# Patient Record
Sex: Female | Born: 1940 | Race: White | Hispanic: No | Marital: Married | Smoking: Never smoker
Health system: Southern US, Community
[De-identification: ages and names within clinical notes are randomized; demographics above are authoritative.]

## PROBLEM LIST (undated history)

## (undated) DIAGNOSIS — E119 Type 2 diabetes mellitus without complications: Secondary | ICD-10-CM

## (undated) DIAGNOSIS — E785 Hyperlipidemia, unspecified: Secondary | ICD-10-CM

## (undated) DIAGNOSIS — F325 Major depressive disorder, single episode, in full remission: Secondary | ICD-10-CM

## (undated) DIAGNOSIS — C801 Malignant (primary) neoplasm, unspecified: Secondary | ICD-10-CM

## (undated) HISTORY — DX: Hyperlipidemia, unspecified: E78.5

## (undated) HISTORY — DX: Type 2 diabetes mellitus without complications: E11.9

## (undated) HISTORY — DX: Major depressive disorder, single episode, in full remission: F32.5

---

## 2007-10-29 ENCOUNTER — Ambulatory Visit: Payer: Self-pay | Admitting: Internal Medicine

## 2007-12-17 ENCOUNTER — Encounter: Admission: RE | Admit: 2007-12-17 | Discharge: 2007-12-17 | Payer: Self-pay | Admitting: Internal Medicine

## 2008-01-09 ENCOUNTER — Ambulatory Visit: Payer: Self-pay | Admitting: Internal Medicine

## 2008-01-09 ENCOUNTER — Other Ambulatory Visit: Admission: RE | Admit: 2008-01-09 | Discharge: 2008-01-09 | Payer: Self-pay | Admitting: Internal Medicine

## 2008-01-09 ENCOUNTER — Encounter: Payer: Self-pay | Admitting: Internal Medicine

## 2008-01-09 DIAGNOSIS — M899 Disorder of bone, unspecified: Secondary | ICD-10-CM | POA: Insufficient documentation

## 2008-01-09 DIAGNOSIS — M949 Disorder of cartilage, unspecified: Secondary | ICD-10-CM

## 2008-12-26 ENCOUNTER — Encounter: Admission: RE | Admit: 2008-12-26 | Discharge: 2008-12-26 | Payer: Self-pay | Admitting: Internal Medicine

## 2009-01-05 ENCOUNTER — Ambulatory Visit: Payer: Self-pay | Admitting: Internal Medicine

## 2009-01-05 DIAGNOSIS — E785 Hyperlipidemia, unspecified: Secondary | ICD-10-CM

## 2009-01-05 DIAGNOSIS — T887XXA Unspecified adverse effect of drug or medicament, initial encounter: Secondary | ICD-10-CM | POA: Insufficient documentation

## 2009-01-05 HISTORY — DX: Hyperlipidemia, unspecified: E78.5

## 2009-01-05 LAB — CONVERTED CEMR LAB: Vit D, 25-Hydroxy: 49 ng/mL (ref 30–89)

## 2009-01-07 LAB — CONVERTED CEMR LAB
ALT: 32 units/L (ref 0–35)
Alkaline Phosphatase: 71 units/L (ref 39–117)
Basophils Relative: 0.6 % (ref 0.0–3.0)
CO2: 26 meq/L (ref 19–32)
Calcium: 9.5 mg/dL (ref 8.4–10.5)
Chloride: 104 meq/L (ref 96–112)
Cholesterol: 256 mg/dL — ABNORMAL HIGH (ref 0–200)
Creatinine, Ser: 0.9 mg/dL (ref 0.4–1.2)
Eosinophils Absolute: 0.3 10*3/uL (ref 0.0–0.7)
Lymphocytes Relative: 33.2 % (ref 12.0–46.0)
Lymphs Abs: 2.2 10*3/uL (ref 0.7–4.0)
MCHC: 34.4 g/dL (ref 30.0–36.0)
Monocytes Relative: 9.1 % (ref 3.0–12.0)
Neutro Abs: 3.5 10*3/uL (ref 1.4–7.7)
Neutrophils Relative %: 53.2 % (ref 43.0–77.0)
RBC: 4.63 M/uL (ref 3.87–5.11)
RDW: 12 % (ref 11.5–14.6)
Sodium: 142 meq/L (ref 135–145)
Total Bilirubin: 1.1 mg/dL (ref 0.3–1.2)
Total CHOL/HDL Ratio: 5
Total Protein: 7.3 g/dL (ref 6.0–8.3)
VLDL: 29.6 mg/dL (ref 0.0–40.0)
WBC: 6.6 10*3/uL (ref 4.5–10.5)

## 2009-01-14 ENCOUNTER — Ambulatory Visit: Payer: Self-pay | Admitting: Internal Medicine

## 2009-01-28 ENCOUNTER — Ambulatory Visit: Payer: Self-pay | Admitting: Internal Medicine

## 2009-07-27 ENCOUNTER — Encounter: Payer: Self-pay | Admitting: Internal Medicine

## 2009-09-08 ENCOUNTER — Encounter: Payer: Self-pay | Admitting: Internal Medicine

## 2009-11-17 ENCOUNTER — Telehealth: Payer: Self-pay | Admitting: Internal Medicine

## 2010-01-01 ENCOUNTER — Ambulatory Visit: Payer: Self-pay | Admitting: Internal Medicine

## 2010-01-01 LAB — CONVERTED CEMR LAB
Basophils Absolute: 0 10*3/uL (ref 0.0–0.1)
Bilirubin, Direct: 0.1 mg/dL (ref 0.0–0.3)
Cholesterol: 254 mg/dL — ABNORMAL HIGH (ref 0–200)
Direct LDL: 162.3 mg/dL
Glucose, Bld: 122 mg/dL — ABNORMAL HIGH (ref 70–99)
HCT: 43 % (ref 36.0–46.0)
MCV: 92.2 fL (ref 78.0–100.0)
Neutro Abs: 3.9 10*3/uL (ref 1.4–7.7)
Neutrophils Relative %: 55 % (ref 43.0–77.0)
Platelets: 240 10*3/uL (ref 150.0–400.0)
Potassium: 5 meq/L (ref 3.5–5.1)
RBC: 4.67 M/uL (ref 3.87–5.11)
RDW: 12.3 % (ref 11.5–14.6)
Triglycerides: 247 mg/dL — ABNORMAL HIGH (ref 0.0–149.0)
Vit D, 25-Hydroxy: 39 ng/mL (ref 30–89)

## 2010-04-25 LAB — CONVERTED CEMR LAB
ALT: 39 units/L — ABNORMAL HIGH (ref 0–35)
AST: 40 units/L — ABNORMAL HIGH (ref 0–37)
Albumin: 3.9 g/dL (ref 3.5–5.2)
Alkaline Phosphatase: 70 units/L (ref 39–117)
BUN: 14 mg/dL (ref 6–23)
Basophils Absolute: 0 10*3/uL (ref 0.0–0.1)
Basophils Relative: 0.6 % (ref 0.0–3.0)
Bilirubin, Direct: 0.1 mg/dL (ref 0.0–0.3)
CO2: 30 meq/L (ref 19–32)
Calcium: 9.7 mg/dL (ref 8.4–10.5)
Chloride: 104 meq/L (ref 96–112)
Cholesterol: 259 mg/dL (ref 0–200)
Creatinine, Ser: 0.8 mg/dL (ref 0.4–1.2)
Direct LDL: 162.6 mg/dL
Eosinophils Absolute: 0.3 10*3/uL (ref 0.0–0.7)
Eosinophils Relative: 3.8 % (ref 0.0–5.0)
GFR calc Af Amer: 92 mL/min
GFR calc non Af Amer: 76 mL/min
Glucose, Bld: 105 mg/dL — ABNORMAL HIGH (ref 70–99)
HCT: 45.7 % (ref 36.0–46.0)
HDL: 49.7 mg/dL (ref >39.0)
Hemoglobin: 15.5 g/dL — ABNORMAL HIGH (ref 12.0–15.0)
Lymphocytes Relative: 32.9 % (ref 12.0–46.0)
MCHC: 33.8 g/dL (ref 30.0–36.0)
MCV: 92.5 fL (ref 78.0–100.0)
Monocytes Absolute: 0.8 10*3/uL (ref 0.1–1.0)
Monocytes Relative: 9.7 % (ref 3.0–12.0)
Neutro Abs: 4.4 10*3/uL (ref 1.4–7.7)
Neutrophils Relative %: 53 % (ref 43.0–77.0)
Platelets: 236 10*3/uL (ref 150–400)
Potassium: 4.7 meq/L (ref 3.5–5.1)
RBC: 4.94 M/uL (ref 3.87–5.11)
RDW: 12.4 % (ref 11.5–14.6)
Sodium: 142 meq/L (ref 135–145)
TSH: 2.64 microintl units/mL (ref 0.35–5.50)
Total Bilirubin: 0.9 mg/dL (ref 0.3–1.2)
Total CHOL/HDL Ratio: 5.2
Total Protein: 7.7 g/dL (ref 6.0–8.3)
Triglycerides: 167 mg/dL — ABNORMAL HIGH (ref 0–149)
VLDL: 33 mg/dL (ref 0–40)
WBC: 8.2 10*3/uL (ref 4.5–10.5)

## 2010-04-27 NOTE — Assessment & Plan Note (Signed)
Summary: CPX (PT WILL COME IN FASTING) // RS   Vital Signs:  Patient profile:   70 year old female Height:      62 inches Weight:      138 pounds BMI:     25.33 Temp:     98.2 degrees F oral Pulse rate:   72 / minute Resp:     14 per minute BP sitting:   110 / 80  (left arm)  Vitals Entered By: Willy Eddy, LPN (January 01, 2010 10:08 AM) CC: ANNUAL VISIT FOR DISEASE MANAGEMENT-FASTING THIS AM Is Patient Diabetic? No   Primary Care Sona Nations:  Stacie Glaze MD  CC:  ANNUAL VISIT FOR DISEASE MANAGEMENT-FASTING THIS AM.  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 services that are appropriate to their age and was given guidance on diet exercize  and weight management  I have personally reviewed the Medicare Annual Wellness questionnaire and have noted 1.   The patient's medical and social history 2.   Their use of alcohol, tobacco or illicit drugs 3.   Their current medications and supplements 4.   The patient's functional ability including ADL's, fall risks, home safety risks and hearing or visual             impairment. 5.   Diet and physical activities 6.   Evidence for depression or mood disorders The patients weight, height, BMI and visual acuity have been recorded in the chart I have made referrals, counseling and provided education to the patient based review of the above and I have provided the pt with a written personalized care plan for preventive services.      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 1 Once Daily 2)  B-12 250 Mcg  Tabs (Cyanocobalamin) .Marland Kitchen.. 1 Once Daily 3)  Fish Oil 1200 Mg  Caps (Omega-3 Fatty Acids) .Marland Kitchen.. 1 Once Daily 4)  Biotin 5000 5 Mg Caps (Biotin) .... 2500 Once Daily 5)  Bayer Aspirin Ec Low Dose 81 Mg Tbec (Aspirin) .Marland Kitchen.. 1 Once Daily 6)  Sleep Aide .... Q Hs As Needed 7)  Multivitamins  Caps (Multiple Vitamin) .Marland Kitchen.. 1 Once Daily 8)  Vitamin B-12 100 Mcg Tabs (Cyanocobalamin) .Marland Kitchen.. 1 Once Daily 9)  Caltrate 600+d Plus 600-400 Mg-Unit Tabs (Calcium Carbonate-Vit D-Min) .... One Two Times A Day 10)  Vitamin D 1000 Unit Tabs (Cholecalciferol) .... One By Mouth Daily  Current Medications (verified): 1)  B-100 Complex   Tabs (Vitamins-Lipotropics) .Marland Kitchen.. 1 Once Daily 2)  B-12 250 Mcg  Tabs (Cyanocobalamin) .Marland Kitchen.. 1 Once Daily 3)  Fish Oil 1200 Mg  Caps (Omega-3 Fatty Acids) .Marland Kitchen.. 1 Once Daily 4)  Biotin 5000 5 Mg Caps (Biotin) .... 2500 Once Daily 5)  Bayer Aspirin Ec Low Dose 81 Mg Tbec (Aspirin) .Marland Kitchen.. 1 Once Daily 6)  Sleep Aide .... Q Hs As Needed 7)  Multivitamins  Caps (Multiple Vitamin) .Marland Kitchen.. 1 Once Daily 8)  Vitamin B-12 100 Mcg Tabs (Cyanocobalamin) .Marland Kitchen.. 1 Once Daily 9)  Caltrate 600+d Plus 600-400 Mg-Unit Tabs (Calcium Carbonate-Vit D-Min) .... One Two Times A Day 10)  Vitamin D 1000 Unit Tabs (Cholecalciferol) .... One By Mouth Daily  Allergies (verified): No Known Drug Allergies  Past History:  Family History: Last updated: 10/29/2007 Family History of Arthritis Family History Breast cancer 1st  degree relative <50 Family History Hypertension  Social History: Last updated: 10/29/2007 Occupation:homebound teacher Never Smoked Alcohol use-yes Married  Risk Factors: Smoking Status: never (01/01/2010) Passive Smoke Exposure: no (01/01/2010)  Past medical, surgical, family and social histories (including risk factors) reviewed, and no changes noted (except as noted below).  Past Medical History: Reviewed history from 10/29/2007 and no changes required. Unremarkable  Past Surgical History: Reviewed history from 10/29/2007 and no changes required. Tubal ligation Tonsillectomy  Family History: Reviewed history from 10/29/2007 and no changes required. Family History of Arthritis Family History Breast cancer 1st degree relative <50 Family History Hypertension  Social History: Reviewed history from 10/29/2007 and no changes required. Occupation:homebound teacher Never Smoked Alcohol use-yes Married  Review of Systems  The patient denies anorexia, fever, weight loss, weight gain, vision loss, decreased hearing, hoarseness, chest pain, syncope, dyspnea on exertion, peripheral edema, prolonged cough, headaches, hemoptysis, abdominal pain, melena, hematochezia, severe indigestion/heartburn, hematuria, incontinence, genital sores, muscle weakness, suspicious skin lesions, transient blindness, difficulty walking, depression, unusual weight change, abnormal bleeding, enlarged lymph nodes, angioedema, breast masses, and testicular masses.         Flu Vaccine Consent Questions     Do you have a history of severe allergic reactions to this vaccine? no    Any prior history of allergic reactions to egg and/or gelatin? no    Do you have a sensitivity to the preservative Thimersol? no    Do you have a past history of Guillan-Barre Syndrome? no    Do you currently have an acute febrile illness? no    Have you ever had a severe reaction to latex? no    Vaccine information given and  explained to patient? yes    Are you currently pregnant? no    Lot Number:[id number]   Exp Date:09/25/2010   Site Given  Left Deltoid IM     Physical Exam  General:  Well-developed,well-nourished,in no acute distress; alert,appropriate and cooperative throughout examination Head:  normocephalic and atraumatic.   Eyes:  pupils equal and pupils reactive to light.   Ears:  R ear normal and L ear normal.   Nose:  no external deformity and no nasal discharge.   Mouth:  good dentition and no dental plaque.   Neck:  No deformities, masses, or tenderness noted. Breasts:  skin/areolae normal, no masses, and no abnormal thickening.   Lungs:  Normal respiratory effort, chest expands symmetrically. Lungs are clear to auscultation, no crackles or wheezes. Heart:  normal rate and regular rhythm.   Abdomen:  soft and non-tender.     Impression & Recommendations:  Problem # 1:  PHYSICAL EXAMINATION (ICD-V70.0)  The pt was asked about all immunizations, health maint. services that are appropriate to their age and was  given guidance on diet exercize  and weight management  Mammogram: normal (12/29/2008) Pap smear: NEGATIVE FOR INTRAEPITHELIAL LESIONS OR MALIGNANCY. (01/09/2008) Colonoscopy: DONE (01/28/2009) Bone Density: abnormal (01/09/2008) Td Booster: Td (10/29/2007)   Flu Vax: Fluvax 3+ (01/01/2010)   Pneumovax: Pneumovax (Medicare) (01/01/2010) Chol: 256 (01/05/2009)   HDL: 52.30 (01/05/2009)   LDL: DEL (01/09/2008)   TG: 148.0 (01/05/2009) TSH: 2.64 (01/09/2008)   Next mammogram due:: 12/2009 (01/05/2009) Next Colonoscopy due:: 01/2019 (01/28/2009) Next Bone Density due:: 12/2009 (01/05/2009)  Discussed using sunscreen, use of alcohol, drug use, self breast exam, routine dental care, routine eye care, schedule for GYN exam, routine physical exam, seat belts, multiple vitamins, osteoporosis prevention, adequate calcium intake in diet, recommendations for immunizations, mammograms and Pap  smears.  Discussed exercise and checking cholesterol.  Discussed gun safety, safe sex, and contraception.  Orders: Medicare -1st Annual Wellness Visit [phone])  Problem # 2:  HYPERLIPIDEMIA, MILD (ICD-272.4) add kril oil Labs Reviewed: SGOT: 37 (01/05/2009)   SGPT: 32 (01/05/2009)   HDL:52.30 (01/05/2009), 49.7 (01/09/2008)  LDL:DEL (01/09/2008)  Chol:256 (01/05/2009), 259 (01/09/2008)  Trig:148.0 (01/05/2009), 167 (01/09/2008)  Orders: TLB-Lipid Panel (80061-LIPID)  Complete Medication List: 1)  B-100 Complex Tabs (Vitamins-lipotropics) .Marland Kitchen.. 1 once daily 2)  Fish Oil 1200 Mg Caps (Omega-3 fatty acids) .Marland Kitchen.. 1 once daily 3)  Biotin 5000 5 Mg Caps (Biotin) .... 2500 once daily 4)  Bayer Aspirin Ec Low Dose 81 Mg Tbec (Aspirin) .Marland Kitchen.. 1 once daily 5)  Sleep Aide  .... Q hs as needed 6)  Multivitamins Caps (Multiple vitamin) .Marland Kitchen.. 1 once daily 7)  Vitamin B-12 100 Mcg Tabs (Cyanocobalamin) .Marland Kitchen.. 1 once daily 8)  Caltrate 600+d Plus 600-400 Mg-unit Tabs (Calcium carbonate-vit d-min) .... One two times a day 9)  Vitamin D 1000 Unit Tabs (Cholecalciferol) .... One by mouth daily  Other Orders: Flu Vaccine 36yrs + MEDICARE PATIENTS [phone]) Administration Flu vaccine - MCR (M8413) Pneumococcal Vaccine (24401) Admin 1st Vaccine (02725) T-Bone Densitometry [phone]) TLB-CMP (Comprehensive Metabolic Pnl) (80053-COMP) T-Vitamin D (25-Hydroxy) (03474-25956) TLB-CBC Platelet - w/Differential (85025-CBCD)  Patient Instructions: 1)  I have given this pt her personalized plan ande placed a copy in the EMR. 2)  Please schedule a follow-up appointment in 1 year. for welness exam     Immunizations Administered:  Pneumonia Vaccine:    Vaccine Type: Pneumovax (Medicare)    Site: right deltoid    Mfr: Merck    Dose: 0.5 ml    Route: IM    Given by: Willy Eddy, LPN    Exp. Date: 04/18/2011    Lot #: [id number]    VIS given: 03/02/09 version given January 01, 2010.   Prevention & Chronic  Care Immunizations   Influenza vaccine: Fluvax 3+  (01/01/2010)   Influenza vaccine deferral: patient declined  (01/05/2009)   Influenza vaccine due: 11/27/2010    Tetanus booster: 10/29/2007: Td   Tetanus booster due: 10/28/2017    Pneumococcal vaccine: Pneumovax (Medicare)  (01/01/2010)   Pneumococcal vaccine deferral: patient declined  (01/05/2009)    H. zoster vaccine: Not documented    Immunization comments: pt to schedule zostra vix  Colorectal Screening   Hemoccult: Not documented    Colonoscopy: DONE  (01/28/2009)   Colonoscopy due: 01/2019  Other Screening   Pap smear: NEGATIVE FOR INTRAEPITHELIAL LESIONS OR MALIGNANCY.  (01/09/2008)   Pap smear due: 12/2010    Mammogram: normal  (12/29/2008)   Mammogram action/deferral: Ordered  (01/01/2010)   Mammogram due: 12/2009  DXA bone density scan: abnormal  (01/09/2008)   DXA bone density action/deferral: Ordered  (01/01/2010)   DXA scan due: 12/2009    Smoking status: never  (01/01/2010)  Lipids   Total Cholesterol: 256  (01/05/2009)   Lipid panel action/deferral: Lipid Panel ordered   LDL: DEL  (01/09/2008)   LDL Direct: 174.8  (01/05/2009)   HDL: 52.30  (01/05/2009)   Triglycerides: 148.0  (01/05/2009)    SGOT (AST): 37  (01/05/2009)   BMP action: Ordered   SGPT (ALT): 32  (01/05/2009) CMP ordered    Alkaline phosphatase: 71  (01/05/2009)   Total bilirubin: 1.1  (01/05/2009)    Lipid flowsheet reviewed?: Yes   Progress toward LDL goal: Improved  Self-Management Support :    Patient will work on the following items until the next clinic visit to reach self-care goals:     Medications and monitoring: take my medicines every day  (01/01/2010)     Eating: eat foods that are low in salt, eat fruit for snacks and desserts  (01/01/2010)     Activity: take a 30 minute walk every day  (01/01/2010)    Lipid self-management support: Lipid monitoring log  (01/01/2010)    Nursing Instructions: Schedule  screening mammogram (see order) Schedule screening DXA bone density scan (see order)   Appended Document: Orders Update    Clinical Lists Changes  Orders: Added new Service order of Venipuncture (16109) - Signed Added new Service order of Specimen Handling (60454) - Signed Added new Test order of TLB-BMP (Basic Metabolic Panel-BMET) (80048-METABOL) - Signed Added new Test order of TLB-Hepatic/Liver Function Pnl (80076-HEPATIC) - Signed

## 2010-04-27 NOTE — Progress Notes (Signed)
Summary: REQ FOR AM CPX APPT  Phone Note Call from Patient   Caller: Patient Summary of Call: Pt called in for CPX appt that she normally has done in October..... However, there are no morning appts and pt will need to come in fasting for labwork (Medicare).... Can you advise an available morning appt in October that pt can come in for cpx/labwrk?   Pt can be reached at [phone].  Initial call taken by: Debbra Riding,  November 17, 2009 1:49 PM  Follow-up for Phone Call        you have 2 choices,- she can have 10-7 at 9:45 and 10am(block 30 minutes) or 11/3 at 9:15 and 9;45(block those 2 spaces for 30 minutes Follow-up by: Willy Eddy, LPN,  November 17, 2009 2:14 PM  Additional Follow-up for Phone Call Additional follow up Details #1::        Spoke with pt and appt was scheduled for 10/7 at 9:45am..... following slot blocked per Rushie Goltz, LPN.  Additional Follow-up by: Debbra Riding,  November 17, 2009 2:44 PM

## 2010-04-27 NOTE — Letter (Signed)
Summary: Medicare Wellness Questionnaire  Medicare Wellness Questionnaire   Imported By: Georgian Co 01/01/2010 11:15:02  _____________________________________________________________________  External Attachment:    Type:   Image     Comment:   External Document

## 2012-05-24 ENCOUNTER — Telehealth: Payer: Self-pay | Admitting: Internal Medicine

## 2012-05-24 ENCOUNTER — Encounter: Payer: Self-pay | Admitting: Family Medicine

## 2012-05-24 ENCOUNTER — Ambulatory Visit (INDEPENDENT_AMBULATORY_CARE_PROVIDER_SITE_OTHER): Payer: Medicare Other | Admitting: Family Medicine

## 2012-05-24 VITALS — BP 122/70 | HR 100 | Temp 97.6°F | Wt 136.0 lb

## 2012-05-24 DIAGNOSIS — H60399 Other infective otitis externa, unspecified ear: Secondary | ICD-10-CM

## 2012-05-24 DIAGNOSIS — H60392 Other infective otitis externa, left ear: Secondary | ICD-10-CM

## 2012-05-24 MED ORDER — NEOMYCIN-POLYMYXIN-HC 3.5-10000-1 OT SOLN: 5.0000 [drp] | Freq: Four times a day (QID) | OTIC | Status: DC

## 2012-05-24 NOTE — Telephone Encounter (Signed)
[facility] Triage Call Report Triage Record Num: [id number] Operator: Ether Griffins Patient Name: Gabrielle Robinson Call Date & Time: 05/23/2012 5:19:41PM Patient Phone: [phone] PCP: Darryll Capers Patient Gender: Female PCP Fax : [phone] Patient DOB: 1940-08-19 [facility] Name[facility] Reason for Call: Caller: [name]/Patient; PCP: Darryll Capers (Adults only); CB#: [phone]; Calling about possible ear infection--tenderness in left ear/face,ear feels "stopped up". Onset 05/23/12. Taking Acetaminophen with good effect. Guideline: ZDG:LOVFIEPP. Disposition: See Provider Within 24 Hours. Reason for Disposition: Constant or intermittent dull earache,throbbing in ear or feeling of fullness;may interfere with sleep or ability to carry out normal activities. Appt scheduled on 05/24/12 @ 1030 with Dr Clent Ridges. Home care advice given. Protocol(s) Used: Ear: Symptoms Recommended Outcome per Protocol: See Provider within 24 hours Reason for Outcome: Constant or intermittent dull earache, throbbing in ear(s) or feeling of fullness; may interfere with sleep or ability to carry out normal activities Care Advice: A warm washcloth or heating pad set on low to the affected ear may help relieve the discomfort. May apply for 15 to 20 minutes, 3 to 4 times a day. ~ Resting or sleeping with head elevated, such as semi-reclining in a recliner, may help reduce inner ear pressure and discomfort. ~ ~ SYMPTOM / CONDITION MANAGEMENT ~ CAUTIONS Analgesic/Antipyretic Advice - Acetaminophen: Consider acetaminophen as directed on label or by pharmacist/provider for pain or fever PRECAUTIONS: - Use if there is no history of liver disease, alcoholism, or intake of three or more alcohol drinks per day - Only if approved by provider during pregnancy or when breastfeeding - During pregnancy, acetaminophen should not be taken more than 3 consecutive days without telling provider - Do not exceed recommended dose  or frequency ~ Speak with provider as soon as possible if having: - discharge from ear that does not look like earwax or that has bad odor - increased redness or swelling of external ear - worsening pain - new or worsening dizziness - or unsteadiness. ~

## 2012-05-24 NOTE — Progress Notes (Signed)
  Subjective:    Patient ID: Gabrielle Robinson, female    DOB: 1941/02/27, 72 y.o.   MRN: [id number]  HPI Here for 2 days of pain in the left ear. No other URI symptoms. No recent swimming.    Review of Systems  Constitutional: Negative.   HENT: Positive for ear pain. Negative for hearing loss, congestion, neck pain, postnasal drip, sinus pressure and ear discharge.   Eyes: Negative.   Respiratory: Negative.        Objective:   Physical Exam  Constitutional: She appears well-developed and well-nourished.  HENT:  Head: Normocephalic and atraumatic.  Right Ear: External ear normal.  Mouth/Throat: Oropharynx is clear and moist. No oropharyngeal exudate.  Right ear canal is red and swollen, the TM is clear   Eyes: Conjunctivae are normal.  Neck: No thyromegaly present.  Lymphadenopathy:    She has no cervical adenopathy.          Assessment & Plan:  Use Cortisporin drops prn, keep water out of the ear, try warm compresses

## 2012-09-27 ENCOUNTER — Telehealth: Payer: Self-pay | Admitting: Internal Medicine

## 2012-09-27 NOTE — Telephone Encounter (Signed)
OnCall note: Called by NP with [employer] - wanted to let Dr. Lovell Sheehan know that pt had a U/A with 2+ Glucose and she has a FH with DM.

## 2012-10-01 NOTE — Telephone Encounter (Signed)
Need to be see for DM scree may appoint with [facility]

## 2012-10-01 NOTE — Telephone Encounter (Signed)
Talked with husband and asked him to have pt call and maek aov with [facility] per dr Lovell Sheehan

## 2013-03-18 ENCOUNTER — Encounter: Payer: Medicare Other | Admitting: Family Medicine

## 2013-03-18 ENCOUNTER — Encounter: Payer: Self-pay | Admitting: Family Medicine

## 2013-03-18 ENCOUNTER — Ambulatory Visit (INDEPENDENT_AMBULATORY_CARE_PROVIDER_SITE_OTHER): Payer: Medicare Other | Admitting: Family Medicine

## 2013-03-18 VITALS — BP 120/60 | HR 98 | Temp 99.0°F | Wt 130.0 lb

## 2013-03-18 DIAGNOSIS — R7309 Other abnormal glucose: Secondary | ICD-10-CM

## 2013-03-18 DIAGNOSIS — R739 Hyperglycemia, unspecified: Secondary | ICD-10-CM

## 2013-03-18 DIAGNOSIS — E785 Hyperlipidemia, unspecified: Secondary | ICD-10-CM

## 2013-03-18 LAB — BASIC METABOLIC PANEL
CO2: 29 mEq/L (ref 19–32)
Calcium: 9.7 mg/dL (ref 8.4–10.5)
Chloride: 98 mEq/L (ref 96–112)
Creatinine, Ser: 0.8 mg/dL (ref 0.4–1.2)
GFR: 73.58 mL/min (ref >60.00)
Glucose, Bld: 264 mg/dL — ABNORMAL HIGH (ref 70–99)
Sodium: 136 mEq/L (ref 135–145)

## 2013-03-18 LAB — CBC WITH DIFFERENTIAL/PLATELET
Eosinophils Absolute: 0.1 10*3/uL (ref 0.0–0.7)
Eosinophils Relative: 2 % (ref 0.0–5.0)
MCV: 90.4 fl (ref 78.0–100.0)
Monocytes Absolute: 0.8 10*3/uL (ref 0.1–1.0)
Platelets: 258 10*3/uL (ref 150.0–400.0)
RBC: 5.21 Mil/uL — ABNORMAL HIGH (ref 3.87–5.11)
WBC: 7.3 10*3/uL (ref 4.5–10.5)

## 2013-03-18 LAB — POCT URINALYSIS DIPSTICK
Bilirubin, UA: NEGATIVE
Leukocytes, UA: NEGATIVE
Nitrite, UA: NEGATIVE
Protein, UA: NEGATIVE
pH, UA: 5.5

## 2013-03-18 LAB — HEPATIC FUNCTION PANEL
AST: 35 U/L (ref 0–37)
Alkaline Phosphatase: 79 U/L (ref 39–117)
Bilirubin, Direct: 0 mg/dL (ref 0.0–0.3)
Total Bilirubin: 0.6 mg/dL (ref 0.3–1.2)
Total Protein: 7.7 g/dL (ref 6.0–8.3)

## 2013-03-18 LAB — TSH: TSH: 2.49 u[IU]/mL (ref 0.35–5.50)

## 2013-03-18 NOTE — Progress Notes (Signed)
   Subjective:    Patient ID: Gabrielle Robinson, female    DOB: 1941-02-09, 72 y.o.   MRN: [id number]  HPI Here to check if she has diabetes. She feels fine but does note to have a little more thirst lately than usual. No weight changes. A nurse from her insurance company recently saw her at the house and did a fingerstick glucose that she said was "high". She did not tell Gabrielle Robinson what the result was.    Review of Systems  Constitutional: Negative.   Respiratory: Negative.   Cardiovascular: Negative.   Neurological: Negative.        Objective:   Physical Exam  Constitutional: She appears well-developed and well-nourished.  Cardiovascular: Normal rate, regular rhythm, normal heart sounds and intact distal pulses.   Pulmonary/Chest: Effort normal and breath sounds normal.          Assessment & Plan:  Possible diabetes. Get labs today including an A1c.

## 2013-03-19 MED ORDER — METFORMIN HCL 500 MG PO TABS: 500.0000 mg | ORAL_TABLET | Freq: Two times a day (BID) | ORAL | Status: DC

## 2013-03-19 NOTE — Addendum Note (Signed)
Addended by: Aniceto Boss A on: 03/19/2013 01:29 PM   Modules accepted: Orders

## 2013-04-01 ENCOUNTER — Encounter: Payer: Self-pay | Admitting: Internal Medicine

## 2013-04-01 ENCOUNTER — Ambulatory Visit (INDEPENDENT_AMBULATORY_CARE_PROVIDER_SITE_OTHER): Payer: Medicare Other | Admitting: Internal Medicine

## 2013-04-01 VITALS — BP 124/80 | HR 76 | Temp 97.7°F | Resp 16 | Ht 62.0 in | Wt 130.0 lb

## 2013-04-01 DIAGNOSIS — E119 Type 2 diabetes mellitus without complications: Secondary | ICD-10-CM | POA: Insufficient documentation

## 2013-04-01 DIAGNOSIS — E1165 Type 2 diabetes mellitus with hyperglycemia: Secondary | ICD-10-CM

## 2013-04-01 DIAGNOSIS — E1169 Type 2 diabetes mellitus with other specified complication: Principal | ICD-10-CM

## 2013-04-01 DIAGNOSIS — IMO0002 Reserved for concepts with insufficient information to code with codable children: Secondary | ICD-10-CM

## 2013-04-01 MED ORDER — SAXAGLIPTIN-METFORMIN ER 5-500 MG PO TB24: 1.0000 | ORAL_TABLET | Freq: Every day | ORAL | Status: DC

## 2013-04-01 MED ORDER — GLUCOSE BLOOD VI STRP: ORAL_STRIP | Status: DC

## 2013-04-01 NOTE — Progress Notes (Signed)
   Subjective:    Patient ID: Gabrielle Robinson, female    DOB: Mar 24, 1941, 73 y.o.   MRN: [id number]  HPI New diagnosis of DM Has lost weight No hypoglycemia noted Started metformin and has had diarhea     Review of Systems  Constitutional: Negative for activity change, appetite change and fatigue.  HENT: Negative for congestion, ear pain, postnasal drip and sinus pressure.   Eyes: Negative for redness and visual disturbance.  Respiratory: Negative for cough, shortness of breath and wheezing.   Gastrointestinal: Negative for abdominal pain and abdominal distention.  Genitourinary: Negative for dysuria, frequency and menstrual problem.  Musculoskeletal: Negative for arthralgias, joint swelling, myalgias and neck pain.  Skin: Negative for rash and wound.  Neurological: Negative for dizziness, weakness and headaches.  Hematological: Negative for adenopathy. Does not bruise/bleed easily.  Psychiatric/Behavioral: Negative for sleep disturbance and decreased concentration.   History reviewed. No pertinent past medical history.  History   Social History  . Marital Status: Married    Spouse Name: N/A    Number of Children: N/A  . Years of Education: N/A   Occupational History  . Not on file.   Social History Main Topics  . Smoking status: Never Smoker   . Smokeless tobacco: Never Used  . Alcohol Use: Yes     Comment: occ  . Drug Use: No  . Sexual Activity: Not on file   Other Topics Concern  . Not on file   Social History Narrative  . No narrative on file    History reviewed. No pertinent past surgical history.  No family history on file.  No Known Allergies  Current Outpatient Prescriptions on File Prior to Visit  Medication Sig Dispense Refill  . neomycin-polymyxin-hydrocortisone (CORTISPORIN) otic solution Place 5 drops into the left ear 4 (four) times daily.  10 mL  0   No current facility-administered medications on file prior to visit.    BP 124/80  Pulse  76  Temp(Src) 97.7 F (36.5 C)  Resp 16  Ht 5\' 2"  (1.575 m)  Wt 130 lb (58.968 kg)  BMI 23.77 kg/m2       Objective:   Physical Exam  Constitutional: She is oriented to person, place, and time. She appears well-developed and well-nourished.  HENT:  Head: Normocephalic and atraumatic.  Eyes: Conjunctivae are normal. Pupils are equal, round, and reactive to light.  Neck: Normal range of motion. Neck supple.  Cardiovascular: Normal rate and regular rhythm.   Pulmonary/Chest: Breath sounds normal.  Abdominal: Soft. Bowel sounds are normal.  Musculoskeletal: Normal range of motion.  Neurological: She is alert and oriented to person, place, and time.          Assessment & Plan:  New-onset adult-onset diabetes with an initial hemoglobin A1c of 9.8.  She started metformin 500 mg by mouth twice daily tolerated the medication generally well but did have some loose stools.  We have changed her to controlled release metformin in combination, kombiglyze 5/500 We have given her a One Touch glucometer A referral to nutritional support services Scheduled an A1c prior to one month return to see our nurse practitioner

## 2013-04-01 NOTE — Progress Notes (Signed)
Pre visit review using our clinic review tool, if applicable. No additional management support is needed unless otherwise documented below in the visit note. 

## 2013-04-11 ENCOUNTER — Encounter: Payer: Medicare Other | Attending: Internal Medicine

## 2013-04-11 VITALS — Ht 64.0 in | Wt 128.8 lb

## 2013-04-11 DIAGNOSIS — IMO0002 Reserved for concepts with insufficient information to code with codable children: Secondary | ICD-10-CM | POA: Insufficient documentation

## 2013-04-11 DIAGNOSIS — Z713 Dietary counseling and surveillance: Secondary | ICD-10-CM | POA: Insufficient documentation

## 2013-04-11 DIAGNOSIS — E1169 Type 2 diabetes mellitus with other specified complication: Secondary | ICD-10-CM

## 2013-04-11 DIAGNOSIS — E118 Type 2 diabetes mellitus with unspecified complications: Principal | ICD-10-CM

## 2013-04-11 DIAGNOSIS — E1165 Type 2 diabetes mellitus with hyperglycemia: Secondary | ICD-10-CM | POA: Insufficient documentation

## 2013-04-13 NOTE — Progress Notes (Signed)
Patient was seen on 04/11/13 for the first of a series of three diabetes self-management courses at the [facility].  Current HbA1c: 9.8%  The following learning objectives were met by the patient during this class:  Describe diabetes  State some common risk factors for diabetes  Defines the role of glucose and insulin  Identifies type of diabetes and pathophysiology  Describe the relationship between diabetes and cardiovascular risk  State the members of the Healthcare Team  States the rationale for glucose monitoring  State when to test glucose  State their individual Target Range  State the importance of logging glucose readings  Describe how to interpret glucose readings  Identifies A1C target  Explain the correlation between A1c and eAG values  State symptoms and treatment of high blood glucose  State symptoms and treatment of low blood glucose  Explain proper technique for glucose testing  Identifies proper sharps disposal  Handouts given during class include:  Living Well with Diabetes book  Carb Counting and Meal Planning book  Meal Plan Card  Carbohydrate guide  Meal planning worksheet  Low Sodium Flavoring Tips  The diabetes portion plate  Low Carbohydrate Snack Suggestions  A1c to eAG Conversion Chart  Diabetes Medications  Stress Management  Diabetes Recommended Care Schedule  Diabetes Success Plan  Core Class Satisfaction Survey  Follow-Up Plan:  Attend core 2

## 2013-04-16 ENCOUNTER — Telehealth: Payer: Self-pay | Admitting: Internal Medicine

## 2013-04-16 NOTE — Telephone Encounter (Signed)
Patient Information:  Caller Name: Gwyndolyn Saxon  Phone: [phone]  Patient: Gabrielle Robinson, Gabrielle Robinson  Gender: Female  DOB: 1940/06/13  Age: 73 Years  PCP: Benay Pillow (Adults only)  Office Follow Up:  Does the office need to follow up with this patient?: Yes  Instructions For The Office: Tylenol 650mg  recommended now for fever.  No appt available today at office or [facility].  PLEASE CONTACT FOR APPT.  RN Note:  Tylenol 650mg  recommended now for fever.  No appt available today at office or [facility].  PLEASE CONTACT FOR APPT.  Symptoms  Reason For Call & Symptoms: Husband states that Shalini is sick.  Onset of illness on Sunday 04/14/13.  +cough non productive  , +sneezing , Fever -102.0 (o). No vomiting/no diarrhea. No headache, no body aches. No exposure to flu.  +flu Immunization.  She is drinking water.  She has not treated fever at this time.  Reviewed Health History In EMR: Yes  Reviewed Medications In EMR: Yes  Reviewed Allergies In EMR: Yes  Reviewed Surgeries / Procedures: Yes  Date of Onset of Symptoms: 04/14/2013  Treatments Tried: Delsym , Dimetapp  Treatments Tried Worked: No  Any Fever: Yes  Fever Taken: Oral  Fever Time Of Reading: 15:15:00  Fever Last Reading: 102.0  Guideline(s) Used:  Cough  Disposition Per Guideline:   Go to Office Now  Reason For Disposition Reached:   Fever > 100.5 F (38.1 C) and over 22 years of age  Advice Given:  Reassurance  Coughing is the way that our lungs remove irritants and mucus. It helps protect our lungs from getting pneumonia.  Coughing Spasms:  Drink warm fluids. Inhale warm mist (Reason: both relax the airway and loosen up the phlegm).  Suck on cough drops or hard candy to coat the irritated throat.  Prevent Dehydration:  Drink adequate liquids.  This will help soothe an irritated or dry throat and loosen up the phlegm.  Avoid Tobacco Smoke:  Smoking or being exposed to smoke makes coughs much worse.  Fever  Medicines:  For fevers above 101 F (38.3 C) take either acetaminophen or ibuprofen.  Acetaminophen (e.g., Tylenol):  Regular Strength Tylenol: Take 650 mg (two 325 mg pills) by mouth every 4-6 hours as needed. Each Regular Strength Tylenol pill has 325 mg of acetaminophen.  Extra Strength Tylenol: Take 1,000 mg (two 500 mg pills) every 8 hours as needed. Each Extra Strength Tylenol pill has 500 mg of acetaminophen.  Ibuprofen (e.g., Motrin, Advil):  Take 400 mg (two 200 mg pills) by mouth every 6 hours.  RN Overrode Recommendation:  Make Appointment  Tylenol 650mg  recommended now for fever.  No appt available today at office or [facility].  PLEASE CONTACT FOR APPT.

## 2013-04-16 NOTE — Telephone Encounter (Signed)
Ov tomorrow 

## 2013-04-17 ENCOUNTER — Encounter: Payer: Self-pay | Admitting: Internal Medicine

## 2013-04-17 ENCOUNTER — Ambulatory Visit (INDEPENDENT_AMBULATORY_CARE_PROVIDER_SITE_OTHER): Payer: Medicare Other | Admitting: Internal Medicine

## 2013-04-17 VITALS — BP 120/72 | HR 100 | Temp 100.2°F | Resp 20 | Ht 64.0 in | Wt 131.0 lb

## 2013-04-17 DIAGNOSIS — IMO0002 Reserved for concepts with insufficient information to code with codable children: Secondary | ICD-10-CM

## 2013-04-17 DIAGNOSIS — J069 Acute upper respiratory infection, unspecified: Secondary | ICD-10-CM

## 2013-04-17 DIAGNOSIS — B9789 Other viral agents as the cause of diseases classified elsewhere: Secondary | ICD-10-CM

## 2013-04-17 DIAGNOSIS — E1165 Type 2 diabetes mellitus with hyperglycemia: Secondary | ICD-10-CM

## 2013-04-17 DIAGNOSIS — E1169 Type 2 diabetes mellitus with other specified complication: Principal | ICD-10-CM

## 2013-04-17 NOTE — Patient Instructions (Signed)
Acute bronchitis symptoms for less than 10 days are generally not helped by antibiotics.  Take over-the-counter expectorants and cough medications such as  Mucinex DM.  Call if there is no improvement in 5 to 7 days or if he developed worsening cough, fever, or new symptoms, such as shortness of breath or chest pain.    

## 2013-04-17 NOTE — Progress Notes (Signed)
Subjective:    Patient ID: Gabrielle Robinson, female    DOB: 1940-11-26, 73 y.o.   MRN: [id number]  HPI   73 year old patient who presents with a three-day history of fever mild cough congestion and sinus fullness. She states fever has been as high as 103 but has always normalized with Tylenol. She has been using OTC medications with some benefit. She has type 2 diabetes.  Past Medical History  Diagnosis Date  . Diabetes mellitus without complication     History   Social History  . Marital Status: Married    Spouse Name: N/A    Number of Children: N/A  . Years of Education: N/A   Occupational History  . Not on file.   Social History Main Topics  . Smoking status: Never Smoker   . Smokeless tobacco: Never Used  . Alcohol Use: Yes     Comment: occ  . Drug Use: No  . Sexual Activity: Not on file   Other Topics Concern  . Not on file   Social History Narrative  . No narrative on file    History reviewed. No pertinent past surgical history.  No family history on file.  No Known Allergies  Current Outpatient Prescriptions on File Prior to Visit  Medication Sig Dispense Refill  . glucose blood (ONETOUCH VERIO) test strip Use as instructed  100 each  12  . neomycin-polymyxin-hydrocortisone (CORTISPORIN) otic solution Place 5 drops into the left ear 4 (four) times daily.  10 mL  0  . Saxagliptin-Metformin (KOMBIGLYZE XR) 5-500 MG TB24 Take 1 tablet by mouth daily after breakfast.  30 tablet  11   No current facility-administered medications on file prior to visit.    BP 120/72  Pulse 100  Temp(Src) 100.2 F (37.9 C) (Oral)  Resp 20  Ht 5\' 4"  (1.626 m)  Wt 131 lb (59.421 kg)  BMI 22.47 kg/m2  SpO2 95%       Review of Systems  Constitutional: Positive for fever, activity change, appetite change and fatigue. Negative for chills.  HENT: Positive for postnasal drip, rhinorrhea and sinus pressure. Negative for congestion, dental problem, hearing loss, sore throat  and tinnitus.   Eyes: Negative for pain, discharge and visual disturbance.  Respiratory: Positive for cough. Negative for shortness of breath and wheezing.   Cardiovascular: Negative for chest pain, palpitations and leg swelling.  Gastrointestinal: Negative for nausea, vomiting, abdominal pain, diarrhea, constipation, blood in stool and abdominal distention.  Genitourinary: Negative for dysuria, urgency, frequency, hematuria, flank pain, vaginal bleeding, vaginal discharge, difficulty urinating, vaginal pain and pelvic pain.  Musculoskeletal: Negative for arthralgias, gait problem and joint swelling.  Skin: Negative for rash.  Neurological: Negative for dizziness, syncope, speech difficulty, weakness, numbness and headaches.  Hematological: Negative for adenopathy.  Psychiatric/Behavioral: Negative for behavioral problems, dysphoric mood and agitation. The patient is not nervous/anxious.        Objective:   Physical Exam  Constitutional: She is oriented to person, place, and time. She appears well-developed and well-nourished.  HENT:  Head: Normocephalic.  Right Ear: External ear normal.  Left Ear: External ear normal.  Mouth/Throat: Oropharynx is clear and moist.  Eyes: Conjunctivae and EOM are normal. Pupils are equal, round, and reactive to light.  Neck: Normal range of motion. Neck supple. No thyromegaly present.  Cardiovascular: Normal rate, regular rhythm, normal heart sounds and intact distal pulses.   Pulmonary/Chest: Effort normal and breath sounds normal. No respiratory distress. She has no wheezes. She has  no rales. She exhibits no tenderness.  Abdominal: Soft. Bowel sounds are normal. She exhibits no mass. There is no tenderness.  Musculoskeletal: Normal range of motion.  Lymphadenopathy:    She has no cervical adenopathy.  Neurological: She is alert and oriented to person, place, and time.  Skin: Skin is warm and dry. No rash noted.  Psychiatric: She has a normal mood and  affect. Her behavior is normal.          Assessment & Plan:   Viral URI with cough. We'll continue symptomatic treatment and Tylenol for temperature control. Will call if unimproved Diabetes mellitus more frequent home blood sugar monitor and encouraged

## 2013-04-17 NOTE — Progress Notes (Signed)
Pre-visit discussion using our clinic review tool. No additional management support is needed unless otherwise documented below in the visit note.  

## 2013-04-18 DIAGNOSIS — E1165 Type 2 diabetes mellitus with hyperglycemia: Secondary | ICD-10-CM

## 2013-04-18 DIAGNOSIS — IMO0002 Reserved for concepts with insufficient information to code with codable children: Secondary | ICD-10-CM

## 2013-04-18 DIAGNOSIS — E1169 Type 2 diabetes mellitus with other specified complication: Principal | ICD-10-CM

## 2013-04-18 NOTE — Progress Notes (Signed)
Patient was seen on 04/18/13 for the second of a series of three diabetes self-management courses at the [facility]. The following learning objectives were met by the patient during this class:   Describe the role of different macronutrients on glucose  Explain how carbohydrates affect blood glucose  State what foods contain the most carbohydrates  Demonstrate carbohydrate counting  Demonstrate how to read Nutrition Facts food label  Describe effects of various fats on heart health  Describe the importance of good nutrition for health and healthy eating strategies  Describe techniques for managing your shopping, cooking and meal planning  List strategies to follow meal plan when dining out  Describe the effects of alcohol on glucose and how to use it safely  Goals:  Follow Diabetes Meal Plan as instructed  Eat 3 meals and 2 snacks, every 3-5 hrs  Aim for carbohydrate intake to 30-45 grams carbohydrate/meal Aim for carbohydrate intake to 0-15 grams carbohydrate/snack Add lean protein foods to meals/snacks  Monitor glucose levels as instructed by your doctor   Follow-Up Plan:  Attend Core 3  Work towards following your personal food plan.   

## 2013-04-23 ENCOUNTER — Ambulatory Visit (INDEPENDENT_AMBULATORY_CARE_PROVIDER_SITE_OTHER): Payer: Medicare Other | Admitting: Family

## 2013-04-23 ENCOUNTER — Encounter: Payer: Self-pay | Admitting: Family

## 2013-04-23 VITALS — BP 138/84 | HR 81 | Wt 128.0 lb

## 2013-04-23 DIAGNOSIS — E1165 Type 2 diabetes mellitus with hyperglycemia: Principal | ICD-10-CM

## 2013-04-23 DIAGNOSIS — IMO0001 Reserved for inherently not codable concepts without codable children: Secondary | ICD-10-CM

## 2013-04-23 NOTE — Progress Notes (Signed)
Pre visit review using our clinic review tool, if applicable. No additional management support is needed unless otherwise documented below in the visit note. 

## 2013-04-23 NOTE — Progress Notes (Signed)
   Subjective:    Patient ID: Gabrielle Robinson, female    DOB: [birth year], 73 y.o.   MRN: [id number]  HPI  73 year old white female, nonsmoker, patient of Dr. Arnoldo Morale in a as a recheck of type 2 diabetes after starting Kombiglyze 3 weeks ago. She is tolerating the medication well. Has concerns that her insurance company will not cover the cost of the medication.   Review of Systems  Constitutional: Negative.   Respiratory: Negative.   Cardiovascular: Negative.   Gastrointestinal: Negative.   Endocrine: Negative.   Genitourinary: Negative.   Musculoskeletal: Negative.   Skin: Negative.   Neurological: Negative.   Psychiatric/Behavioral: Negative.    Past Medical History  Diagnosis Date  . Diabetes mellitus without complication     History   Social History  . Marital Status: Married    Spouse Name: N/A    Number of Children: N/A  . Years of Education: N/A   Occupational History  . Not on file.   Social History Main Topics  . Smoking status: Never Smoker   . Smokeless tobacco: Never Used  . Alcohol Use: Yes     Comment: occ  . Drug Use: No  . Sexual Activity: Not on file   Other Topics Concern  . Not on file   Social History Narrative  . No narrative on file    History reviewed. No pertinent past surgical history.  No family history on file.  No Known Allergies  Current Outpatient Prescriptions on File Prior to Visit  Medication Sig Dispense Refill  . acetaminophen (TYLENOL) 325 MG tablet Take 650 mg by mouth every 6 (six) hours as needed.      Marland Kitchen glucose blood (ONETOUCH VERIO) test strip Use as instructed  100 each  12  . neomycin-polymyxin-hydrocortisone (CORTISPORIN) otic solution Place 5 drops into the left ear 4 (four) times daily.  10 mL  0  . phenylephrine (SUDAFED PE) 10 MG TABS tablet Take 10 mg by mouth every 4 (four) hours as needed.      . Saxagliptin-Metformin (KOMBIGLYZE XR) 5-500 MG TB24 Take 1 tablet by mouth daily after breakfast.  30 tablet  11     No current facility-administered medications on file prior to visit.    BP 138/84  Pulse 81  Wt 128 lb (58.06 kg)chart    Objective:   Physical Exam  Constitutional: She is oriented to person, place, and time. She appears well-developed and well-nourished.  HENT:  Right Ear: External ear normal.  Left Ear: External ear normal.  Nose: Nose normal.  Mouth/Throat: Oropharynx is clear and moist.  Neck: Normal range of motion. Neck supple.  Cardiovascular: Normal rate, regular rhythm and normal heart sounds.   Pulmonary/Chest: Effort normal and breath sounds normal.  Abdominal: Soft. Bowel sounds are normal. She exhibits no distension. There is no tenderness. There is no rebound.  Neurological: She is alert and oriented to person, place, and time.  Skin: Skin is warm and dry.  Psychiatric: She has a normal mood and affect.          Assessment & Plan:   assessment: 1. Type 2 diabetes-uncontrolled  Plan: Continue current medication. Patient will call with the preferred drug list for [facility] and we will change the medication accordingly. Return in March for A1c and lipids. Consider adding statin medication if indicated at that time. Encouraged healthy diet, exercise, low carbs. Continue seeing the nutritionist as scheduled.

## 2013-04-23 NOTE — Patient Instructions (Signed)

## 2013-04-25 ENCOUNTER — Telehealth: Payer: Self-pay

## 2013-04-25 ENCOUNTER — Telehealth: Payer: Self-pay | Admitting: Internal Medicine

## 2013-04-25 DIAGNOSIS — E1169 Type 2 diabetes mellitus with other specified complication: Principal | ICD-10-CM

## 2013-04-25 DIAGNOSIS — IMO0002 Reserved for concepts with insufficient information to code with codable children: Secondary | ICD-10-CM

## 2013-04-25 DIAGNOSIS — E1165 Type 2 diabetes mellitus with hyperglycemia: Secondary | ICD-10-CM

## 2013-04-25 NOTE — Telephone Encounter (Signed)
Talked with pt and encouraged her to call [facility] back and demand a list of preferred diabetic meds.  She states when she called yesterday, they did not have a list??

## 2013-04-25 NOTE — Telephone Encounter (Signed)
Relevant patient education mailed to patient.  

## 2013-04-25 NOTE — Telephone Encounter (Signed)
Pt is calling in regards to rx kombiglyze xr, pt states meds is working good for her, however she needs a tier exception explaining pt can not adjust to metformin so she can continue this particular med.

## 2013-04-26 NOTE — Telephone Encounter (Signed)
Appropriate to send letter

## 2013-06-20 ENCOUNTER — Other Ambulatory Visit: Payer: Medicare Other

## 2013-07-29 ENCOUNTER — Ambulatory Visit: Payer: Medicare Other

## 2013-09-13 ENCOUNTER — Ambulatory Visit (INDEPENDENT_AMBULATORY_CARE_PROVIDER_SITE_OTHER): Payer: Medicare Other | Admitting: Internal Medicine

## 2013-09-13 ENCOUNTER — Encounter: Payer: Medicare Other | Admitting: Internal Medicine

## 2013-09-13 ENCOUNTER — Encounter: Payer: Self-pay | Admitting: Internal Medicine

## 2013-09-13 ENCOUNTER — Other Ambulatory Visit: Payer: Self-pay

## 2013-09-13 VITALS — BP 114/74 | HR 81 | Temp 98.0°F | Ht 61.5 in | Wt 120.0 lb

## 2013-09-13 DIAGNOSIS — IMO0002 Reserved for concepts with insufficient information to code with codable children: Secondary | ICD-10-CM

## 2013-09-13 DIAGNOSIS — E785 Hyperlipidemia, unspecified: Secondary | ICD-10-CM

## 2013-09-13 DIAGNOSIS — E1165 Type 2 diabetes mellitus with hyperglycemia: Secondary | ICD-10-CM

## 2013-09-13 DIAGNOSIS — Z0000 Encounter for general adult medical examination without abnormal findings: Secondary | ICD-10-CM

## 2013-09-13 DIAGNOSIS — E1169 Type 2 diabetes mellitus with other specified complication: Secondary | ICD-10-CM

## 2013-09-13 LAB — LIPID PANEL
CHOL/HDL RATIO: 4
CHOLESTEROL: 229 mg/dL — AB (ref 0–200)
HDL: 54.2 mg/dL (ref >39.00)
LDL Cholesterol: 141 mg/dL — ABNORMAL HIGH (ref 0–99)
NONHDL: 174.8
Triglycerides: 169 mg/dL — ABNORMAL HIGH (ref 0.0–149.0)
VLDL: 33.8 mg/dL (ref 0.0–40.0)

## 2013-09-13 LAB — BASIC METABOLIC PANEL
BUN: 15 mg/dL (ref 6–23)
CALCIUM: 9.8 mg/dL (ref 8.4–10.5)
CHLORIDE: 104 meq/L (ref 96–112)
CO2: 29 meq/L (ref 19–32)
CREATININE: 0.8 mg/dL (ref 0.4–1.2)
GFR: 74.54 mL/min (ref >60.00)
Glucose, Bld: 116 mg/dL — ABNORMAL HIGH (ref 70–99)
POTASSIUM: 4 meq/L (ref 3.5–5.1)
Sodium: 139 mEq/L (ref 135–145)

## 2013-09-13 LAB — CBC WITH DIFFERENTIAL/PLATELET
Basophils Absolute: 0.1 10*3/uL (ref 0.0–0.1)
Basophils Relative: 0.9 % (ref 0.0–3.0)
EOS PCT: 4.9 % (ref 0.0–5.0)
Eosinophils Absolute: 0.4 10*3/uL (ref 0.0–0.7)
HCT: 45 % (ref 36.0–46.0)
HEMOGLOBIN: 15.2 g/dL — AB (ref 12.0–15.0)
Lymphocytes Relative: 31.5 % (ref 12.0–46.0)
Lymphs Abs: 2.3 10*3/uL (ref 0.7–4.0)
MCHC: 33.7 g/dL (ref 30.0–36.0)
MCV: 90.2 fl (ref 78.0–100.0)
MONOS PCT: 9.4 % (ref 3.0–12.0)
Monocytes Absolute: 0.7 10*3/uL (ref 0.1–1.0)
Neutro Abs: 3.9 10*3/uL (ref 1.4–7.7)
Neutrophils Relative %: 53.3 % (ref 43.0–77.0)
PLATELETS: 291 10*3/uL (ref 150.0–400.0)
RBC: 4.99 Mil/uL (ref 3.87–5.11)
RDW: 12.8 % (ref 11.5–15.5)
WBC: 7.2 10*3/uL (ref 4.0–10.5)

## 2013-09-13 LAB — MICROALBUMIN / CREATININE URINE RATIO
CREATININE, U: 185 mg/dL
MICROALB UR: 1.3 mg/dL (ref 0.0–1.9)
MICROALB/CREAT RATIO: 0.7 mg/g (ref 0.0–30.0)

## 2013-09-13 LAB — HEMOGLOBIN A1C: HEMOGLOBIN A1C: 6.7 % — AB (ref 4.6–6.5)

## 2013-09-13 LAB — TSH: TSH: 1.68 u[IU]/mL (ref 0.35–4.50)

## 2013-09-13 NOTE — Progress Notes (Signed)
Pre visit review using our clinic review tool, if applicable. No additional management support is needed unless otherwise documented below in the visit note. 

## 2013-09-13 NOTE — Progress Notes (Signed)
Subjective:    Patient ID: Gabrielle Robinson, female    DOB: [birth year], 72 y.o.   MRN: [id number]  HPI [employer] medicare  CPX DM, Allergies CBG's stable and energy is good Weight good exercising    Review of Systems  Constitutional: Negative for activity change, appetite change and fatigue.  HENT: Negative for congestion, ear pain, postnasal drip and sinus pressure.   Eyes: Negative for redness and visual disturbance.  Respiratory: Negative for cough, shortness of breath and wheezing.   Gastrointestinal: Negative for abdominal pain and abdominal distention.  Genitourinary: Negative for dysuria, frequency and menstrual problem.  Musculoskeletal: Negative for arthralgias, joint swelling, myalgias and neck pain.  Skin: Negative for rash and wound.  Neurological: Negative for dizziness, weakness and headaches.  Hematological: Negative for adenopathy. Does not bruise/bleed easily.  Psychiatric/Behavioral: Negative for sleep disturbance and decreased concentration.   Past Medical History  Diagnosis Date  . Diabetes mellitus without complication     History   Social History  . Marital Status: Married    Spouse Name: N/A    Number of Children: N/A  . Years of Education: N/A   Occupational History  . Not on file.   Social History Main Topics  . Smoking status: Never Smoker   . Smokeless tobacco: Never Used  . Alcohol Use: Yes     Comment: occ  . Drug Use: No  . Sexual Activity: Not on file   Other Topics Concern  . Not on file   Social History Narrative  . No narrative on file    No past surgical history on file.  No family history on file.  No Known Allergies  Current Outpatient Prescriptions on File Prior to Visit  Medication Sig Dispense Refill  . acetaminophen (TYLENOL) 325 MG tablet Take 650 mg by mouth every 6 (six) hours as needed.      Marland Kitchen glucose blood (ONETOUCH VERIO) test strip Use as instructed  100 each  12  . neomycin-polymyxin-hydrocortisone  (CORTISPORIN) otic solution Place 5 drops into the left ear 4 (four) times daily.  10 mL  0  . phenylephrine (SUDAFED PE) 10 MG TABS tablet Take 10 mg by mouth every 4 (four) hours as needed.      . Saxagliptin-Metformin (KOMBIGLYZE XR) 5-500 MG TB24 Take 1 tablet by mouth daily after breakfast.  30 tablet  11   No current facility-administered medications on file prior to visit.    BP 114/74  Pulse 81  Temp(Src) 98 F (36.7 C) (Oral)  Ht 5' 1.5" (1.562 m)  Wt 120 lb (54.432 kg)  BMI 22.31 kg/m2  SpO2 98%        Objective:   Physical Exam  Constitutional: She is oriented to person, place, and time. She appears well-developed and well-nourished. No distress.  HENT:  Head: Normocephalic and atraumatic.  Right Ear: External ear normal.  Left Ear: External ear normal.  Nose: Nose normal.  Mouth/Throat: Oropharynx is clear and moist.  Eyes: Conjunctivae and EOM are normal. Pupils are equal, round, and reactive to light.  Neck: Normal range of motion. Neck supple. No JVD present. No tracheal deviation present. No thyromegaly present.  Cardiovascular: Normal rate, regular rhythm, normal heart sounds and intact distal pulses.   No murmur heard. Pulmonary/Chest: Effort normal and breath sounds normal. She has no wheezes. She exhibits no tenderness.  Abdominal: Soft. Bowel sounds are normal.  Musculoskeletal: Normal range of motion. She exhibits no edema and no tenderness.  Lymphadenopathy:  She has no cervical adenopathy.  Neurological: She is alert and oriented to person, place, and time. She has normal reflexes. No cranial nerve deficit.  Skin: Skin is warm and dry. She is not diaphoretic.  Psychiatric: She has a normal mood and affect. Her behavior is normal.          Assessment & Plan:   This is a routine wellness  examination for this patient . I reviewed all health maintenance protocols including mammography, colonoscopy, bone density Needed referrals were placed. Age  and diagnosis  appropriate screening labs were ordered. Her immunization history was reviewed and appropriate vaccinations were ordered. Her current medications and allergies were reviewed and needed refills of her chronic medications were ordered. The plan for yearly health maintenance was discussed all orders and referrals were made as appropriate.  A1c and microalbumin ordered Screening labs  Eye exam scheduled

## 2013-09-13 NOTE — Patient Instructions (Signed)
The patient is instructed to continue all medications as prescribed. Schedule followup with check out clerk upon leaving the clinic  

## 2013-10-10 NOTE — Progress Notes (Signed)
Patient was seen on 04/25/2013 for the third of a series of three diabetes self-management courses at the [facility]. The following learning objectives were met by the patient during this class:    State the amount of activity recommended for healthy living   Describe activities suitable for individual needs   Identify ways to regularly incorporate activity into daily life   Identify barriers to activity and ways to over come these barriers  Identify diabetes medications being personally used and their primary action for lowering glucose and possible side effects   Describe role of stress on blood glucose and develop strategies to address psychosocial issues   Identify diabetes complications and ways to prevent them  Explain how to manage diabetes during illness   Evaluate success in meeting personal goal   Establish 2-3 goals that they will plan to diligently work on until they return for the  4-month follow-up visit  Goals:  Follow Diabetes Meal Plan as instructed  Aim for 15-30 mins of physical activity daily as tolerated  Bring food record and glucose log to your follow up visit  Your patient has identified their diabetes self-care support plan as  [facility] Support Group  Plan:  Attend Core 4 in 4 months   

## 2014-01-02 ENCOUNTER — Telehealth: Payer: Self-pay

## 2014-01-02 NOTE — Telephone Encounter (Signed)
Left message with husband for call back.   Called patient regarding diabetic eye exam.  When patient calls back please ask:  Have you had a recent (2014-2015) eye exam?    Date of Exam?  Where?

## 2014-02-24 NOTE — Telephone Encounter (Signed)
No call back from patient.  Encounter closed.   

## 2014-03-03 ENCOUNTER — Other Ambulatory Visit (INDEPENDENT_AMBULATORY_CARE_PROVIDER_SITE_OTHER): Payer: Medicare Other

## 2014-03-03 DIAGNOSIS — IMO0002 Reserved for concepts with insufficient information to code with codable children: Secondary | ICD-10-CM

## 2014-03-03 DIAGNOSIS — E118 Type 2 diabetes mellitus with unspecified complications: Secondary | ICD-10-CM

## 2014-03-03 DIAGNOSIS — E1165 Type 2 diabetes mellitus with hyperglycemia: Secondary | ICD-10-CM

## 2014-03-03 LAB — HEMOGLOBIN A1C: Hgb A1c MFr Bld: 6.6 % — ABNORMAL HIGH (ref 4.6–6.5)

## 2014-03-10 ENCOUNTER — Ambulatory Visit (INDEPENDENT_AMBULATORY_CARE_PROVIDER_SITE_OTHER): Payer: Medicare Other | Admitting: Family Medicine

## 2014-03-10 ENCOUNTER — Ambulatory Visit: Payer: Medicare Other | Admitting: Family Medicine

## 2014-03-10 ENCOUNTER — Encounter: Payer: Self-pay | Admitting: Family Medicine

## 2014-03-10 VITALS — BP 136/80 | HR 79 | Temp 98.3°F | Ht 61.5 in | Wt 119.1 lb

## 2014-03-10 DIAGNOSIS — E785 Hyperlipidemia, unspecified: Secondary | ICD-10-CM

## 2014-03-10 DIAGNOSIS — Z7689 Persons encountering health services in other specified circumstances: Secondary | ICD-10-CM

## 2014-03-10 DIAGNOSIS — Z7189 Other specified counseling: Secondary | ICD-10-CM

## 2014-03-10 DIAGNOSIS — E118 Type 2 diabetes mellitus with unspecified complications: Secondary | ICD-10-CM

## 2014-03-10 DIAGNOSIS — Z23 Encounter for immunization: Secondary | ICD-10-CM

## 2014-03-10 DIAGNOSIS — F32A Depression, unspecified: Secondary | ICD-10-CM

## 2014-03-10 DIAGNOSIS — F329 Major depressive disorder, single episode, unspecified: Secondary | ICD-10-CM

## 2014-03-10 MED ORDER — ESCITALOPRAM OXALATE 5 MG PO TABS: 5.0000 mg | ORAL_TABLET | Freq: Every day | ORAL | Status: DC

## 2014-03-10 MED ORDER — METFORMIN HCL 500 MG PO TABS: 500.0000 mg | ORAL_TABLET | Freq: Two times a day (BID) | ORAL | Status: DC

## 2014-03-10 NOTE — Progress Notes (Signed)
HPI:  Gabrielle Robinson is a pleasant 73 yo F prior patient of Dr. Arnoldo Morale here for follow up. I have not seen her before.   Chronic medical conditions:  DM: -denies hx neuropathy, renal disease, retinopathy -meds: metformin 500mg  bid -diet and exercise: she has worked really hard on her diet and is eating small healthy regular meals and limiting the sweets and carbs; walks about 1x per week for about 20 minutes -last eye exam: almost 1 year - has opthomologist -denies: foot lesions,  Low blood sugars Lab Results  Component Value Date   HGBA1C 6.6* 03/03/2014   Depression: -irritable mood, does not want addictive medication - has been going on for some time and wants to try medication. Depression Symptoms: Sleep disorder: yes occ, uses OTC sleep aid Interest deficit/anhedonia:  occ Guilt (worthlessness, hopelessness, regret):no Energy deficit: no Concentration deficit:no Appetite disorder: no Psychomotor retardation or agitation: yes Suicidality: no    ROS: See pertinent positives and negatives per HPI.  Past Medical History  Diagnosis Date  . Diabetes mellitus without complication     History reviewed. No pertinent past surgical history.  History reviewed. No pertinent family history.  History   Social History  . Marital Status: Married    Spouse Name: N/A    Number of Children: N/A  . Years of Education: N/A   Social History Main Topics  . Smoking status: Never Smoker   . Smokeless tobacco: Never Used  . Alcohol Use: Yes     Comment: occ  . Drug Use: No  . Sexual Activity: None   Other Topics Concern  . 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  GENERAL: vitals reviewed and listed above, alert, oriented, appears well hydrated and in no acute distress  HEENT: atraumatic, conjunttiva clear, no obvious abnormalities on inspection of external nose and ears  NECK: no obvious masses on inspection  LUNGS: clear to auscultation bilaterally, no wheezes, rales or rhonchi, good air movement  CV: HRRR, no peripheral edema  MS: moves all extremities without noticeable abnormality  PSYCH: pleasant and cooperative, no obvious depression or anxiety  ASSESSMENT AND PLAN:  Discussed the following assessment and plan:  Type 2 diabetes mellitus with complication  Hyperlipemia  Depression - Plan: escitalopram (LEXAPRO) 5 MG tablet -she really wants to start medication nut wants to do very low dose as she is afraid of side effects and wants to start slowly. After discussion risks and benefits she opted for lexapro 5mg  - likely will need to increase, CBT and close follow up.   -Patient advised to return or notify a doctor immediately if symptoms worsen or persist or new concerns arise.  Patient Instructions  BEFORE YOU LEAVE: -schedule follow up 4  weeks -flu shot  Schedule mammogram  Start the lexapro daily and schedule counseling and follow up in 4 weeks  We recommend the following healthy lifestyle measures: - eat a healthy diet consisting of lots of vegetables, fruits, beans, nuts, seeds, healthy meats such as white chicken and fish and whole grains.  - avoid fried foods, fast food, processed foods, sodas, red meet and other fattening foods.  - get a least 150 minutes of aerobic exercise per week.       Colin Benton  R.

## 2014-03-10 NOTE — Progress Notes (Signed)
Pre visit review using our clinic review tool, if applicable. No additional management support is needed unless otherwise documented below in the visit note. 

## 2014-03-10 NOTE — Addendum Note (Signed)
Addended by: Agnes Lawrence on: 03/10/2014 02:34 PM   Modules accepted: Orders

## 2014-03-10 NOTE — Patient Instructions (Addendum)
BEFORE YOU LEAVE: -schedule follow up 4 weeks -flu shot  Schedule mammogram  Start the lexapro daily and schedule counseling and follow up in 4 weeks  We recommend the following healthy lifestyle measures: - eat a healthy diet consisting of lots of vegetables, fruits, beans, nuts, seeds, healthy meats such as white chicken and fish and whole grains.  - avoid fried foods, fast food, processed foods, sodas, red meet and other fattening foods.  - get a least 150 minutes of aerobic exercise per week.

## 2014-04-10 ENCOUNTER — Ambulatory Visit (INDEPENDENT_AMBULATORY_CARE_PROVIDER_SITE_OTHER): Payer: Medicare Other | Admitting: Family Medicine

## 2014-04-10 ENCOUNTER — Encounter: Payer: Self-pay | Admitting: Family Medicine

## 2014-04-10 VITALS — BP 110/78 | HR 52 | Temp 98.1°F | Ht 60.5 in | Wt 119.1 lb

## 2014-04-10 DIAGNOSIS — E119 Type 2 diabetes mellitus without complications: Secondary | ICD-10-CM

## 2014-04-10 DIAGNOSIS — F329 Major depressive disorder, single episode, unspecified: Secondary | ICD-10-CM

## 2014-04-10 DIAGNOSIS — F32A Depression, unspecified: Secondary | ICD-10-CM

## 2014-04-10 DIAGNOSIS — E785 Hyperlipidemia, unspecified: Secondary | ICD-10-CM

## 2014-04-10 MED ORDER — ESCITALOPRAM OXALATE 5 MG PO TABS: 5.0000 mg | ORAL_TABLET | Freq: Every day | ORAL | Status: DC

## 2014-04-10 NOTE — Progress Notes (Signed)
HPI:  Follow up: Prior patient of Dr. Adline Mango whom showed up for follow up appt with depression in 02/2014.   Depression: -chronic, initially presented to me 02/2014 -fearful of medications but opted for low dose lexapro and advised CBT - she reports she is doing really well on the low dose lexpro with symptoms resolved Depression Symptoms: Sleep disorder: yes occ, uses OTC sleep aid Interest deficit/anhedonia: no Guilt (worthlessness, hopelessness, regret):no Energy deficit: no Concentration deficit:no Appetite disorder: no Psychomotor retardation or agitation: no Suicidality: no  DM: -well controlled on last check -walking and trying to watch diet -meds: metformin -has not yet scheduled eye exam -denies:vision changes, polyuria, polydipsia  HLD: -on review of labs from prior PCP -she reports she was not aware of this -advised statin, she prefers to work on diet and exercise and recheck in 3 months  HM: -mammo, optho exam, shingles, dexa offered - had health maintenance exam with prior PCP 08/2013   ROS: See pertinent positives and negatives per HPI.  Past Medical History  Diagnosis Date  . Diabetes mellitus without complication     No past surgical history on file.  No family history on file.  History   Social History  . Marital Status: Married    Spouse Name: N/A    Number of Children: N/A  . Years of Education: N/A   Social History Main Topics  . Smoking status: Never Smoker   . Smokeless tobacco: Never Used  . Alcohol Use: Yes     Comment: occ  . Drug Use: No  . Sexual Activity: None   Other Topics Concern  . 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  escitalopram (LEXAPRO) 5 MG tablet,  Take 1 tablet (5 mg total) by mouth daily., Disp: 90 tablet, Rfl: 1 .  metFORMIN (GLUCOPHAGE) 500 MG tablet, Take 1 tablet (500 mg total) by mouth 2 (two) times daily with a meal., Disp: 90 tablet, Rfl: 3  EXAM:  Filed Vitals:   04/10/14 1257  BP: 110/78  Pulse: 52  Temp: 98.1 F (36.7 C)    Body mass index is 22.87 kg/(m^2).  GENERAL: vitals reviewed and listed above, alert, oriented, appears well hydrated and in no acute distress  HEENT: atraumatic, conjunttiva clear, no obvious abnormalities on inspection of external nose and ears  NECK: no obvious masses on inspection  LUNGS: clear to auscultation bilaterally, no wheezes, rales or rhonchi, good air movement  CV: HRRR, no peripheral edema  MS: moves all extremities without noticeable abnormality  PSYCH: pleasant and cooperative, no obvious depression or anxiety  ASSESSMENT AND PLAN:  Discussed the following assessment and plan:  Depression - Plan: escitalopram (LEXAPRO) 5 MG tablet -continue very low dose lexapro - she understands this is lower dose then normal but reports has worked well and does not want to stop  Type 2 diabetes mellitus without complication -stable, diet and lifestyle recs -advised to schedule diabetic eye exam  Hyperlipemia -advised recheck and statin if above goal w/ healthy diet and exercise -she prefers to increase exercise and work on improved diet and recheck in 3 months -risks/benefits statin discussed  -Patient advised to return  or notify a doctor immediately if symptoms worsen or persist or new concerns arise.  Patient Instructions  BEFORE YOU LEAVE: -follow up in 3 months, morning appointment - come fasting  Schedule your mammogram  Schedule your eye exam - diabetic eye exam  Check to see if your insurance covers the shingles vaccine  We recommend the following healthy lifestyle measures: - eat a healthy diet consisting of lots of vegetables, fruits, beans, nuts, seeds,  healthy meats such as white chicken and fish and whole grains.  - avoid fried foods, fast food, processed foods, sodas, red meet and other fattening foods.  - get a least 150 minutes of aerobic exercise per week.   We are shooting for a goal of < 100 for the LDL cholesterol! If not able to get there with the diet and exercise would advise starting a medication.      Colin Benton R.

## 2014-04-10 NOTE — Progress Notes (Signed)
Pre visit review using our clinic review tool, if applicable. No additional management support is needed unless otherwise documented below in the visit note. 

## 2014-04-10 NOTE — Patient Instructions (Signed)
BEFORE YOU LEAVE: -follow up in 3 months, morning appointment - come fasting  Schedule your mammogram  Schedule your eye exam - diabetic eye exam  Check to see if your insurance covers the shingles vaccine  We recommend the following healthy lifestyle measures: - eat a healthy diet consisting of lots of vegetables, fruits, beans, nuts, seeds, healthy meats such as white chicken and fish and whole grains.  - avoid fried foods, fast food, processed foods, sodas, red meet and other fattening foods.  - get a least 150 minutes of aerobic exercise per week.   We are shooting for a goal of < 100 for the LDL cholesterol! If not able to get there with the diet and exercise would advise starting a medication.

## 2014-06-16 ENCOUNTER — Other Ambulatory Visit: Payer: Self-pay

## 2014-06-16 ENCOUNTER — Ambulatory Visit
Admission: RE | Admit: 2014-06-16 | Discharge: 2014-06-16 | Disposition: A | Discharge status: Home / Self Care | Payer: Medicare Other | Source: Ambulatory Visit

## 2014-06-16 DIAGNOSIS — Z1231 Encounter for screening mammogram for malignant neoplasm of breast: Secondary | ICD-10-CM

## 2014-06-16 LAB — HM MAMMOGRAPHY

## 2014-06-23 ENCOUNTER — Other Ambulatory Visit: Payer: Self-pay | Admitting: Family Medicine

## 2014-06-23 NOTE — Telephone Encounter (Signed)
Pt request refill of the following: metFORMIN (GLUCOPHAGE) 500 MG tablet   Pt said she gets 3 month supply which is 180 pills    Phamacy:  [facility]

## 2014-06-23 NOTE — Telephone Encounter (Signed)
Rx done. 

## 2014-07-11 ENCOUNTER — Ambulatory Visit (INDEPENDENT_AMBULATORY_CARE_PROVIDER_SITE_OTHER): Payer: Medicare Other | Admitting: Family Medicine

## 2014-07-11 ENCOUNTER — Encounter: Payer: Self-pay | Admitting: Family Medicine

## 2014-07-11 VITALS — BP 130/70 | HR 72 | Temp 98.0°F | Ht 60.5 in | Wt 122.6 lb

## 2014-07-11 DIAGNOSIS — E785 Hyperlipidemia, unspecified: Secondary | ICD-10-CM | POA: Diagnosis not present

## 2014-07-11 DIAGNOSIS — F39 Unspecified mood [affective] disorder: Secondary | ICD-10-CM | POA: Insufficient documentation

## 2014-07-11 DIAGNOSIS — E119 Type 2 diabetes mellitus without complications: Secondary | ICD-10-CM | POA: Diagnosis not present

## 2014-07-11 NOTE — Patient Instructions (Addendum)
BEFORE  YOU LEAVE: -schedule a fasting lab appointment in next 2 weeks  We recommend the following healthy lifestyle measures: - eat a healthy diet consisting of lots of vegetables, fruits, beans, nuts, seeds, healthy meats such as white chicken and fish and whole grains.  - avoid fried foods, fast food, processed foods, sodas, red meet and other fattening foods.  - get a least 150 minutes of aerobic exercise per week.

## 2014-07-11 NOTE — Progress Notes (Signed)
HPI:  Depression: -chronic, initially presented to me 02/2014 -fearful of medications but opted for low dose lexapro and advised CBT - she reports she is doing really well on the very low dose lexpro with symptoms resolved Depression Symptoms: Sleep disorder: yes occ, uses OTC sleep aid Interest deficit/anhedonia: no Guilt (worthlessness, hopelessness, regret):no Energy deficit: no Concentration deficit:no Appetite disorder: no Psychomotor retardation or agitation: no Suicidality: no  DM: -well controlled on last check -no regular exercise; reports diet is ok - tries to eat vegetables, tries to watch portion sizes -meds: metformin 500mg  bid -eye exam: has eye exam scheduled for next month -denies:vision changes, polyuria, polydipsia, hypoglycemia  HLD: -on review of labs from prior PCP -she reported she was not aware of this and declined statin -reports: she prefers not to do a statin -denies: hx statin intol - never took a cholesterol medication  HM: -optho exam - scheduled -shingles- declined, advised of benefits/risks and advised to let us know if changes her mind -pneumonia - declined, advised of benefits/risks and advised to let us know if changes her mind -dexa offered - had health maintenance exam with prior PCP 08/2013; reports she never wanted and declined today as well - advised her of benefits and advised to let us know if she changes her mind   ROS: See pertinent positives and negatives per HPI.  Past Medical History  Diagnosis Date  . Diabetes mellitus without complication     No past surgical history on file.  No family history on file.  History   Social History  . Marital Status: Married    Spouse Name: N/A  . Number of Children: N/A  . Years of Education: N/A   Social History Main Topics  . Smoking status: Never Smoker   . Smokeless tobacco: Never Used  . Alcohol Use: Yes     Comment: occ  . Drug Use: No  . Sexual Activity: Not on file    Other Topics Concern  . 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  B Complex-C (SUPER B COMPLEX PO), Take by mouth., Disp: , Rfl:  .  CRANBERRY PO, Take by mouth., Disp: , Rfl:  .  Cyanocobalamin (VITAMIN B-12 CR PO), Take by mouth., Disp: , Rfl:  .  escitalopram (LEXAPRO) 5 MG tablet, Take 1 tablet (5 mg total) by mouth daily., Disp: 90 tablet, Rfl: 1 .  metFORMIN (GLUCOPHAGE) 500 MG tablet, TAKE 1 TABLET (500 MG TOTAL) BY MOUTH 2 (TWO) TIMES DAILY WITH A MEAL., Disp: 180 tablet, Rfl: 0  EXAM:  Filed Vitals:   07/11/14 1010  BP: 130/70  Pulse: 72  Temp: 98 F (36.7 C)    Body mass index is 23.54 kg/(m^2).  GENERAL: vitals reviewed and listed above, alert, oriented, appears well hydrated and in no acute distress  HEENT: atraumatic, conjunttiva clear, no obvious abnormalities on inspection of external nose and ears  NECK: no obvious masses on inspection  LUNGS: clear to auscultation bilaterally, no wheezes, rales or rhonchi, good air movement  CV: HRRR, no peripheral edema  MS: moves all extremities without noticeable abnormality  PSYCH: pleasant and cooperative, no obvious depression or  anxiety  ASSESSMENT AND PLAN:  Discussed the following assessment and plan:  Hyperlipemia - Plan: Lipid Panel -she is very reluctant to start statin but agreed to low dose pravastatin if LDL > 100 after discussion risks benefits - along with lifestyle changes -not fasting today so she will set up fasting lab appt  Type 2 diabetes mellitus without complication - Plan: Basic metabolic panel, Microalbumin / creatinine urine ratio, Hemoglobin A1c -lifestyle recs, labs -she is set up for eye exam per her report  Depression -stable  on treatment -continue treatment  -Patient advised to return or notify a doctor immediately if symptoms worsen or persist or new concerns arise.  Patient Instructions  BEFORE  YOU LEAVE: -schedule a fasting lab appointment in next 2 weeks  We recommend the following healthy lifestyle measures: - eat a healthy diet consisting of lots of vegetables, fruits, beans, nuts, seeds, healthy meats such as white chicken and fish and whole grains.  - avoid fried foods, fast food, processed foods, sodas, red meet and other fattening foods.  - get a least 150 minutes of aerobic exercise per week.       Colin Benton R.

## 2014-07-11 NOTE — Progress Notes (Signed)
Pre visit review using our clinic review tool, if applicable. No additional management support is needed unless otherwise documented below in the visit note. 

## 2014-07-18 ENCOUNTER — Other Ambulatory Visit (INDEPENDENT_AMBULATORY_CARE_PROVIDER_SITE_OTHER): Payer: Medicare Other

## 2014-07-18 DIAGNOSIS — E119 Type 2 diabetes mellitus without complications: Secondary | ICD-10-CM

## 2014-07-18 DIAGNOSIS — E785 Hyperlipidemia, unspecified: Secondary | ICD-10-CM | POA: Diagnosis not present

## 2014-07-18 LAB — LIPID PANEL
CHOLESTEROL: 240 mg/dL — AB (ref 0–200)
HDL: 61.3 mg/dL (ref >39.00)
LDL Cholesterol: 144 mg/dL — ABNORMAL HIGH (ref 0–99)
NonHDL: 178.7
TRIGLYCERIDES: 174 mg/dL — AB (ref 0.0–149.0)
Total CHOL/HDL Ratio: 4
VLDL: 34.8 mg/dL (ref 0.0–40.0)

## 2014-07-18 LAB — MICROALBUMIN / CREATININE URINE RATIO
CREATININE, U: 212.2 mg/dL
MICROALB/CREAT RATIO: 0.6 mg/g (ref 0.0–30.0)
Microalb, Ur: 1.3 mg/dL (ref 0.0–1.9)

## 2014-07-18 LAB — BASIC METABOLIC PANEL
BUN: 16 mg/dL (ref 6–23)
CALCIUM: 10.1 mg/dL (ref 8.4–10.5)
CO2: 32 mEq/L (ref 19–32)
CREATININE: 0.83 mg/dL (ref 0.40–1.20)
Chloride: 101 mEq/L (ref 96–112)
GFR: 71.28 mL/min (ref >60.00)
Glucose, Bld: 119 mg/dL — ABNORMAL HIGH (ref 70–99)
Potassium: 4.3 mEq/L (ref 3.5–5.1)
Sodium: 138 mEq/L (ref 135–145)

## 2014-07-18 LAB — HEMOGLOBIN A1C: HEMOGLOBIN A1C: 6.2 % (ref 4.6–6.5)

## 2014-07-21 MED ORDER — PRAVASTATIN SODIUM 20 MG PO TABS: 20.0000 mg | ORAL_TABLET | Freq: Every day | ORAL | Status: DC

## 2014-07-25 LAB — HM DIABETES EYE EXAM

## 2014-07-29 ENCOUNTER — Encounter: Payer: Self-pay | Admitting: Family Medicine

## 2014-09-15 ENCOUNTER — Encounter: Payer: Medicare Other | Admitting: Family Medicine

## 2014-09-17 ENCOUNTER — Encounter: Payer: Self-pay | Admitting: Family Medicine

## 2014-09-17 ENCOUNTER — Ambulatory Visit (INDEPENDENT_AMBULATORY_CARE_PROVIDER_SITE_OTHER): Payer: Medicare Other | Admitting: Family Medicine

## 2014-09-17 VITALS — BP 128/72 | HR 70 | Temp 97.7°F | Ht 60.5 in | Wt 123.1 lb

## 2014-09-17 DIAGNOSIS — E119 Type 2 diabetes mellitus without complications: Secondary | ICD-10-CM

## 2014-09-17 DIAGNOSIS — F39 Unspecified mood [affective] disorder: Secondary | ICD-10-CM | POA: Diagnosis not present

## 2014-09-17 DIAGNOSIS — Z23 Encounter for immunization: Secondary | ICD-10-CM

## 2014-09-17 DIAGNOSIS — E2839 Other primary ovarian failure: Secondary | ICD-10-CM

## 2014-09-17 DIAGNOSIS — M899 Disorder of bone, unspecified: Secondary | ICD-10-CM | POA: Diagnosis not present

## 2014-09-17 DIAGNOSIS — E785 Hyperlipidemia, unspecified: Secondary | ICD-10-CM | POA: Diagnosis not present

## 2014-09-17 DIAGNOSIS — M949 Disorder of cartilage, unspecified: Secondary | ICD-10-CM

## 2014-09-17 DIAGNOSIS — F329 Major depressive disorder, single episode, unspecified: Secondary | ICD-10-CM

## 2014-09-17 DIAGNOSIS — F32A Depression, unspecified: Secondary | ICD-10-CM

## 2014-09-17 DIAGNOSIS — Z0000 Encounter for general adult medical examination without abnormal findings: Secondary | ICD-10-CM

## 2014-09-17 MED ORDER — PRAVASTATIN SODIUM 20 MG PO TABS: 20.0000 mg | ORAL_TABLET | Freq: Every day | ORAL | Status: DC

## 2014-09-17 MED ORDER — METFORMIN HCL 500 MG PO TABS: ORAL_TABLET | ORAL | Status: DC

## 2014-09-17 MED ORDER — ESCITALOPRAM OXALATE 5 MG PO TABS
5.0000 mg | ORAL_TABLET | Freq: Every day | ORAL | Status: DC
Start: 2014-09-17 — End: 2015-01-08

## 2014-09-17 NOTE — Addendum Note (Signed)
Addended by: Agnes Lawrence on: 09/17/2014 10:15 AM   Modules accepted: Orders

## 2014-09-17 NOTE — Patient Instructions (Addendum)
BEFORE YOU LEAVE: -prevnar 13 -follow up in October   Please see a lawyer and/or go to this website to help you with advanced directives and designating a health care power of attorney so that your wishes will be followed should you become too ill to make your own medical decisions.  Proor.no  Other providers: -Dr. Lavonia Drafts - opthomology   Check on the cost of the shingles vaccine and let use know if you want to do this. We will plan to do your flu vaccine in October.  he following written screening schedule of preventive measures were reviewed with assessment and plan made per below, orders and patient instructions:          Alcohol screening: done     Obesity Screening and counseling: done     STI screening (Hep C if born [birth year]): declined     Tobacco Screening:done       Pneumococcal (PPSV23 -one dose after 64, one before if risk factors), influenza yearly and hepatitis B vaccines (if high risk - end stage renal disease, IV drugs, homosexual men, live in home for mentally retarded, hemophilia receiving factors) ASSESSMENT/PLAN: prenar 58 today      Screening mammograph (yearly if >40) ASSESSMENT/PLAN:  done      Screening Pap smear/pelvic exam (q2 years) ASSESSMENT/PLAN: n/a      Colorectal cancer screening (FOBT yearly or flex sig q4y or colonoscopy q10y or barium enema q4y) ASSESSMENT/PLAN: done      Diabetes outpatient self-management training services ASSESSMENT/PLAN: done      Bone mass measurements(covered q2y if indicated - estrogen def, osteoporosis, hyperparathyroid, vertebral abnormalities, osteoporosis or steroids) ASSESSMENT/PLAN: ordered      Screening for glaucoma(q1y if high risk - diabetes, FH, AA and > 50 or hispanic and > 65) ASSESSMENT/PLAN: sees opthomologist      Medical nutritional therapy for individuals with diabetes or renal disease ASSESSMENT/PLAN: done      Cardiovascular screening blood tests (lipids  q5y) ASSESSMENT/PLAN: done      Diabetes screening tests ASSESSMENT/PLAN: done

## 2014-09-17 NOTE — Progress Notes (Signed)
Pre visit review using our clinic review tool, if applicable. No additional management support is needed unless otherwise documented below in the visit note. 

## 2014-09-17 NOTE — Progress Notes (Signed)
Medicare Annual Preventive Care Visit  (initial annual wellness or annual wellness exam)  Concerns and/or follow up today:  Depression: -chronic, initially presented to me 02/2014 -fearful of medications but opted for low dose lexapro and advised CBT - she reports she is doing really well on the very low dose lexpro with symptoms resolved -denies: SI, depression  DM: -well controlled on last check -no regular exercise; reports diet is ok - tries to eat vegetables, tries to watch portion sizes -meds: metformin 500mg  bid -eye exam: has eye exam scheduled for next month -denies:vision changes, polyuria, polydipsia, hypoglycemia  HLD: -started pravastatin 20mg  06/2014 -denies: hx statin intol, leg cramps, cog changes  HM: -shingles- declined, advised of benefits/risks and advised to let us know if changes her mind -pneumonia - agreed to prevnar 13 today -dexa offered - she agreed to this, reports no hx of fx  ROS: negative for report of fevers, unintentional weight loss, vision changes, vision loss, hearing loss or change, chest pain, sob, hemoptysis, melena, hematochezia, hematuria, genital discharge or lesions, falls, bleeding or bruising, loc, thoughts of suicide or self harm, memory loss  1.) Patient-completed health risk assessment  - completed and reviewed, see scanned documentation  2.) Review of Medical History: -PMH, PSH, Family History and current specialty and care providers reviewed and updated  - see scanned in document in chart and below  Past Medical History  Diagnosis Date  . Diabetes mellitus without complication     No past surgical history on file.  History   Social History  . Marital Status: Married    Spouse Name: N/A  . Number of Children: N/A  . Years of Education: N/A   Occupational History  . Not on file.   Social History Main Topics  . Smoking status: Never Smoker   . Smokeless tobacco: Never Used  . Alcohol Use: Yes     Comment: occ  .  Drug Use: No  . Sexual Activity: Not on file   Other Topics Concern  . 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 YES - not seeing anyone for this  History of falling? YES but was tubing, no injury  Any trouble with IADLs - using a phone, using transportation, grocery shopping, preparing meals, doing housework, doing laundry, taking medications and managing money?  NO  Advance Directives? NO discussed  See summary of recommendations in Patient Instructions below.  4.) Physical Exam Filed Vitals:   09/17/14 0910  BP: 128/72  Pulse: 70  Temp: 97.7 F (36.5 C)   Estimated body mass index is 23.64 kg/(m^2) as calculated from the following:   Height as of this encounter: 5' 0.5" (1.537 m).   Weight as of this encounter: 123 lb 1.6 oz (55.838 kg).  EKG (optional): deferred  General: alert, appear well hydrated and in no acute distress  HEENT: visual acuity grossly intact  CV: HRRR, no peripheral edema  Lungs: CTA bilaterally  Psych: pleasant and cooperative, no obvious depression or anxiety  Cog function grossly intact  See patient instructions for recommendations.  Education and counseling regarding the above review of health provided with a plan for the following: -see scanned patient completed form for further details -fall prevention strategies discussed  -healthy lifestyle discussed -importance and resources for completing advanced directives discussed -see patient instructions below for any other recommendations provided  4)The following written screening schedule of preventive measures were reviewed with assessment and  plan made per below, orders and patient instructions:          Alcohol screening: done     Obesity Screening and counseling: done     STI screening (Hep C if born 1946-65): declined     Tobacco Screening:done       Pneumococcal (PPSV23 -one dose after 64, one before if risk factors), influenza yearly and hepatitis B vaccines (if high risk - end stage renal disease, IV drugs, homosexual men, live in home for mentally retarded, hemophilia receiving factors) ASSESSMENT/PLAN: prenar 76 today      Screening mammograph (yearly if >40) ASSESSMENT/PLAN:  done      Screening Pap smear/pelvic exam (q2 years) ASSESSMENT/PLAN: n/a      Colorectal cancer screening (FOBT yearly or flex sig q4y or colonoscopy q10y or barium enema q4y) ASSESSMENT/PLAN: done      Diabetes outpatient self-management training services ASSESSMENT/PLAN: done      Bone mass measurements(covered q2y if indicated - estrogen def, osteoporosis, hyperparathyroid, vertebral abnormalities, osteoporosis or steroids) ASSESSMENT/PLAN: ordered      Screening for glaucoma(q1y if high risk - diabetes, FH, AA and > 50 or hispanic and > 65) ASSESSMENT/PLAN: sees opthomologist      Medical nutritional therapy for individuals with diabetes or renal disease ASSESSMENT/PLAN: done      Cardiovascular screening blood tests (lipids q5y) ASSESSMENT/PLAN: done      Diabetes screening tests ASSESSMENT/PLAN: done   7.) Summary:  Encounter for Medicare annual wellness exam -risk factors and conditions per above assessment were discussed and treatment, recommendations and referrals were offered per documentation above and orders and patient instructions.See scanned documentation in media.  Disorder of bone and cartilage - Plan: DG Bone Density -discussed risks benefits screening and she agreed to do  Type 2 diabetes mellitus without complication -lifestyle recs, continued exercise, yearly eye exams advised  Depression -stable,  continue current medication  Hyperlipemia -continue low dose statin, check labs in October  Estrogen deficiency - Plan: DG Bone Density  Depression - Plan: escitalopram (LEXAPRO) 5 MG tablet  Patient Instructions  BEFORE YOU LEAVE: -prevnar 53 -follow up in October   Please see a lawyer and/or go to this website to help you with advanced directives and designating a health care power of attorney so that your wishes will be followed should you become too ill to make your own medical decisions.  Proor.no  Other providers: -Dr. Lavonia Drafts - opthomology   Check on the cost of the shingles vaccine and let use know if you want to do this. We will plan to do your flu vaccine in October.  he following written screening schedule of preventive measures were reviewed with assessment and plan made per below, orders and patient instructions:          Alcohol screening: done     Obesity Screening and counseling: done     STI screening (Hep C if born 1946-65): declined     Tobacco Screening:done       Pneumococcal (PPSV23 -one dose after 64, one before if risk factors), influenza yearly and hepatitis B vaccines (if high risk -  end stage renal disease, IV drugs, homosexual men, live in home for mentally retarded, hemophilia receiving factors) ASSESSMENT/PLAN: prenar 63 today      Screening mammograph (yearly if >40) ASSESSMENT/PLAN:  done      Screening Pap smear/pelvic exam (q2 years) ASSESSMENT/PLAN: n/a      Colorectal cancer screening (FOBT yearly or flex sig q4y or colonoscopy q10y or barium enema q4y) ASSESSMENT/PLAN: done      Diabetes outpatient self-management training services ASSESSMENT/PLAN: done      Bone mass measurements(covered q2y if indicated - estrogen def, osteoporosis, hyperparathyroid, vertebral abnormalities, osteoporosis or steroids) ASSESSMENT/PLAN: ordered      Screening for glaucoma(q1y if high risk - diabetes, FH, AA and >  50 or hispanic and > 65) ASSESSMENT/PLAN: sees opthomologist      Medical nutritional therapy for individuals with diabetes or renal disease ASSESSMENT/PLAN: done      Cardiovascular screening blood tests (lipids q5y) ASSESSMENT/PLAN: done      Diabetes screening tests ASSESSMENT/PLAN: done

## 2014-09-22 ENCOUNTER — Encounter: Payer: Self-pay | Admitting: *Deleted

## 2014-10-06 ENCOUNTER — Inpatient Hospital Stay: Admission: RE | Admit: 2014-10-06 | Payer: Medicare Other | Source: Ambulatory Visit

## 2014-10-25 ENCOUNTER — Other Ambulatory Visit: Payer: Self-pay | Admitting: Family Medicine

## 2014-12-29 ENCOUNTER — Ambulatory Visit (INDEPENDENT_AMBULATORY_CARE_PROVIDER_SITE_OTHER): Payer: Medicare Other | Admitting: Family Medicine

## 2014-12-29 ENCOUNTER — Encounter: Payer: Self-pay | Admitting: Family Medicine

## 2014-12-29 VITALS — BP 118/72 | HR 72 | Temp 98.3°F | Ht 60.5 in | Wt 122.5 lb

## 2014-12-29 DIAGNOSIS — F3342 Major depressive disorder, recurrent, in full remission: Secondary | ICD-10-CM

## 2014-12-29 DIAGNOSIS — E785 Hyperlipidemia, unspecified: Secondary | ICD-10-CM | POA: Diagnosis not present

## 2014-12-29 DIAGNOSIS — Z23 Encounter for immunization: Secondary | ICD-10-CM

## 2014-12-29 DIAGNOSIS — E119 Type 2 diabetes mellitus without complications: Secondary | ICD-10-CM | POA: Diagnosis not present

## 2014-12-29 LAB — LIPID PANEL
Cholesterol: 211 mg/dL — ABNORMAL HIGH (ref 0–200)
HDL: 69.5 mg/dL (ref >39.00)
LDL Cholesterol: 106 mg/dL — ABNORMAL HIGH (ref 0–99)
NonHDL: 141.19
TRIGLYCERIDES: 177 mg/dL — AB (ref 0.0–149.0)
Total CHOL/HDL Ratio: 3
VLDL: 35.4 mg/dL (ref 0.0–40.0)

## 2014-12-29 LAB — HEMOGLOBIN A1C: Hgb A1c MFr Bld: 6.6 % — ABNORMAL HIGH (ref 4.6–6.5)

## 2014-12-29 NOTE — Patient Instructions (Signed)
BEFORE YOU LEAVE: -flu shot -labs -schedule follow up in 4-5 months  -We have ordered labs or studies at this visit. It can take up to 1-2 weeks for results and processing. We will contact you with instructions IF your results are abnormal. Normal results will be released to your [facility]. If you have not heard from [place] or can not find your results in [facility] in 2 weeks please contact our office.  We recommend the following healthy lifestyle measures: - eat a healthy whole foods diet consisting of regular small meals composed of vegetables, fruits, beans, nuts, seeds, healthy meats such as white chicken and fish and whole grains.  - avoid sweets, white starchy foods, fried foods, fast food, processed foods, sodas, red meet and other fattening foods.  - get a least 150-300 minutes of aerobic exercise per week.

## 2014-12-29 NOTE — Progress Notes (Signed)
Pre visit review using our clinic review tool, if applicable. No additional management support is needed unless otherwise documented below in the visit note. 

## 2014-12-29 NOTE — Progress Notes (Signed)
HPI:  Depression: -chronic, initially presented to me 02/2014 -fearful of medications but opted for low dose lexapro and advised CBT - she reports she is doing really well on the very low dose lexpro with symptoms resolved -wishes to continue medication -denies: SI, depression  DM: -well controlled on last check -no regular exercise; reports diet is ok - tries to eat vegetables, tries to watch portion sizes -meds: metformin 500mg  bid -eye exam: has eye exam scheduled for next month -denies:vision changes, polyuria, polydipsia, hypoglycemia  HLD: -started pravastatin 20mg  06/2014 -denies: hx statin intol, leg cramps, cog changes   ROS: See pertinent positives and negatives per HPI.  Past Medical History  Diagnosis Date  . Diabetes mellitus without complication ([place])     No past surgical history on file.  No family history on file.  Social History   Social History  . Marital Status: Married    Spouse Name: N/A  . Number of Children: N/A  . Years of Education: N/A   Social History Main Topics  . Smoking status: Never Smoker   . Smokeless tobacco: Never Used  . Alcohol Use: Yes     Comment: occ  . Drug Use: No  . Sexual Activity: Not Asked   Other Topics Concern  . 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  B Complex-C (SUPER B COMPLEX PO), Take by mouth., Disp: , Rfl:  .  CRANBERRY PO, Take by mouth., Disp: , Rfl:  .  Cyanocobalamin (VITAMIN B-12 CR PO), Take by mouth., Disp: , Rfl:  .  escitalopram (LEXAPRO) 5 MG tablet, Take 1 tablet (5 mg total) by mouth daily., Disp: 90 tablet, Rfl: 3 .  metFORMIN (GLUCOPHAGE) 500 MG tablet, TAKE 1 TABLET (500 MG TOTAL) BY MOUTH 2 (TWO) TIMES DAILY WITH A MEAL., Disp: 180  tablet, Rfl: 3 .  pravastatin (PRAVACHOL) 20 MG tablet, Take 1 tablet (20 mg total) by mouth daily., Disp: 90 tablet, Rfl: 3  EXAM:  Filed Vitals:   12/29/14 0852  BP: 118/72  Pulse: 72  Temp: 98.3 F (36.8 C)    Body mass index is 23.52 kg/(m^2).  GENERAL: vitals reviewed and listed above, alert, oriented, appears well hydrated and in no acute distress  HEENT: atraumatic, conjunttiva clear, no obvious abnormalities on inspection of external nose and ears  NECK: no obvious masses on inspection  LUNGS: clear to auscultation bilaterally, no wheezes, rales or rhonchi, good air movement  CV: HRRR, no peripheral edema  MS: moves all extremities without noticeable abnormality  PSYCH: pleasant and cooperative, no obvious depression or anxiety  ASSESSMENT AND PLAN:  Discussed the following assessment and plan:  Recurrent major depressive disorder, in full remission ([place])  Type 2 diabetes mellitus without complication, without long-term current use of insulin ([place]) - Plan: Hemoglobin A1c  Hyperlipemia - Plan: Lipid Panel  -labs -lifestyle recs advised -flu shot -Patient advised to return or notify a doctor immediately if symptoms worsen or persist or new concerns arise.  Patient Instructions  BEFORE YOU LEAVE: -flu shot -labs -schedule follow up in 4-5 months  -We have ordered labs or studies at this visit. It can take up  to 1-2 weeks for results and processing. We will contact you with instructions IF your results are abnormal. Normal results will be released to your [facility]. If you have not heard from [place] or can not find your results in [facility] in 2 weeks please contact our office.  We recommend the following healthy lifestyle measures: - eat a healthy whole foods diet consisting of regular small meals composed of vegetables, fruits, beans, nuts, seeds, healthy meats such as white chicken and fish and whole grains.  - avoid sweets, white starchy foods, fried foods, fast  food, processed foods, sodas, red meet and other fattening foods.  - get a least 150-300 minutes of aerobic exercise per week.            Colin Benton R.

## 2015-01-08 ENCOUNTER — Other Ambulatory Visit: Payer: Self-pay | Admitting: Family Medicine

## 2015-01-19 ENCOUNTER — Other Ambulatory Visit: Payer: Self-pay | Admitting: Family Medicine

## 2015-04-27 ENCOUNTER — Other Ambulatory Visit: Payer: Self-pay | Admitting: Family Medicine

## 2015-05-28 ENCOUNTER — Encounter: Payer: Self-pay | Admitting: Family Medicine

## 2015-05-28 ENCOUNTER — Ambulatory Visit (INDEPENDENT_AMBULATORY_CARE_PROVIDER_SITE_OTHER): Payer: Medicare Other | Admitting: Family Medicine

## 2015-05-28 VITALS — BP 122/60 | HR 80 | Temp 98.1°F | Ht 60.5 in | Wt 120.4 lb

## 2015-05-28 DIAGNOSIS — F325 Major depressive disorder, single episode, in full remission: Secondary | ICD-10-CM | POA: Diagnosis not present

## 2015-05-28 DIAGNOSIS — M25512 Pain in left shoulder: Secondary | ICD-10-CM

## 2015-05-28 DIAGNOSIS — E119 Type 2 diabetes mellitus without complications: Secondary | ICD-10-CM | POA: Diagnosis not present

## 2015-05-28 DIAGNOSIS — E785 Hyperlipidemia, unspecified: Secondary | ICD-10-CM

## 2015-05-28 DIAGNOSIS — M25511 Pain in right shoulder: Secondary | ICD-10-CM

## 2015-05-28 HISTORY — DX: Major depressive disorder, single episode, in full remission: F32.5

## 2015-05-28 LAB — BASIC METABOLIC PANEL
BUN: 16 mg/dL (ref 6–23)
CHLORIDE: 102 meq/L (ref 96–112)
CO2: 30 meq/L (ref 19–32)
CREATININE: 0.8 mg/dL (ref 0.40–1.20)
Calcium: 9.7 mg/dL (ref 8.4–10.5)
GFR: 74.2 mL/min (ref >60.00)
GLUCOSE: 83 mg/dL (ref 70–99)
POTASSIUM: 4 meq/L (ref 3.5–5.1)
SODIUM: 138 meq/L (ref 135–145)

## 2015-05-28 LAB — HEMOGLOBIN A1C: Hgb A1c MFr Bld: 6.6 % — ABNORMAL HIGH (ref 4.6–6.5)

## 2015-05-28 NOTE — Progress Notes (Signed)
Pre visit review using our clinic review tool, if applicable. No additional management support is needed unless otherwise documented below in the visit note. 

## 2015-05-28 NOTE — Patient Instructions (Signed)
Before you leave: -Rotator cuff exercises -Schedule Medicare annual wellness visit in the end of June or the beginning of July, come fasting and we will plan to do lab work that day, you may have plenty of water -Labs  Please do the exercises for the shoulder 3-4 days per week. Please call in 1-2 months if this is worsening or is not improving.  We recommend the following healthy lifestyle measures: - eat a healthy whole foods diet consisting of regular small meals composed of vegetables, fruits, beans, nuts, seeds, healthy meats such as white chicken and fish and whole grains.  - avoid sweets, white starchy foods, fried foods, fast food, processed foods, sodas, red meet and other fattening foods.  - get a least 150-300 minutes of aerobic exercise per week.

## 2015-05-28 NOTE — Progress Notes (Signed)
HPI:  Gabrielle Robinson is a pleasant 75 year old here for a follow-up visit:  Depression: -chronic, initially presented to me 02/2014 -fearful of medications but opted for low dose lexapro and advised CBT  - she reports she is doing great -denies: SI, depression  DM: -well controlled on last check -no regular exercise; reports diet is ok - tries to eat vegetables, tries to watch portion sizes -meds: metformin 500mg  bid -eye exam: has eye exam scheduled for next month -denies:vision changes, polyuria, polydipsia, hypoglycemia -Foot exam: Today  HLD: -started pravastatin 20mg  06/2014 -denies: hx statin intol, leg cramps, cog changes  Bilateral shoulder pain: -This is a new problem, she reports has been mild to moderate, intermittent for several months -Pain is in the lateral shoulder, left worse than right, worse with certain abduction and extension type of movements -Denies: Weakness, numbness, radiation, and trauma   ROS: See pertinent positives and negatives per HPI.  Past Medical History  Diagnosis Date  . Diabetes mellitus without complication ([place])     No past surgical history on file.  No family history on file.  Social History   Social History  . Marital Status: Married    Spouse Name: N/A  . Number of Children: N/A  . Years of Education: N/A   Social History Main Topics  . Smoking status: Never Smoker   . Smokeless tobacco: Never Used  . Alcohol Use: Yes     Comment: occ  . Drug Use: No  . Sexual Activity: Not Asked   Other Topics Concern  . 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  B Complex-C (SUPER B COMPLEX PO), Take by mouth., Disp: , Rfl:  .  CRANBERRY PO, Take by mouth., Disp: , Rfl:  .   Cyanocobalamin (VITAMIN B-12 CR PO), Take by mouth., Disp: , Rfl:  .  escitalopram (LEXAPRO) 5 MG tablet, TAKE 1 TABLET BY MOUTH DAILY., Disp: 90 tablet, Rfl: 1 .  metFORMIN (GLUCOPHAGE) 500 MG tablet, TAKE 1 TABLET (500 MG TOTAL) BY MOUTH 2 (TWO) TIMES DAILY WITH A MEAL., Disp: 180 tablet, Rfl: 3 .  pravastatin (PRAVACHOL) 20 MG tablet, Take 1 tablet (20 mg total) by mouth daily., Disp: 90 tablet, Rfl: 3  EXAM:  Filed Vitals:   05/28/15 1108  BP: 122/60  Pulse: 80  Temp: 98.1 F (36.7 C)    Body mass index is 23.12 kg/(m^2).  GENERAL: vitals reviewed and listed above, alert, oriented, appears well hydrated and in no acute distress  HEENT: atraumatic, conjunttiva clear, no obvious abnormalities on inspection of external nose and ears  NECK: no obvious masses on inspection  LUNGS: clear to auscultation bilaterally, no wheezes, rales or rhonchi, good air movement  CV: HRRR, no peripheral edema  MS: moves all extremities without noticeable abnormality, Normal inspection in range of motion of both upper extremities and shoulders, tenderness to palpation on the left in the rotator cuff tendinous attachments to the humerus, normal strength in bilateral upper extremities, positive impingement sign on the left shoulder, pain with empty can, but no weakness, negative , neg [place], neurovascularly intact  PSYCH: pleasant and cooperative, no obvious depression or anxiety  ASSESSMENT AND  PLAN:  Discussed the following assessment and plan:  Bilateral shoulder pain - recs for HEP for suspected RTC tendonopathy 05/3015 -Suspect rotator cuff tendinopathy, conservative treatment with when necessary over-the-counter analgesics and a home exercise program today -Advised to call in 1-2 months if not improving, sooner if worsening and we'll plan to have her see sports medicine  Type 2 diabetes mellitus without complication, without long-term current use of insulin ([place]) - Plan: Basic metabolic  panel, Hemoglobin A1c Hyperlipemia Major depressive disorder with single episode, in full remission ([place]) -Labs -Lifestyle recs -Diabetic foot exam -Medications reviewed and updated -Medicare annual wellness visit in June -Patient advised to return or notify a doctor immediately if symptoms worsen or persist or new concerns arise.  Patient Instructions  Before you leave: -Rotator cuff exercises -Schedule Medicare annual wellness visit in the end of June or the beginning of July, come fasting and we will plan to do lab work that day, you may have plenty of water -Labs  Please do the exercises for the shoulder 3-4 days per week. Please call in 1-2 months if this is worsening or is not improving.  We recommend the following healthy lifestyle measures: - eat a healthy whole foods diet consisting of regular small meals composed of vegetables, fruits, beans, nuts, seeds, healthy meats such as white chicken and fish and whole grains.  - avoid sweets, white starchy foods, fried foods, fast food, processed foods, sodas, red meet and other fattening foods.  - get a least 150-300 minutes of aerobic exercise per week.       Colin Benton R.

## 2015-08-03 ENCOUNTER — Other Ambulatory Visit: Payer: Self-pay | Admitting: Family Medicine

## 2015-08-18 ENCOUNTER — Encounter: Payer: Self-pay | Admitting: Internal Medicine

## 2015-10-05 ENCOUNTER — Telehealth: Payer: Self-pay | Admitting: Family Medicine

## 2015-10-05 NOTE — Telephone Encounter (Signed)
Pt returned your call did not no the nature of the call.

## 2015-10-06 NOTE — Telephone Encounter (Signed)
I left a message for the pt to return my call. 

## 2015-10-07 ENCOUNTER — Encounter: Payer: Medicare Other | Admitting: Family Medicine

## 2015-10-22 ENCOUNTER — Encounter: Payer: Medicare Other | Admitting: Family Medicine

## 2015-11-02 ENCOUNTER — Ambulatory Visit (INDEPENDENT_AMBULATORY_CARE_PROVIDER_SITE_OTHER): Payer: Medicare Other | Admitting: Family Medicine

## 2015-11-02 ENCOUNTER — Encounter: Payer: Self-pay | Admitting: Family Medicine

## 2015-11-02 VITALS — BP 120/68 | HR 67 | Temp 98.2°F | Ht 60.5 in | Wt 120.9 lb

## 2015-11-02 DIAGNOSIS — E785 Hyperlipidemia, unspecified: Secondary | ICD-10-CM

## 2015-11-02 DIAGNOSIS — E119 Type 2 diabetes mellitus without complications: Secondary | ICD-10-CM

## 2015-11-02 DIAGNOSIS — F325 Major depressive disorder, single episode, in full remission: Secondary | ICD-10-CM

## 2015-11-02 DIAGNOSIS — Z0000 Encounter for general adult medical examination without abnormal findings: Secondary | ICD-10-CM | POA: Diagnosis not present

## 2015-11-02 LAB — LIPID PANEL
CHOL/HDL RATIO: 3
Cholesterol: 217 mg/dL — ABNORMAL HIGH (ref 0–200)
HDL: 64.8 mg/dL (ref >39.00)
LDL CALC: 123 mg/dL — AB (ref 0–99)
NONHDL: 152.31
TRIGLYCERIDES: 146 mg/dL (ref 0.0–149.0)
VLDL: 29.2 mg/dL (ref 0.0–40.0)

## 2015-11-02 LAB — BASIC METABOLIC PANEL
BUN: 14 mg/dL (ref 6–23)
CHLORIDE: 102 meq/L (ref 96–112)
CO2: 29 meq/L (ref 19–32)
Calcium: 9.9 mg/dL (ref 8.4–10.5)
Creatinine, Ser: 0.76 mg/dL (ref 0.40–1.20)
GFR: 78.63 mL/min (ref >60.00)
GLUCOSE: 138 mg/dL — AB (ref 70–99)
POTASSIUM: 4.7 meq/L (ref 3.5–5.1)
Sodium: 139 mEq/L (ref 135–145)

## 2015-11-02 LAB — MICROALBUMIN / CREATININE URINE RATIO
Creatinine,U: 219.7 mg/dL
MICROALB/CREAT RATIO: 0.5 mg/g (ref 0.0–30.0)
Microalb, Ur: 1.2 mg/dL (ref 0.0–1.9)

## 2015-11-02 LAB — HEMOGLOBIN A1C: HEMOGLOBIN A1C: 6.5 % (ref 4.6–6.5)

## 2015-11-02 MED ORDER — PRAVASTATIN SODIUM 40 MG PO TABS: 40.0000 mg | ORAL_TABLET | Freq: Every day | ORAL | 1 refills | Status: DC

## 2015-11-02 NOTE — Progress Notes (Signed)
Pre visit review using our clinic review tool, if applicable. No additional management support is needed unless otherwise documented below in the visit note. 

## 2015-11-02 NOTE — Patient Instructions (Signed)
BEFORE YOU LEAVE: -follow up: 4 months -labs -check on her DEXA order and see if needs new order to have done  Please schedule your diabetic eye exam.  Please do your bone density test.  Mammogram yearly.  Vit D3 [phone] IU daily  Do your flu shot in the fall. Let us know if you do this elsewhere.  Please consider the shingles vaccine and check with you insurance to see if this is covered for you.  We recommend the following healthy lifestyle: 1) Small portions - eat off of salad plate instead of dinner plate 2) Eat a healthy clean diet with avoidance of (less then 1 serving per week) processed foods, sweetened drinks, white starches, red meat, fast foods and sweets and consisting of: * 5-9 servings per day of fresh or frozen fruits and vegetables (not corn or potatoes, not dried or canned) *nuts and seeds, beans *olives and olive oil *small portions of lean meats such as fish and white chicken  *small portions of whole grains 3)Get at least 150 minutes of sweaty aerobic exercise per week 4)reduce stress - counseling, meditation, relaxation to balance other aspects of your life  We have ordered labs or studies at this visit. It can take up to 1-2 weeks for results and processing. IF results require follow up or explanation, we will call you with instructions. Clinically stable results will be released to your [facility]. If you have not heard from [place] or cannot find your results in [facility] in 2 weeks please contact our office at [phone].  If you are not yet signed up for [facility], please consider signing up.

## 2015-11-02 NOTE — Progress Notes (Signed)
Medicare Annual Preventive Care Visit  (initial annual wellness or annual wellness exam)  Concerns and/or follow up today:  Depression: -chronic, initially presented to me 02/2014 -she opted for low dose lexapro and advised CBT; -she reports she is doing great on te lexapro and feels needs to continue as has mood issues if stops -denies: SI, depression  DM: -well controlled on last check -exercises for 30 minutes 3 days per week; diet "you would not be proud of me" -meds: metformin 500mg  bid -eye exam:  Agrees to set up eye exam -denies:vision changes, polyuria, polydipsia, hypoglycemia  HLD: -started pravastatin 20mg  06/2014 -denies: hx statin intol, leg cramps, cog changes   ROS: negative for report of fevers, unintentional weight loss, vision changes, vision loss, hearing loss or change, chest pain, sob, hemoptysis, melena, hematochezia, hematuria, genital discharge or lesions, falls, bleeding or bruising, loc, thoughts of suicide or self harm, memory loss  1.) Patient-completed health risk assessment  - completed and reviewed, see scanned documentation  2.) Review of Medical History: -PMH, PSH, Family History and current specialty and care providers reviewed and updated and listed below  - see scanned in document in chart and below  Past Medical History:  Diagnosis Date  . Diabetes mellitus without complication ([place])   . Hyperlipemia 01/05/2009   Qualifier: Diagnosis of  By: Arnoldo Morale MD, Balinda Quails   . Major depressive disorder with single episode, in full remission ([place]) 05/28/2015    No past surgical history on file.  Social History   Social History  . Marital status: Married    Spouse name: N/A  . Number of children: N/A  . Years of education: N/A   Occupational History  . Not on file.   Social History Main Topics  . Smoking status: Never Smoker  . Smokeless tobacco: Never Used  . Alcohol use Yes     Comment: occ  . Drug use: No  . Sexual activity: Not on  file   Other Topics Concern  . 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  Current Outpatient Prescriptions on File Prior to Visit  Medication Sig Dispense Refill  . B Complex-C (SUPER B COMPLEX PO) Take by mouth.    . CRANBERRY PO Take by mouth.    . Cyanocobalamin (VITAMIN B-12 CR PO) Take by mouth.    . escitalopram (LEXAPRO) 5 MG tablet TAKE 1 TABLET BY MOUTH DAILY. 90 tablet 1  . metFORMIN (GLUCOPHAGE) 500 MG tablet TAKE 1 TABLET (500 MG TOTAL) BY MOUTH 2 (TWO) TIMES DAILY WITH A MEAL. 180 tablet 3  . pravastatin (PRAVACHOL) 20 MG tablet TAKE 1 TABLET (20 MG TOTAL) BY MOUTH DAILY. 90 tablet 1   No current facility-administered medications on file prior to visit.      3.) Review of functional ability and level of safety:  Any difficulty hearing?  NO  History of falling?  NO  Any trouble with IADLs - using a phone, using transportation, grocery shopping, preparing meals, doing housework, doing laundry, taking medications and managing money? NO  Advance Directives? No  See summary of recommendations in Patient Instructions below.  4.) Physical Exam Vitals:  11/02/15 1136  BP: 120/68  Pulse: 67  Temp: 98.2 F (36.8 C)   Estimated body mass index is 23.22 kg/m as calculated from the following:   Height as of this encounter: 5' 0.5" (1.537 m).   Weight as of this encounter: 120 lb 14.4 oz (54.8 kg).  EKG (optional): deferred  GENERAL: vitals reviewed and listed below, alert, oriented, appears well hydrated and in no acute distress  HEENT: head atraumatic, PERRLA, normal appearance of eyes, ears, nose and mouth. moist mucus membranes. Visual acuity grossly intact.  NECK: supple, no masses or  lymphadenopathy  LUNGS: clear to auscultation bilaterally, no rales, rhonchi or wheeze  CV: HRRR, no peripheral edema or cyanosis, normal pedal pulses  BREAST: declined  ABDOMEN: bowel sounds normal, soft, non tender to palpation, no masses, no rebound or guarding  GU: declined  SKIN: no rash or abnormal lesions, she declined a full skin exam  MS: normal gait, moves all extremities normally  NEURO: normal gait, speech and thought processing grossly intact, muscle tone grossly intact throughout  PSYCH: normal affect, pleasant and cooperative  Cognitive function grossly intact  See patient instructions for recommendations.  Education and counseling regarding the above review of health provided with a plan for the following: -see scanned patient completed form for further details -fall prevention strategies discussed  -healthy lifestyle discussed -importance and resources for completing advanced directives discussed -see patient instructions below for any other recommendations provided  4)The following written screening schedule of preventive measures were reviewed with assessment and plan made per below, orders and patient instructions:      Alcohol screening done     Obesity Screening and counseling done     STI screening (Hep C if born [birth year]) offered and per pt wishes     Tobacco Screening done        Pneumococcal (PPSV23 -one dose after 64, one before if risk factors), influenza yearly and hepatitis B vaccines (if high risk - end stage renal disease, IV drugs, homosexual men, live in home for mentally retarded, hemophilia receiving factors) ASSESSMENT/PLAN: done if applicable      Screening mammograph (yearly if >40) ASSESSMENT/PLAN: 05/2014 last done      Screening Pap smear/pelvic exam (q2 years) ASSESSMENT/PLAN: n/a, declined      Colorectal cancer screening (FOBT yearly or flex sig q4y or colonoscopy q10y or barium enema q4y) ASSESSMENT/PLAN: 01/2009, normal       Diabetes outpatient self-management training services ASSESSMENT/PLAN: utd or done      Bone mass measurements(covered q2y if indicated - estrogen def, osteoporosis, hyperparathyroid, vertebral abnormalities, osteoporosis or steroids) ASSESSMENT/PLAN:ordered last year, but not done - she admits she knows she should have done it and agrees to do - will have assistant reorder or provider her with number to call.      Screening for glaucoma(q1y if high risk - diabetes, FH, AA and > 50 or hispanic and > 65) ASSESSMENT/PLAN: utd or advised      Medical nutritional therapy for individuals with diabetes or renal disease ASSESSMENT/PLAN: see orders      Cardiovascular screening blood tests (lipids q5y) ASSESSMENT/PLAN: see orders and labs      Diabetes screening tests ASSESSMENT/PLAN: see orders and labs   7.) Summary:   Medicare annual wellness visit, subsequent -risk factors and conditions per above assessment were discussed and treatment, recommendations and referrals were offered per documentation above and orders and patient instructions.  Hyperlipemia - Plan: Lipid Panel -lifestyle recs, continue statin  Type 2 diabetes mellitus without complication, without long-term current use of insulin (HCC) - Plan: Hemoglobin 123456, Basic metabolic panel, Microalbumin/Creatinine Ratio, Urine -lifestyle recs, continue statin  Major depressive disorder with single episode, in full remission ([place]) -continue lexapro, doing well  Patient Instructions  BEFORE YOU LEAVE: -follow up: 4 months -labs -check on her DEXA order and see if needs new order to have done  Please schedule your diabetic eye exam.  Please do your bone density test.  Mammogram yearly.  Vit D3 [phone] daily  Do your flu shot in the fall. Let us know if you do this elsewhere.  Please consider the shingles vaccine and check with you insurance to see if this is covered for you.  We recommend the following healthy  lifestyle: 1) Small portions - eat off of salad plate instead of dinner plate 2) Eat a healthy clean diet with avoidance of (less then 1 serving per week) processed foods, sweetened drinks, white starches, red meat, fast foods and sweets and consisting of: * 5-9 servings per day of fresh or frozen fruits and vegetables (not corn or potatoes, not dried or canned) *nuts and seeds, beans *olives and olive oil *small portions of lean meats such as fish and white chicken  *small portions of whole grains 3)Get at least 150 minutes of sweaty aerobic exercise per week 4)reduce stress - counseling, meditation, relaxation to balance other aspects of your life  We have ordered labs or studies at this visit. It can take up to 1-2 weeks for results and processing. IF results require follow up or explanation, we will call you with instructions. Clinically stable results will be released to your [facility]. If you have not heard from [place] or cannot find your results in [facility] in 2 weeks please contact our office at [phone].  If you are not yet signed up for [facility], please consider signing up.          Lucretia Kern., DO          HPI:  Here for CPE:  -Concerns and/or follow up today: none  -Diet: variety of foods, balance and well rounded, larger portion sizes  -Exercise: no regular exercise  -Taking folic acid, vitamin D or calcium: no  -Diabetes and Dyslipidemia Screening:  -Hx of HTN: no  -Vaccines: UTD  -pap history:  -FDLMP:  -sexual activity: yes, female partner, no new partners  -wants STI testing (Hep C if born [birth year]): no  -FH breast, colon or ovarian ca: see FH Last mammogram: Last colon cancer screening:  Breast Ca Risk Assessment: -SolutionApps.it  Genetic Counseling Screen: Http://www.breastcancergenesscreen.org/startScreen.aspx  FRAX (50-65):  DEXA (>/= 65):   -Alcohol, Tobacco, drug use: see social history  Review of Systems  - no fevers, unintentional weight loss, vision loss, hearing loss, chest pain, sob, hemoptysis, melena, hematochezia, hematuria, genital discharge, changing or concerning skin lesions, bleeding, bruising, loc, thoughts of self harm or SI  Past Medical History:  Diagnosis Date  . Diabetes mellitus without complication ([place])   . Hyperlipemia 01/05/2009   Qualifier: Diagnosis of  By: Arnoldo Morale MD, Balinda Quails   . Major depressive disorder with single episode, in full remission ([place]) 05/28/2015    No past surgical history on file.  No family history on file.  Social History   Social History  . Marital status: Married    Spouse name: N/A  . Number of children: N/A  . Years of education: N/A   Social History Main Topics  .  Smoking status: Never Smoker  . Smokeless tobacco: Never Used  . Alcohol use Yes     Comment: occ  . Drug use: No  . Sexual activity: Not Asked   Other Topics Concern  . 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  B Complex-C (SUPER B COMPLEX PO), Take by mouth., Disp: , Rfl:  .  CRANBERRY PO, Take by mouth., Disp: , Rfl:  .  Cyanocobalamin (VITAMIN B-12 CR PO), Take by mouth., Disp: , Rfl:  .  escitalopram (LEXAPRO) 5 MG tablet, TAKE 1 TABLET BY MOUTH DAILY., Disp: 90 tablet, Rfl: 1 .  metFORMIN (GLUCOPHAGE) 500 MG tablet, TAKE 1 TABLET (500 MG TOTAL) BY MOUTH 2 (TWO) TIMES DAILY WITH A MEAL., Disp: 180 tablet, Rfl: 3 .  pravastatin (PRAVACHOL) 20 MG tablet, TAKE 1 TABLET (20 MG TOTAL) BY MOUTH DAILY., Disp: 90 tablet, Rfl: 1  EXAM:  Vitals:   11/02/15 1136  BP: 120/68  Pulse: 67  Temp: 98.2 F (36.8 C)    GENERAL: vitals reviewed and listed below, alert, oriented, appears well hydrated and in no acute  distress  HEENT: head atraumatic, PERRLA, normal appearance of eyes, ears, nose and mouth. moist mucus membranes.  NECK: supple, no masses or lymphadenopathy  LUNGS: clear to auscultation bilaterally, no rales, rhonchi or wheeze  CV: HRRR, no peripheral edema or cyanosis, normal pedal pulses  BREAST: normal appearance - no lesions or discharge, on palpation normal breast tissue without any suspicious masses  ABDOMEN: bowel sounds normal, soft, non tender to palpation, no masses, no rebound or guarding  GU: normal appearance of external genitalia - no lesions or masses, normal vaginal mucosa - no abnormal discharge, normal appearance of cervix - no lesions or abnormal discharge, no masses or tenderness on palpation of uterus and ovaries.  RECTAL: refused  SKIN: no rash or abnormal lesions  MS: normal gait, moves all extremities normally  NEURO: normal gait, speech and thought processing grossly intact, muscle tone grossly intact throughout  PSYCH: normal affect, pleasant and cooperative  ASSESSMENT AND PLAN:  Discussed the following assessment and plan:  There are no diagnoses linked to this encounter.  -Discussed and advised all [place] preventive services health task force level A and B recommendations for age, sex and risks.  -Advised at least 150 minutes of exercise per week and a healthy diet with avoidance of (less then 1 serving per week) processed foods, white starches, red meat, fast foods and sweets and consisting of: * 5-9 servings of fresh fruits and vegetables (not corn or potatoes) *nuts and seeds, beans *olives and olive oil *lean meats such as fish and white chicken  *whole grains  -labs, studies and vaccines per orders this encounter  Orders Placed This Encounter  Procedures  . Lipid Panel  . Hemoglobin A1c  . Basic metabolic panel  . Microalbumin/Creatinine Ratio, Urine    Patient advised to return to clinic immediately if symptoms worsen or persist or  new concerns.  Patient Instructions  BEFORE YOU LEAVE: -follow up: 4 months -labs -check on her DEXA order and see if needs new order to have done  Please schedule your diabetic  eye exam.  Please do your bone density test.  Mammogram yearly.  Vit D3 [phone] daily  Do your flu shot in the fall. Let us know if you do this elsewhere.  Please consider the shingles vaccine and check with you insurance to see if this is covered for you.  We recommend the following healthy lifestyle: 1) Small portions - eat off of salad plate instead of dinner plate 2) Eat a healthy clean diet with avoidance of (less then 1 serving per week) processed foods, sweetened drinks, white starches, red meat, fast foods and sweets and consisting of: * 5-9 servings per day of fresh or frozen fruits and vegetables (not corn or potatoes, not dried or canned) *nuts and seeds, beans *olives and olive oil *small portions of lean meats such as fish and white chicken  *small portions of whole grains 3)Get at least 150 minutes of sweaty aerobic exercise per week 4)reduce stress - counseling, meditation, relaxation to balance other aspects of your life  We have ordered labs or studies at this visit. It can take up to 1-2 weeks for results and processing. IF results require follow up or explanation, we will call you with instructions. Clinically stable results will be released to your [facility]. If you have not heard from [place] or cannot find your results in [facility] in 2 weeks please contact our office at [phone].  If you are not yet signed up for [facility], please consider signing up.          No Follow-up on file.  Colin Benton R., DO

## 2015-11-02 NOTE — Telephone Encounter (Signed)
Patient was seen today by Dr.Kim.

## 2015-11-02 NOTE — Addendum Note (Signed)
Addended by: Agnes Lawrence on: 11/02/2015 04:59 PM   Modules accepted: Orders

## 2016-01-05 ENCOUNTER — Other Ambulatory Visit: Payer: Medicare Other

## 2016-01-06 ENCOUNTER — Other Ambulatory Visit: Payer: Self-pay | Admitting: Emergency Medicine

## 2016-01-06 ENCOUNTER — Telehealth: Payer: Self-pay | Admitting: Family Medicine

## 2016-01-06 DIAGNOSIS — M810 Age-related osteoporosis without current pathological fracture: Secondary | ICD-10-CM

## 2016-01-06 NOTE — Telephone Encounter (Signed)
Orders placed called pt but husband states she will not be in till this evening. Informed pt's husband to have pt return call to office. Verbalized understanding.

## 2016-01-06 NOTE — Telephone Encounter (Signed)
Pt has a bone density scheduled on Thursday at 11 am but there is not an order in the system. Can you put the order in? Thanks!

## 2016-01-07 ENCOUNTER — Ambulatory Visit (INDEPENDENT_AMBULATORY_CARE_PROVIDER_SITE_OTHER)
Admission: RE | Admit: 2016-01-07 | Discharge: 2016-01-07 | Disposition: A | Discharge status: Home / Self Care | Payer: Medicare Other | Source: Ambulatory Visit | Attending: Family Medicine | Admitting: Family Medicine

## 2016-01-07 DIAGNOSIS — M810 Age-related osteoporosis without current pathological fracture: Secondary | ICD-10-CM

## 2016-02-08 ENCOUNTER — Other Ambulatory Visit: Payer: Self-pay | Admitting: Family Medicine

## 2016-02-29 ENCOUNTER — Ambulatory Visit: Payer: Medicare Other | Admitting: Family Medicine

## 2016-02-29 ENCOUNTER — Other Ambulatory Visit: Payer: Self-pay | Admitting: Family Medicine

## 2016-03-06 NOTE — Progress Notes (Signed)
HPI:  AWV 10/2015 Gabrielle Robinson is a pleasant 75 yo here for follow up. She has a PMH significant for DM, Depression and hyperlipidemia. We increased her pravastatin after her last labs check. Reports she is doing great. She is taking the increased dose of the cholesterol medicine. She is getting some exercise and feels that she eats fairly healthy. Her mood has been good.  See below for summary health. Due for eye exam and repeat labs. She agrees to schedule her eye exam. She does not want to do labs today as she is fasting and prefers to wait until her next visit rather than scheduling another lab visit.  Depression: -chronic, initially presented to me 02/2014 -she opted for low dose lexapro and advised CBT  DM: -well controlled on last check -exercises for 30 minutes 3 days per week; diet ok -meds: metformin 500mg  bid  HLD: -increased pravastatin 10/2015 -denies: hx statin intol, leg cramps, cog changes   ROS: See pertinent positives and negatives per HPI.  Past Medical History:  Diagnosis Date  . Diabetes mellitus without complication ([place])   . Hyperlipemia 01/05/2009   Qualifier: Diagnosis of  By: Arnoldo Morale MD, Balinda Quails   . Major depressive disorder with single episode, in full remission ([place]) 05/28/2015    No past surgical history on file.  No family history on file.  Social History   Social History  . Marital status: Married    Spouse name: N/A  . Number of children: N/A  . Years of education: N/A   Social History Main Topics  . Smoking status: Never Smoker  . Smokeless tobacco: Never Used  . Alcohol use Yes     Comment: occ  . Drug use: No  . Sexual activity: Not Asked   Other Topics Concern  . 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  B Complex-C (SUPER B COMPLEX PO), Take by mouth., Disp: , Rfl:  .  Cyanocobalamin (VITAMIN B-12 CR PO), Take by mouth., Disp: , Rfl:  .  escitalopram (LEXAPRO) 5 MG tablet, TAKE 1 TABLET BY MOUTH DAILY., Disp: 90 tablet, Rfl: 1 .  metFORMIN (GLUCOPHAGE) 500 MG tablet, TAKE 1 TABLET (500 MG TOTAL) BY MOUTH 2 (TWO) TIMES DAILY WITH A MEAL., Disp: 180 tablet, Rfl: 3 .  metFORMIN (GLUCOPHAGE) 500 MG tablet, TAKE 1 TABLET BY MOUTH TWICE DAILY WITH MEALS, Disp: 180 tablet, Rfl: 2 .  pravastatin (PRAVACHOL) 40 MG tablet, Take 1 tablet (40 mg total) by mouth daily., Disp: 90 tablet, Rfl: 1  EXAM:  Vitals:   03/07/16 1119  BP: 122/62  Pulse: 70  Temp: 97.5 F (36.4 C)    Body mass index is 23.03 kg/m.  GENERAL: vitals reviewed and listed above, alert, oriented, appears well hydrated and in no acute distress  HEENT: atraumatic, conjunttiva clear, no obvious abnormalities on inspection of external nose and ears  NECK: no obvious masses on inspection  LUNGS: clear to auscultation bilaterally, no wheezes, rales or rhonchi, good air movement  CV: HRRR, no peripheral edema  MS: moves all extremities without noticeable abnormality  PSYCH: pleasant and cooperative, no obvious depression or anxiety  ASSESSMENT  AND PLAN:  Discussed the following assessment and plan:  Hyperlipidemia, unspecified hyperlipidemia type  Type 2 diabetes mellitus without complication, without long-term current use of insulin (HCC)  Major depressive disorder with single episode, in full remission ([place])  -Lifestyle recommendations -Flu shot today -She does not want to do labs today or schedule a lab visit, we will follow-up in 3 months and do labs then instead -Advised to schedule her diabetic eye exam -Patient advised to return or notify a doctor immediately if symptoms worsen or persist or new concerns arise.  Patient Instructions  BEFORE YOU LEAVE: -flu  shot -follow up: 1) Dr. Maudie Mercury in 3 months - come fasting and we will plan to do labs that day 2) Wellness visit with Manuela Schwartz in August 2018   Please schedule your eye exam.   We recommend the following healthy lifestyle for LIFE: 1) Small portions.   Tip: eat off of a salad plate instead of a dinner plate.  Tip: if you need more or a snack choose fruits, veggies and/or a handful of nuts or seeds.  2) Eat a healthy clean diet.  * Tip: Avoid (less then 1 serving per week): processed foods, sweets, sweetened drinks, white starches (rice, flour, bread, potatoes, pasta, etc), red meat, fast foods, butter  *Tip: CHOOSE instead   * 5-9 servings per day of fresh or frozen fruits and vegetables (but not corn, potatoes, bananas, canned or dried fruit)   *nuts and seeds, beans   *olives and olive oil   *small portions of lean meats such as fish and white chicken    *small portions of whole grains  3)Get at least 150 minutes of sweaty aerobic exercise per week.  4)Reduce stress - consider counseling, meditation and relaxation to balance other aspects of your life.       Colin Benton R., DO

## 2016-03-07 ENCOUNTER — Encounter: Payer: Self-pay | Admitting: Family Medicine

## 2016-03-07 ENCOUNTER — Ambulatory Visit (INDEPENDENT_AMBULATORY_CARE_PROVIDER_SITE_OTHER): Payer: Medicare Other | Admitting: Family Medicine

## 2016-03-07 VITALS — BP 122/62 | HR 70 | Temp 97.5°F | Ht 60.5 in | Wt 119.9 lb

## 2016-03-07 DIAGNOSIS — E785 Hyperlipidemia, unspecified: Secondary | ICD-10-CM | POA: Diagnosis not present

## 2016-03-07 DIAGNOSIS — E119 Type 2 diabetes mellitus without complications: Secondary | ICD-10-CM

## 2016-03-07 DIAGNOSIS — Z23 Encounter for immunization: Secondary | ICD-10-CM | POA: Diagnosis not present

## 2016-03-07 DIAGNOSIS — F325 Major depressive disorder, single episode, in full remission: Secondary | ICD-10-CM | POA: Diagnosis not present

## 2016-03-07 NOTE — Progress Notes (Signed)
Pre visit review using our clinic review tool, if applicable. No additional management support is needed unless otherwise documented below in the visit note. 

## 2016-03-07 NOTE — Patient Instructions (Addendum)
BEFORE YOU LEAVE: -flu shot -follow up: 1) Dr. Maudie Mercury in 3 months - come fasting and we will plan to do labs that day 2) Wellness visit with Manuela Schwartz in August 2018   Please schedule your eye exam.   We recommend the following healthy lifestyle for LIFE: 1) Small portions.   Tip: eat off of a salad plate instead of a dinner plate.  Tip: if you need more or a snack choose fruits, veggies and/or a handful of nuts or seeds.  2) Eat a healthy clean diet.  * Tip: Avoid (less then 1 serving per week): processed foods, sweets, sweetened drinks, white starches (rice, flour, bread, potatoes, pasta, etc), red meat, fast foods, butter  *Tip: CHOOSE instead   * 5-9 servings per day of fresh or frozen fruits and vegetables (but not corn, potatoes, bananas, canned or dried fruit)   *nuts and seeds, beans   *olives and olive oil   *small portions of lean meats such as fish and white chicken    *small portions of whole grains  3)Get at least 150 minutes of sweaty aerobic exercise per week.  4)Reduce stress - consider counseling, meditation and relaxation to balance other aspects of your life.

## 2016-06-05 NOTE — Progress Notes (Signed)
HPI:  Gabrielle Robinson is a pleasant 76 y.o. here for follow up. Chronic medical problems summarized below were reviewed for changes and stability and were updated as needed below. These issues and their treatment remain stable for the most part. Dong well. No complaints. Exercising on regular basis and reports a healthy diet. Mood is great. No depression or anxiety. Denies CP, SOB, DOE, treatment intolerance or new symptoms. Due for labs, foot exam, eye exam. Medicare exam in august.  Depression: -chronic, initially presented to me 02/2014 -she opted for low dose lexapro and advised CBT  DM: -well controlled on last check -exercises for 30 minutes 3 days per week; diet ok -meds: metformin 500mg  bid  HLD: -increased pravastatin 10/2015 -denies: hx statin intol, leg cramps, cog changes  ROS: See pertinent positives and negatives per HPI.  Past Medical History:  Diagnosis Date  . Diabetes mellitus without complication ([place])   . Hyperlipemia 01/05/2009   Qualifier: Diagnosis of  By: Arnoldo Morale MD, Balinda Quails   . Major depressive disorder with single episode, in full remission ([place]) 05/28/2015    No past surgical history on file.  No family history on file.  Social History   Social History  . Marital status: Married    Spouse name: N/A  . Number of children: N/A  . Years of education: N/A   Social History Main Topics  . Smoking status: Never Smoker  . Smokeless tobacco: Never Used  . Alcohol use Yes     Comment: occ  . Drug use: No  . Sexual activity: Not Asked   Other Topics Concern  . 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  B Complex-C (SUPER B COMPLEX PO), Take by mouth., Disp: , Rfl:  .  Cyanocobalamin (VITAMIN  B-12 CR PO), Take by mouth., Disp: , Rfl:  .  escitalopram (LEXAPRO) 5 MG tablet, TAKE 1 TABLET BY MOUTH DAILY., Disp: 90 tablet, Rfl: 1 .  metFORMIN (GLUCOPHAGE) 500 MG tablet, TAKE 1 TABLET (500 MG TOTAL) BY MOUTH 2 (TWO) TIMES DAILY WITH A MEAL., Disp: 180 tablet, Rfl: 3 .  pravastatin (PRAVACHOL) 40 MG tablet, Take 1 tablet (40 mg total) by mouth daily., Disp: 90 tablet, Rfl: 1  EXAM:  Vitals:   06/06/16 1105  BP: 122/76  Pulse: 69  Temp: 98 F (36.7 C)    Body mass index is 22.99 kg/m.  GENERAL: vitals reviewed and listed above, alert, oriented, appears well hydrated and in no acute distress  HEENT: atraumatic, conjunttiva clear, no obvious abnormalities on inspection of external nose and ears  NECK: no obvious masses on inspection  LUNGS: clear to auscultation bilaterally, no wheezes, rales or rhonchi, good air movement  CV: HRRR, no peripheral edema  MS: moves all extremities without noticeable abnormality  PSYCH: pleasant and cooperative, no obvious depression or anxiety  FOOT EXAM: done  ASSESSMENT AND PLAN:  Discussed the following assessment and plan:  Type 2 diabetes mellitus without complication, without long-term current use of insulin (HCC) - Plan: Basic metabolic panel, Hemoglobin A1c  Hyperlipidemia, unspecified hyperlipidemia type - Plan: Lipid panel  Major depressive disorder with single episode, in full remission (  [place])  -foot exam -labs -lifestyle recs -Patient advised to return or notify a doctor immediately if symptoms worsen or persist or new concerns arise.  Patient Instructions  BEFORE YOU LEAVE: -labs -follow up: 1) follow up in 3 months 2) Medicare exam in August with Manuela Schwartz -labs  We have ordered labs or studies at this visit. It can take up to 1-2 weeks for results and processing. IF results require follow up or explanation, we will call you with instructions. Clinically stable results will be released to your [facility]. If you have  not heard from [place] or cannot find your results in [facility] in 2 weeks please contact our office at [phone].  If you are not yet signed up for [facility], please consider signing up.   We recommend the following healthy lifestyle for LIFE: 1) Small portions.   Tip: eat off of a salad plate instead of a dinner plate.  Tip: It is ok to feel hungry after a meal.  Tip: if you need more or a snack choose fruits, veggies and/or a handful of nuts or seeds.  2) Eat a healthy clean diet.  * Tip: Avoid (less then 1 serving per week): processed foods, sweets, sweetened drinks, white starches (rice, flour, bread, potatoes, pasta, etc), red meat, fast foods, butter  *Tip: CHOOSE instead   * 5-9 servings per day of fresh or frozen fruits and vegetables (but not corn, potatoes, bananas, canned or dried fruit)   *nuts and seeds, beans   *olives and olive oil   *small portions of lean meats such as fish and white chicken    *small portions of whole grains  3)Get at least 150 minutes of sweaty aerobic exercise per week.  4)Reduce stress - consider counseling, meditation and relaxation to balance other aspects of your life.  WE NOW OFFER   [facility] FAST TRACK!!!  SAME DAY Appointments for ACUTE CARE  Such as: Sprains, Injuries, cuts, abrasions, rashes, muscle pain, joint pain, back pain Colds, flu, sore throats, headache, allergies, cough, fever  Ear pain, sinus and eye infections Abdominal pain, nausea, vomiting, diarrhea, upset stomach Animal/insect bites  3 Easy Ways to Schedule: Walk-In Scheduling Call in scheduling Mychart Sign-up: [email or web address]                 Colin Benton R., DO

## 2016-06-06 ENCOUNTER — Encounter: Payer: Self-pay | Admitting: Family Medicine

## 2016-06-06 ENCOUNTER — Ambulatory Visit (INDEPENDENT_AMBULATORY_CARE_PROVIDER_SITE_OTHER): Payer: Medicare Other | Admitting: Family Medicine

## 2016-06-06 VITALS — BP 122/76 | HR 69 | Temp 98.0°F | Ht 60.5 in | Wt 119.7 lb

## 2016-06-06 DIAGNOSIS — E119 Type 2 diabetes mellitus without complications: Secondary | ICD-10-CM | POA: Diagnosis not present

## 2016-06-06 DIAGNOSIS — E785 Hyperlipidemia, unspecified: Secondary | ICD-10-CM

## 2016-06-06 DIAGNOSIS — F325 Major depressive disorder, single episode, in full remission: Secondary | ICD-10-CM

## 2016-06-06 LAB — LIPID PANEL
Cholesterol: 195 mg/dL (ref 0–200)
HDL: 63.4 mg/dL (ref >39.00)
LDL Cholesterol: 104 mg/dL — ABNORMAL HIGH (ref 0–99)
NonHDL: 131.96
Total CHOL/HDL Ratio: 3
Triglycerides: 141 mg/dL (ref 0.0–149.0)
VLDL: 28.2 mg/dL (ref 0.0–40.0)

## 2016-06-06 LAB — BASIC METABOLIC PANEL
BUN: 12 mg/dL (ref 6–23)
CALCIUM: 9.6 mg/dL (ref 8.4–10.5)
CO2: 29 mEq/L (ref 19–32)
CREATININE: 0.75 mg/dL (ref 0.40–1.20)
Chloride: 100 mEq/L (ref 96–112)
GFR: 79.72 mL/min (ref >60.00)
GLUCOSE: 143 mg/dL — AB (ref 70–99)
Potassium: 4.2 mEq/L (ref 3.5–5.1)
SODIUM: 136 meq/L (ref 135–145)

## 2016-06-06 LAB — HEMOGLOBIN A1C: Hgb A1c MFr Bld: 6.9 % — ABNORMAL HIGH (ref 4.6–6.5)

## 2016-06-06 NOTE — Progress Notes (Signed)
Pre visit review using our clinic review tool, if applicable. No additional management support is needed unless otherwise documented below in the visit note. 

## 2016-06-06 NOTE — Patient Instructions (Signed)
BEFORE YOU LEAVE: -labs -follow up: 1) follow up in 3 months 2) Medicare exam in August with Manuela Schwartz -labs  We have ordered labs or studies at this visit. It can take up to 1-2 weeks for results and processing. IF results require follow up or explanation, we will call you with instructions. Clinically stable results will be released to your [facility]. If you have not heard from [place] or cannot find your results in [facility] in 2 weeks please contact our office at [phone].  If you are not yet signed up for [facility], please consider signing up.   We recommend the following healthy lifestyle for LIFE: 1) Small portions.   Tip: eat off of a salad plate instead of a dinner plate.  Tip: It is ok to feel hungry after a meal.  Tip: if you need more or a snack choose fruits, veggies and/or a handful of nuts or seeds.  2) Eat a healthy clean diet.  * Tip: Avoid (less then 1 serving per week): processed foods, sweets, sweetened drinks, white starches (rice, flour, bread, potatoes, pasta, etc), red meat, fast foods, butter  *Tip: CHOOSE instead   * 5-9 servings per day of fresh or frozen fruits and vegetables (but not corn, potatoes, bananas, canned or dried fruit)   *nuts and seeds, beans   *olives and olive oil   *small portions of lean meats such as fish and white chicken    *small portions of whole grains  3)Get at least 150 minutes of sweaty aerobic exercise per week.  4)Reduce stress - consider counseling, meditation and relaxation to balance other aspects of your life.  WE NOW OFFER   [facility] FAST TRACK!!!  SAME DAY Appointments for ACUTE CARE  Such as: Sprains, Injuries, cuts, abrasions, rashes, muscle pain, joint pain, back pain Colds, flu, sore throats, headache, allergies, cough, fever  Ear pain, sinus and eye infections Abdominal pain, nausea, vomiting, diarrhea, upset stomach Animal/insect bites  3 Easy Ways to Schedule: Walk-In Scheduling Call in  scheduling Mychart Sign-up: [email or web address]

## 2016-06-08 ENCOUNTER — Other Ambulatory Visit: Payer: Self-pay | Admitting: Family Medicine

## 2016-07-26 ENCOUNTER — Other Ambulatory Visit: Payer: Self-pay | Admitting: Family Medicine

## 2016-09-12 NOTE — Progress Notes (Signed)
HPI:  Gabrielle Robinson is a pleasant 76 y.o. here for follow up. Chronic medical problems summarized below were reviewed for changes and stability and were updated as needed below. These issues and their treatment remain stable for the most part. Only complaint is some fullness in L ear - thinks is wax - denies pain, drainage, hearing changes. Diabetes lab was higher last visit. Exercises. Diet could be better. Refuses recheck of hgba1c today as reports diet has been poor. Reports mood is great. Denies CP, SOB, DOE, fatigue, polyuria, treatment intolerance or new symptoms. Due for eye exam.  Depression: -chronic, initially presented to me 02/2014 -she opted for low dose lexapro and advised CBT  DM: -well controlled on last check -exercises for 30 minutes 3 days per week; diet ok -meds: metformin 500mg  bid  HLD: -increased pravastatin 10/2015 -denies: hx statin intol, leg cramps, cog changes    ROS: See pertinent positives and negatives per HPI.  Past Medical History:  Diagnosis Date  . Diabetes mellitus without complication ([place])   . Hyperlipemia 01/05/2009   Qualifier: Diagnosis of  By: Arnoldo Morale MD, Balinda Quails   . Major depressive disorder with single episode, in full remission ([place]) 05/28/2015    No past surgical history on file.  No family history on file.  Social History   Social History  . Marital status: Married    Spouse name: N/A  . Number of children: N/A  . Years of education: N/A   Social History Main Topics  . Smoking status: Never Smoker  . Smokeless tobacco: Never Used  . Alcohol use Yes     Comment: occ  . Drug use: No  . Sexual activity: Not Asked   Other Topics Concern  . 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  aspirin EC 81 MG tablet, Take 81 mg by mouth daily., Disp: , Rfl:  .  B Complex-C (SUPER B COMPLEX PO), Take by mouth., Disp: , Rfl:  .  Cyanocobalamin (VITAMIN B-12 CR PO), Take by mouth., Disp: , Rfl:  .  escitalopram (LEXAPRO) 5 MG tablet, TAKE 1 TABLET BY MOUTH DAILY., Disp: 90 tablet, Rfl: 1 .  metFORMIN (GLUCOPHAGE) 500 MG tablet, TAKE 1 TABLET (500 MG TOTAL) BY MOUTH 2 (TWO) TIMES DAILY WITH A MEAL., Disp: 180 tablet, Rfl: 3 .  pravastatin (PRAVACHOL) 40 MG tablet, TAKE 1 TABLET (40 MG TOTAL) BY MOUTH DAILY., Disp: 90 tablet, Rfl: 1  EXAM:  Vitals:   09/13/16 1109  BP: 132/74  Pulse: 70  Temp: 98.2 F (36.8 C)    Body mass index is 23.42 kg/m.  GENERAL: vitals reviewed and listed above, alert, oriented, appears well hydrated and in no acute distress  HEENT: atraumatic, conjunttiva clear, no obvious abnormalities on inspection of external nose and ears  NECK: no obvious masses on inspection  LUNGS: clear to auscultation bilaterally, no wheezes, rales or rhonchi, good air movement  CV: HRRR, no peripheral edema  MS: moves all extremities without noticeable abnormality  PSYCH: pleasant and cooperative, no obvious depression or anxiety  ASSESSMENT AND PLAN:  Discussed the following assessment and plan:  Type 2 diabetes mellitus without  complication, without long-term current use of insulin ([place]) -lifestyle recs -advised labs - she refused - agrees to do at AWV  Major depressive disorder with single episode, in full remission ([place]) -doing great  Hyperlipidemia, unspecified hyperlipidemia type -stable  Sensation of fullness in left ear -suspect ETD -INS x 3 weeks -RTC if persists advised  -Patient advised to return or notify a doctor immediately if symptoms worsen or persist or new concerns arise.  Patient Instructions  BEFORE YOU LEAVE: -follow up: 1) AWV with Manuela Schwartz in August after the 10th - on a day Dr. Maudie Mercury is in office (Mon, [place],  thurs or Fri); will plan to do labs that day 2) follow up with Dr. Maudie Mercury in 5 months  flonase 2 sprays each nostril daily for 1 month for the ear. Schedule follow up if symptoms persist.  Advise regular aerobic exercise (at least 150 minutes per week of sweaty exercise) and a healthy diet. Try to eat at least 5-9 servings of vegetables and fruits per day (not corn, potatoes or bananas.) Avoid sweets, red meat, pork, butter, fried foods, fast food, processed food, excessive dairy, eggs and coconut. Replace bad fats with good fats - fish, nuts and seeds, canola oil, olive oil.     Colin Benton R., DO

## 2016-09-13 ENCOUNTER — Ambulatory Visit (INDEPENDENT_AMBULATORY_CARE_PROVIDER_SITE_OTHER): Payer: Medicare Other | Admitting: Family Medicine

## 2016-09-13 ENCOUNTER — Encounter: Payer: Self-pay | Admitting: Family Medicine

## 2016-09-13 VITALS — BP 132/74 | HR 70 | Temp 98.2°F | Wt 121.9 lb

## 2016-09-13 DIAGNOSIS — H938X2 Other specified disorders of left ear: Secondary | ICD-10-CM | POA: Diagnosis not present

## 2016-09-13 DIAGNOSIS — E119 Type 2 diabetes mellitus without complications: Secondary | ICD-10-CM | POA: Diagnosis not present

## 2016-09-13 DIAGNOSIS — E785 Hyperlipidemia, unspecified: Secondary | ICD-10-CM | POA: Diagnosis not present

## 2016-09-13 DIAGNOSIS — F325 Major depressive disorder, single episode, in full remission: Secondary | ICD-10-CM | POA: Diagnosis not present

## 2016-09-13 NOTE — Patient Instructions (Signed)
BEFORE YOU LEAVE: -follow up: 1) AWV with Manuela Schwartz in August after the 10th - on a day Dr. Maudie Mercury is in office (Mon, [place], thurs or Fri); will plan to do labs that day 2) follow up with Dr. Maudie Mercury in 5 months  flonase 2 sprays each nostril daily for 1 month for the ear. Schedule follow up if symptoms persist.  Advise regular aerobic exercise (at least 150 minutes per week of sweaty exercise) and a healthy diet. Try to eat at least 5-9 servings of vegetables and fruits per day (not corn, potatoes or bananas.) Avoid sweets, red meat, pork, butter, fried foods, fast food, processed food, excessive dairy, eggs and coconut. Replace bad fats with good fats - fish, nuts and seeds, canola oil, olive oil.

## 2016-11-15 ENCOUNTER — Ambulatory Visit: Payer: Medicare Other

## 2016-12-18 ENCOUNTER — Other Ambulatory Visit: Payer: Self-pay | Admitting: Family Medicine

## 2017-02-05 NOTE — Progress Notes (Deleted)
HPI:  Gabrielle Robinson is a pleasant 76 y.o. here for follow up. Chronic medical problems summarized below were reviewed for changes and stability and were updated as needed below. These issues and their treatment remain stable for the most part. ***. Denies CP, SOB, DOE, treatment intolerance or new symptoms. Due for labs, eye exam, flu vaccine, urine micro/alb and AWV Depression: -chronic, initially presented to me 02/2014 -she opted for low dose lexapro and advised CBT  DM: -well controlled on last check -exercises for 30 minutes 3 days per week; diet ok -meds: metformin 500mg  bid  HLD: -increased pravastatin 10/2015 -denies: hx statin intol, leg cramps, cog changes     ROS: See pertinent positives and negatives per HPI.  Past Medical History:  Diagnosis Date  . Diabetes mellitus without complication ([place])   . Hyperlipemia 01/05/2009   Qualifier: Diagnosis of  By: Arnoldo Morale MD, Balinda Quails   . Major depressive disorder with single episode, in full remission ([place]) 05/28/2015    No past surgical history on file.  No family history on file.  Social History   Socioeconomic History  . Marital status: Married    Spouse name: Not on file  . Number of children: Not on file  . Years of education: Not on file  . Highest education level: Not on file  Social Needs  . Financial resource strain: Not on file  . Food insecurity - worry: Not on file  . Food insecurity - inability: Not on file  . Transportation needs - medical: Not on file  . Transportation needs - non-medical: Not on file  Occupational History  . Not on file  Tobacco Use  . Smoking status: Never Smoker  . Smokeless tobacco: Never Used  Substance and Sexual Activity  . Alcohol use: Yes    Comment: occ  . Drug use: No  . Sexual activity: Not on file  Other Topics Concern  . 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  aspirin EC 81 MG tablet, Take 81 mg by mouth daily., Disp: , Rfl:  .  B Complex-C (SUPER B COMPLEX PO), Take by mouth., Disp: , Rfl:  .  Cyanocobalamin (VITAMIN B-12 CR PO), Take by mouth., Disp: , Rfl:  .  escitalopram (LEXAPRO) 5 MG tablet, TAKE 1 TABLET BY MOUTH DAILY., Disp: 90 tablet, Rfl: 1 .  metFORMIN (GLUCOPHAGE) 500 MG tablet, TAKE 1 TABLET BY MOUTH TWICE DAILY WITH MEALS, Disp: 180 tablet, Rfl: 1 .  pravastatin (PRAVACHOL) 40 MG tablet, TAKE 1 TABLET BY MOUTH ONCE A DAY , Disp: 90 tablet, Rfl: 1  EXAM:  There were no vitals filed for this visit.  There is no height or weight on file to calculate BMI.  GENERAL: vitals reviewed and listed above, alert, oriented, appears well hydrated and in no acute distress  HEENT: atraumatic, conjunttiva clear, no obvious abnormalities on inspection of external nose and ears  NECK: no obvious masses on inspection  LUNGS: clear to auscultation bilaterally, no wheezes, rales or rhonchi, good air movement  CV: HRRR, no peripheral edema  MS: moves all extremities without noticeable abnormality *** PSYCH: pleasant and cooperative, no obvious depression or  anxiety  ASSESSMENT AND PLAN:  Discussed the following assessment and plan:  No diagnosis found.  *** -Patient advised to return or notify a doctor immediately if symptoms worsen or persist or new concerns arise.  There are no Patient Instructions on file for this visit.  Colin Benton R., DO

## 2017-02-06 ENCOUNTER — Ambulatory Visit: Payer: Medicare Other | Admitting: Family Medicine

## 2017-02-12 ENCOUNTER — Other Ambulatory Visit: Payer: Self-pay | Admitting: Family Medicine

## 2017-03-27 ENCOUNTER — Other Ambulatory Visit: Payer: Self-pay | Admitting: Family Medicine

## 2017-07-01 ENCOUNTER — Other Ambulatory Visit: Payer: Self-pay | Admitting: Family Medicine

## 2017-07-03 ENCOUNTER — Other Ambulatory Visit: Payer: Self-pay | Admitting: *Deleted

## 2017-07-03 MED ORDER — METFORMIN HCL 500 MG PO TABS: ORAL_TABLET | ORAL | 0 refills | Status: DC

## 2017-07-03 NOTE — Telephone Encounter (Signed)
Rx done. 

## 2017-07-06 ENCOUNTER — Other Ambulatory Visit: Payer: Self-pay | Admitting: Family Medicine

## 2017-07-19 NOTE — Progress Notes (Signed)
HPI:  Using dictation device. Unfortunately this device frequently misinterprets words/phrases. Due for AWV/CPE, labs, eye exam, urine micro, foot exam Agrees to schedule eye exam  Gabrielle Robinson is a pleasant 77 y.o. here for follow up. Chronic medical problems summarized below were reviewed for changes. Reports doing great. Reports mood great. Wishes to continue lexapro and her other medications. No regular exercise. Tries to eat well.Denies CP, SOB, DOE, treatment intolerance or new symptoms.   Depression: -chronic, initially presented to me 02/2014 -she opted for low dose lexapro and advised CBT  DM: -well controlled on last check -meds: metformin 500mg  bid  HLD: -increased pravastatin 10/2015 -denies: hx statin intol, leg cramps, cog changes   ROS: See pertinent positives and negatives per HPI.  Past Medical History:  Diagnosis Date  . Diabetes mellitus without complication ([place])   . Hyperlipemia 01/05/2009   Qualifier: Diagnosis of  By: Arnoldo Morale MD, Balinda Quails   . Major depressive disorder with single episode, in full remission ([place]) 05/28/2015    History reviewed. No pertinent surgical history.  History reviewed. No pertinent family history.  SOCIAL HX: see hpi   Current Outpatient Medications:  .  aspirin EC 81 MG tablet, Take 81 mg by mouth daily., Disp: , Rfl:  .  B Complex-C (SUPER B COMPLEX PO), Take by mouth., Disp: , Rfl:  .  Cyanocobalamin (VITAMIN B-12 CR PO), Take by mouth., Disp: , Rfl:  .  escitalopram (LEXAPRO) 5 MG tablet, take 1 tablet by mouth daily, Disp: 30 tablet, Rfl: 0 .  metFORMIN (GLUCOPHAGE) 500 MG tablet, TAKE 1 TABLET BY MOUTH TWICE A DAY WITH MEALS, Disp: 60 tablet, Rfl: 0 .  pravastatin (PRAVACHOL) 40 MG tablet, TAKE ONE TABLET BY MOUTH ONE TIME DAILY , Disp: 30 tablet, Rfl: 0  EXAM:  Vitals:   07/20/17 1052  BP: 130/70  Pulse: 88  Temp: 98 F (36.7 C)    Body mass index is 22.47 kg/m.  GENERAL: vitals reviewed and listed  above, alert, oriented, appears well hydrated and in no acute distress  HEENT: atraumatic, conjunttiva clear, no obvious abnormalities on inspection of external nose and ears  NECK: no obvious masses on inspection  LUNGS: clear to auscultation bilaterally, no wheezes, rales or rhonchi, good air movement  CV: HRRR, no peripheral edema  MS: moves all extremities without noticeable abnormality  PSYCH: pleasant and cooperative, no obvious depression or anxiety  Foot exam done and normal  ASSESSMENT AND PLAN:  Discussed the following assessment and plan:  Type 2 diabetes mellitus without complication, without long-term current use of insulin (HCC) - Plan: Basic metabolic panel, Hemoglobin A1c, Microalbumin / creatinine urine ratio  Hyperlipidemia associated with type 2 diabetes mellitus ([place]) - Plan: Lipid panel  Recurrent major depressive disorder, in full remission ([place])  -labs -she agrees to schedule eye exam -foot exam done -lifestyle recs -AWV/CPE next visit   Patient Instructions  BEFORE YOU LEAVE: -labs -follow up: AWV with Susan/CPE Dr. Maudie Mercury 4 months  Call your eye doctor today to schedule your diabetic eye exam  We have ordered labs or studies at this visit. It can take up to 1-2 weeks for results and processing. IF results require follow up or explanation, we will call you with instructions. Clinically stable results will be released to your [facility]. If you have not heard from [place] or cannot find your results in [facility] in 2 weeks please contact our office at [phone].  If you are not yet signed up for  MYCHART, please consider signing up.    We recommend the following healthy lifestyle for LIFE: 1) Small portions. But, make sure to get regular (at least 3 per day), healthy meals and small healthy snacks if needed.  2) Eat a healthy clean diet.   TRY TO EAT: -at least 5-7 servings of low sugar, colorful, and nutrient rich vegetables per day (not corn, potatoes  or bananas.) -berries are the best choice if you wish to eat fruit (only eat small amounts if trying to reduce weight)  -lean meets (fish, white meat of chicken or [place]) -vegan proteins for some meals - beans or tofu, whole grains, nuts and seeds -Replace bad fats with good fats - good fats include: fish, nuts and seeds, canola oil, olive oil -small amounts of low fat or non fat dairy -small amounts of100 % whole grains - check the lables -drink plenty of water  AVOID: -SUGAR, sweets, anything with added sugar, corn syrup or sweeteners - must read labels as even foods advertised as "healthy" often are loaded with sugar -if you must have a sweetener, small amounts of stevia may be best -sweetened beverages and artificially sweetened beverages -simple starches (rice, bread, potatoes, pasta, chips, etc - small amounts of 100% whole grains are ok) -red meat, pork, butter -fried foods, fast food, processed food, excessive dairy, eggs and coconut.  3)Get at least 150 minutes of sweaty aerobic exercise per week.  4)Reduce stress - consider counseling, meditation and relaxation to balance other aspects of your life.        Lucretia Kern, DO

## 2017-07-20 ENCOUNTER — Ambulatory Visit: Payer: Medicare Other | Admitting: Family Medicine

## 2017-07-20 ENCOUNTER — Encounter: Payer: Self-pay | Admitting: Family Medicine

## 2017-07-20 VITALS — BP 130/70 | HR 88 | Temp 98.0°F | Ht 60.5 in | Wt 117.0 lb

## 2017-07-20 DIAGNOSIS — F3342 Major depressive disorder, recurrent, in full remission: Secondary | ICD-10-CM

## 2017-07-20 DIAGNOSIS — E785 Hyperlipidemia, unspecified: Secondary | ICD-10-CM | POA: Diagnosis not present

## 2017-07-20 DIAGNOSIS — E119 Type 2 diabetes mellitus without complications: Secondary | ICD-10-CM | POA: Diagnosis not present

## 2017-07-20 DIAGNOSIS — E1169 Type 2 diabetes mellitus with other specified complication: Secondary | ICD-10-CM

## 2017-07-20 LAB — LIPID PANEL
CHOL/HDL RATIO: 3
Cholesterol: 189 mg/dL (ref 0–200)
HDL: 75.2 mg/dL (ref >39.00)
LDL Cholesterol: 97 mg/dL (ref 0–99)
NONHDL: 113.95
Triglycerides: 86 mg/dL (ref 0.0–149.0)
VLDL: 17.2 mg/dL (ref 0.0–40.0)

## 2017-07-20 LAB — BASIC METABOLIC PANEL
BUN: 13 mg/dL (ref 6–23)
CO2: 29 mEq/L (ref 19–32)
CREATININE: 0.81 mg/dL (ref 0.40–1.20)
Calcium: 9.7 mg/dL (ref 8.4–10.5)
Chloride: 102 mEq/L (ref 96–112)
GFR: 72.73 mL/min (ref >60.00)
GLUCOSE: 135 mg/dL — AB (ref 70–99)
POTASSIUM: 5 meq/L (ref 3.5–5.1)
Sodium: 139 mEq/L (ref 135–145)

## 2017-07-20 LAB — HEMOGLOBIN A1C: Hgb A1c MFr Bld: 6.8 % — ABNORMAL HIGH (ref 4.6–6.5)

## 2017-07-20 LAB — MICROALBUMIN / CREATININE URINE RATIO
Creatinine,U: 217.4 mg/dL
Microalb Creat Ratio: 0.8 mg/g (ref 0.0–30.0)
Microalb, Ur: 1.8 mg/dL (ref 0.0–1.9)

## 2017-07-20 NOTE — Patient Instructions (Addendum)
BEFORE YOU LEAVE: -labs -follow up: AWV with Susan/CPE Dr. Maudie Mercury 4 months  Call your eye doctor today to schedule your diabetic eye exam  We have ordered labs or studies at this visit. It can take up to 1-2 weeks for results and processing. IF results require follow up or explanation, we will call you with instructions. Clinically stable results will be released to your [facility]. If you have not heard from [place] or cannot find your results in [facility] in 2 weeks please contact our office at [phone].  If you are not yet signed up for [facility], please consider signing up.    We recommend the following healthy lifestyle for LIFE: 1) Small portions. But, make sure to get regular (at least 3 per day), healthy meals and small healthy snacks if needed.  2) Eat a healthy clean diet.   TRY TO EAT: -at least 5-7 servings of low sugar, colorful, and nutrient rich vegetables per day (not corn, potatoes or bananas.) -berries are the best choice if you wish to eat fruit (only eat small amounts if trying to reduce weight)  -lean meets (fish, white meat of chicken or [place]) -vegan proteins for some meals - beans or tofu, whole grains, nuts and seeds -Replace bad fats with good fats - good fats include: fish, nuts and seeds, canola oil, olive oil -small amounts of low fat or non fat dairy -small amounts of100 % whole grains - check the lables -drink plenty of water  AVOID: -SUGAR, sweets, anything with added sugar, corn syrup or sweeteners - must read labels as even foods advertised as "healthy" often are loaded with sugar -if you must have a sweetener, small amounts of stevia may be best -sweetened beverages and artificially sweetened beverages -simple starches (rice, bread, potatoes, pasta, chips, etc - small amounts of 100% whole grains are ok) -red meat, pork, butter -fried foods, fast food, processed food, excessive dairy, eggs and coconut.  3)Get at least 150 minutes of sweaty aerobic exercise  per week.  4)Reduce stress - consider counseling, meditation and relaxation to balance other aspects of your life.

## 2017-08-01 ENCOUNTER — Other Ambulatory Visit: Payer: Self-pay | Admitting: Family Medicine

## 2017-08-15 ENCOUNTER — Telehealth: Payer: Self-pay | Admitting: Family Medicine

## 2017-08-15 MED ORDER — PRAVASTATIN SODIUM 40 MG PO TABS: ORAL_TABLET | ORAL | 1 refills | Status: DC

## 2017-08-15 MED ORDER — METFORMIN HCL 500 MG PO TABS: ORAL_TABLET | ORAL | 1 refills | Status: DC

## 2017-08-15 MED ORDER — ESCITALOPRAM OXALATE 5 MG PO TABS: ORAL_TABLET | ORAL | 1 refills | Status: DC

## 2017-08-15 NOTE — Telephone Encounter (Signed)
Copied from [place] [phone]. Topic: Quick Communication - See Telephone Encounter >> Aug 15, 2017 12:15 PM Rutherford Nail, NT wrote: CRM for notification. See Telephone encounter for: 08/15/17. Patient states that she spoke with Denice Paradise and she was supposed to make her prescriptions to 90 day supplies. Patient states that her medications are still being sent as a 30 day supply. Please advise. CB#: [phone]

## 2017-08-15 NOTE — Telephone Encounter (Signed)
90 day refills sent to [facility] on Metformin, Pravastatin and Escitalopram.  I left a message for the pt to return my call and CRM also created.

## 2017-10-16 ENCOUNTER — Encounter: Payer: Self-pay | Admitting: Family Medicine

## 2017-10-16 ENCOUNTER — Ambulatory Visit: Payer: Medicare Other | Admitting: Family Medicine

## 2017-10-16 VITALS — BP 122/74 | HR 71 | Temp 98.4°F | Ht 60.0 in | Wt 119.0 lb

## 2017-10-16 DIAGNOSIS — H60502 Unspecified acute noninfective otitis externa, left ear: Secondary | ICD-10-CM

## 2017-10-16 MED ORDER — CIPROFLOXACIN-DEXAMETHASONE 0.3-0.1 % OT SUSP: 4.0000 [drp] | Freq: Two times a day (BID) | OTIC | 0 refills | Status: DC

## 2017-10-16 NOTE — Progress Notes (Signed)
   Subjective:  Gabrielle Robinson is a 77 y.o. female who presents today for same-day appointment with a chief complaint of ear pain.   HPI:  Ear Pain, Acute problem Started 3 days ago. Worsened over that time. Located in left ear. Some swelling to the area.  She has taken some aspirin which is helped with her pain.  No fevers or chills.  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  No other obvious alleviating or aggravating factors.  ROS: Per HPI  PMH: She reports that she has never smoked. She has never used smokeless tobacco. She reports that she drinks alcohol. She reports that she does not use drugs.  Objective:  Physical Exam: BP 122/74 (BP Location: Left Arm, Patient Position: Sitting, Cuff Size: Normal)   Pulse 71   Temp 98.4 F (36.9 C) (Oral)   Ht 5' (1.524 m)   Wt 119 lb (54 kg)   SpO2 95%   BMI 23.24 kg/m   Gen: NAD, resting comfortably HEENT: Right EAC clear.  Left EAC erythematous with small amount of purulent discharge.  Assessment/Plan:  Otitis externa No red flag signs or symptoms.  Start Ciprodex drops.  Discussed reasons to return to care.  Discussed importance of avoidance of water and moisture in her ear canal.  Follow-up as needed.  Algis Greenhouse. Jerline Pain, MD 10/16/2017 2:20 PM

## 2017-10-16 NOTE — Patient Instructions (Addendum)
It was very nice to see you today!  Please start the drops. Let me or your primary care doctor know if your symptoms worsen or do not improve over the next several days.   Take care, Dr Jerline Pain   Otitis Externa Otitis externa is an infection of the outer ear canal. The outer ear canal is the area between the outside of the ear and the eardrum. Otitis externa is sometimes called "swimmer's ear." Follow these instructions at home:  If you were given antibiotic ear drops, use them as told by your doctor. Do not stop using them even if your condition gets better.  Take over-the-counter and prescription medicines only as told by your doctor.  Keep all follow-up visits as told by your doctor. This is important. How is this prevented?  Keep your ear dry. Use the corner of a towel to dry your ear after you swim or bathe.  Try not to scratch or put things in your ear. Doing these things makes it easier for germs to grow in your ear.  Avoid swimming in lakes, dirty water, or pools that may not have the right amount of a chemical called chlorine.  Consider making ear drops and putting 3 or 4 drops in each ear after you swim. Ask your doctor about how you can make ear drops. Contact a doctor if:  You have a fever.  After 3 days your ear is still red, swollen, or painful.  After 3 days you still have pus coming from your ear.  Your redness, swelling, or pain gets worse.  You have a really bad headache.  You have redness, swelling, pain, or tenderness behind your ear. This information is not intended to replace advice given to you by your health care provider. Make sure you discuss any questions you have with your health care provider. Document Released: 08/31/2007 Document Revised: 04/09/2015 Document Reviewed: 12/22/2014 Elsevier Interactive Patient Education  [employer].

## 2017-11-21 ENCOUNTER — Ambulatory Visit: Payer: Medicare Other

## 2017-11-21 ENCOUNTER — Encounter: Payer: Medicare Other | Admitting: Family Medicine

## 2017-12-04 NOTE — Progress Notes (Signed)
Subjective:   Gabrielle Robinson is a 77 y.o. female who presents for Medicare Annual (Subsequent) preventive examination.   Reports health as very good See's Dr. 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 INFLUENZA VACCINE  10/26/2017  . TETANUS/TDAP  10/28/2017   Diabetic eye exam  06/2014  Colonoscopy 01/2009  Mammogram 05/2014   Educated regarding shingrix   Cardiac Risk Factors include: advanced age (>63men, >78 women);diabetes mellitus;dyslipidemia     Objective:     Vitals: BP 112/68   Pulse 79   Ht 5' (1.524 m)   Wt 120 lb (54.4 kg)   BMI 23.44 kg/m   Body mass index is 23.44 kg/m.  Advanced Directives 12/05/2017 12/05/2017 09/17/2014  Does Patient Have a Medical Advance Directive? No No No  Would patient like information on creating a medical advance directive? - - Yes - Educational materials given   Given [place] forms to review and complete   Tobacco Social History   Tobacco Use  Smoking Status Never Smoker  Smokeless Tobacco Never Used     Counseling given: Yes   Clinical Intake:     Past Medical History:  Diagnosis Date  . Diabetes mellitus without complication ([place])   . Hyperlipemia 01/05/2009   Qualifier: Diagnosis of  By: Arnoldo Morale MD, Balinda Quails   . Major depressive disorder with single episode, in full remission ([place]) 05/28/2015   No past surgical history on file. No family history on file. Social History   Socioeconomic History  . Marital status: Married    Spouse name: Not on file  . Number of children: Not on file  . Years of education: Not on file  . Highest education level: Not on file  Occupational History  . Not on file  Social Needs  . Financial  resource strain: Not on file  . Food insecurity:    Worry: Not on file    Inability: Not on file  . Transportation needs:    Medical: Not on file    Non-medical: Not on file  Tobacco Use  . Smoking status: Never Smoker  . Smokeless tobacco: Never Used  Substance and Sexual Activity  . Alcohol use: Yes    Comment: occ  . Drug use: No  . Sexual activity: Not on file  Lifestyle  . Physical activity:    Days per week: Not on file    Minutes per session: Not on file  . Stress: Not on file  Relationships  . Social connections:    Talks on phone: Not on file    Gets together: Not on file    Attends religious service: Not on file    Active member of club or organization: Not on file    Attends meetings of clubs or organizations: Not on file    Relationship status: Not on file  Other Topics Concern  . 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 B Complex-C (SUPER B COMPLEX PO) Take by mouth.  . Cyanocobalamin (VITAMIN B-12 CR PO) Take by mouth.  . escitalopram (LEXAPRO) 5 MG tablet Take 1 tablet by mouth once daily  . metFORMIN (GLUCOPHAGE) 500 MG tablet TAKE ONE TABLET BY MOUTH TWICE DAILY WITH MEALS  . pravastatin (PRAVACHOL) 40 MG tablet take 1 tablet by mouth once daily  . [DISCONTINUED] aspirin EC 81 MG tablet Take 81 mg by mouth daily.  . [DISCONTINUED] ciprofloxacin-dexamethasone (CIPRODEX) OTIC suspension Place 4 drops into the right ear 2 (two) times daily.   No facility-administered encounter medications on file as of 12/05/2017.     Activities of Daily Living In your present state of health, do you have any difficulty performing the following activities: 12/05/2017  Hearing? Y  Vision? N   Difficulty concentrating or making decisions? N  Walking or climbing stairs? N  Dressing or bathing? N  Doing errands, shopping? N  Preparing Food and eating ? N  Using the Toilet? N  In the past six months, have you accidently leaked urine? N  Do you have problems with loss of bowel control? N  Managing your Medications? N  Managing your Finances? N  Housekeeping or managing your Housekeeping? N  Some recent data might be hidden    Patient Care Team: Lucretia Kern, DO as PCP - General (Family Medicine)    Assessment:   This is a routine wellness examination for Gabrielle Robinson.  Exercise Activities and Dietary recommendations Current Exercise Habits: Home exercise routine(the patient states she stays very busy but agrees to try to find a walking group )  Goals    . Exercise 150 min/wk Moderate Activity     Call parks and recreation the women's walking group  Or join club        Fall Risk Fall Risk  12/05/2017 12/05/2017 11/02/2015 09/17/2014 09/17/2014  Falls in the past year? No No No Yes No  Number falls in past yr: - - - 1 -  Injury with Fall? - - - No -  Risk for fall due to : - - - - -  Follow up - - - Education provided -     Depression Screen PHQ 2/9 Scores 12/05/2017 07/20/2017 11/02/2015 09/17/2014  PHQ - 2 Score 0 0 0 0  PHQ- 9 Score - 2 - -     Cognitive Function   Ad8 score reviewed for issues:  Issues making decisions:  Less interest in hobbies / activities:  Repeats questions, stories (family complaining):  Trouble using ordinary gadgets (microwave, computer, phone):  Forgets the month or year:   Mismanaging finances:   Remembering appts:  Daily problems with thinking and/or memory: Ad8 score is=0          Immunization History  Administered Date(s) Administered  . Influenza Split 12/26/2012  . Influenza Whole 01/09/2008, 01/01/2010  . Influenza, High Dose Seasonal PF 03/10/2014, 12/29/2014, 03/07/2016, 12/05/2017  . Pneumococcal Conjugate-13  09/17/2014  . Pneumococcal Polysaccharide-23 01/01/2010  . Td 10/29/2007, 12/05/2017      Screening Tests Health Maintenance  Topic Date Due  . INFLUENZA VACCINE  10/26/2017  . TETANUS/TDAP  10/28/2017  . OPHTHALMOLOGY EXAM  03/06/2018 (Originally 07/25/2015)  . HEMOGLOBIN A1C  01/19/2018  . FOOT EXAM  07/21/2018  . URINE MICROALBUMIN  07/21/2018  . DEXA SCAN  Completed  . PNA  vac Low Risk Adult  Completed         Plan:      PCP Notes   Health Maintenance  Rec'd tetanus and flu vaccine today   Diabetic eye exam  06/2014 - will schedule eye exam May change providers   Colonoscopy 01/2009 - aged out  Normal exam 2010   Mammogram 05/2014 - will schedule one  Bone density - does not want to repeat Given the osteoporosis foundation web site to learn more   Educated regarding shingrix   Abnormal Screens  none  Referrals  none  Patient concerns; None Does agree to try to find a walking group  Nurse Concerns; As noted  Next PCP apt Seeing Dr. Maudie Mercury today       I have personally reviewed and noted the following in the patient's chart:   . Medical and social history . Use of alcohol, tobacco or illicit drugs  . Current medications and supplements . Functional ability and status . Nutritional status . Physical activity . Advanced directives . List of other physicians . Hospitalizations, surgeries, and ER visits in previous 12 months . Vitals . Screenings to include cognitive, depression, and falls . Referrals and appointments  In addition, I have reviewed and discussed with patient certain preventive protocols, quality metrics, and best practice recommendations. A written personalized care plan for preventive services as well as general preventive health recommendations were provided to patient.     Wynetta Fines, RN  12/05/2017

## 2017-12-05 ENCOUNTER — Encounter: Payer: Self-pay | Admitting: Family Medicine

## 2017-12-05 ENCOUNTER — Ambulatory Visit (INDEPENDENT_AMBULATORY_CARE_PROVIDER_SITE_OTHER): Payer: Medicare Other

## 2017-12-05 ENCOUNTER — Ambulatory Visit (INDEPENDENT_AMBULATORY_CARE_PROVIDER_SITE_OTHER): Payer: Medicare Other | Admitting: Family Medicine

## 2017-12-05 VITALS — BP 112/68 | HR 79 | Temp 98.7°F | Ht 60.25 in | Wt 120.0 lb

## 2017-12-05 VITALS — BP 112/68 | HR 79 | Ht 60.0 in | Wt 120.0 lb

## 2017-12-05 DIAGNOSIS — Z0001 Encounter for general adult medical examination with abnormal findings: Secondary | ICD-10-CM

## 2017-12-05 DIAGNOSIS — R030 Elevated blood-pressure reading, without diagnosis of hypertension: Secondary | ICD-10-CM | POA: Diagnosis not present

## 2017-12-05 DIAGNOSIS — E119 Type 2 diabetes mellitus without complications: Secondary | ICD-10-CM | POA: Diagnosis not present

## 2017-12-05 DIAGNOSIS — L989 Disorder of the skin and subcutaneous tissue, unspecified: Secondary | ICD-10-CM

## 2017-12-05 DIAGNOSIS — E1169 Type 2 diabetes mellitus with other specified complication: Secondary | ICD-10-CM

## 2017-12-05 DIAGNOSIS — F339 Major depressive disorder, recurrent, unspecified: Secondary | ICD-10-CM

## 2017-12-05 DIAGNOSIS — Z23 Encounter for immunization: Secondary | ICD-10-CM | POA: Diagnosis not present

## 2017-12-05 DIAGNOSIS — Z1331 Encounter for screening for depression: Secondary | ICD-10-CM

## 2017-12-05 DIAGNOSIS — E785 Hyperlipidemia, unspecified: Secondary | ICD-10-CM

## 2017-12-05 DIAGNOSIS — Z0000 Encounter for general adult medical examination without abnormal findings: Secondary | ICD-10-CM | POA: Diagnosis not present

## 2017-12-05 NOTE — Patient Instructions (Addendum)
Gabrielle Robinson , Thank you for taking time to come for your Medicare Wellness Visit. I appreciate your ongoing commitment to your health goals. Please review the following plan we discussed and let me know if I can assist you in the future.   Please make an apt with Dr. Juanito Doom or other for dilated eye exam  Got her tetanus today and flu vaccine today   Will have another mammogram to schedule   Keep an eye on skin changes; Dr. Maudie Mercury is referring to a dermatologist   Shingrix is a vaccine for the prevention of Shingles in Adults 31 and older.  If you are on Medicare, the shingrix is covered under your Part D plan, so you will take both of the vaccines in the series at your pharmacy. Please check with your benefits regarding applicable copays or out of pocket expenses.  The Shingrix is given in 2 vaccines approx 8 weeks apart. You must receive the 2nd dose prior to 6 months from receipt of the first. Please have the pharmacist print out you Immunization  dates for our office records    Recommendations for Dexa Scan Female over the age of 26 Man age 29 or older If you broke a bone past the age of 52 Women menopausal age with risk factors (thin frame; smoker; hx of fx ) Post menopausal women under the age of 67 with risk factors A man age 87 to 51 with risk factors Other: Spine xray that is showing break of bone loss Back pain with possible break Height loss of 1/2 inch or more within one year Total loss in height of 1.5 inches from your original height  Calcium 1284m with Vit D 800u per day; more as directed by physician Strength building exercises discussed; can include walking; housework; small weights or stretch bands; silver sneakers if access to the [facility]  Please visit the osteoporosis foundation.org for up to date recommendations   These are the goals we discussed: Goals    . Exercise 150 min/wk Moderate Activity     Call parks [employer] recreation the women's walking group  Or  join club        This is a list of the screening recommended for you and due dates:  Health Maintenance  Topic Date Due  . Flu Shot  10/26/2017  . Tetanus Vaccine  10/28/2017  . Eye exam for diabetics  03/06/2018*  . Hemoglobin A1C  01/19/2018  . Complete foot exam   07/21/2018  . Urine Protein Check  07/21/2018  . DEXA scan (bone density measurement)  Completed  . Pneumonia vaccines  Completed  *Topic was postponed. The date shown is not the original due date.      Fall Prevention in the Home Falls can cause injuries. They can happen to people of all ages. There are many things you can do to make your home safe and to help prevent falls. What can I do on the outside of my home?  Regularly fix the edges of walkways and driveways and fix any cracks.  Remove anything that might make you trip as you walk through a door, such as a raised step or threshold.  Trim any bushes or trees on the path to your home.  Use bright outdoor lighting.  Clear any walking paths of anything that might make someone trip, such as rocks or tools.  Regularly check to see if handrails are loose or broken. Make sure that both sides of any steps have  handrails.  Any raised decks and porches should have guardrails on the edges.  Have any leaves, snow, or ice cleared regularly.  Use sand or salt on walking paths during winter.  Clean up any spills in your garage right away. This includes oil or grease spills. What can I do in the bathroom?  Use night lights.  Install grab bars by the toilet and in the tub and shower. Do not use towel bars as grab bars.  Use non-skid mats or decals in the tub or shower.  If you need to sit down in the shower, use a plastic, non-slip stool.  Keep the floor dry. Clean up any water that spills on the floor as soon as it happens.  Remove soap buildup in the tub or shower regularly.  Attach bath mats securely with double-sided non-slip rug tape.  Do not have  throw rugs and other things on the floor that can make you trip. What can I do in the bedroom?  Use night lights.  Make sure that you have a light by your bed that is easy to reach.  Do not use any sheets or blankets that are too big for your bed. They should not hang down onto the floor.  Have a firm chair that has side arms. You can use this for support while you get dressed.  Do not have throw rugs and other things on the floor that can make you trip. What can I do in the kitchen?  Clean up any spills right away.  Avoid walking on wet floors.  Keep items that you use a lot in easy-to-reach places.  If you need to reach something above you, use a strong step stool that has a grab bar.  Keep electrical cords out of the way.  Do not use floor polish or wax that makes floors slippery. If you must use wax, use non-skid floor wax.  Do not have throw rugs and other things on the floor that can make you trip. What can I do with my stairs?  Do not leave any items on the stairs.  Make sure that there are handrails on both sides of the stairs and use them. Fix handrails that are broken or loose. Make sure that handrails are as long as the stairways.  Check any carpeting to make sure that it is firmly attached to the stairs. Fix any carpet that is loose or worn.  Avoid having throw rugs at the top or bottom of the stairs. If you do have throw rugs, attach them to the floor with carpet tape.  Make sure that you have a light switch at the top of the stairs and the bottom of the stairs. If you do not have them, ask someone to add them for you. What else can I do to help prevent falls?  Wear shoes that: ? Do not have high heels. ? Have rubber bottoms. ? Are comfortable and fit you well. ? Are closed at the toe. Do not wear sandals.  If you use a stepladder: ? Make sure that it is fully opened. Do not climb a closed stepladder. ? Make sure that both sides of the stepladder are  locked into place. ? Ask someone to hold it for you, if possible.  Clearly mark and make sure that you can see: ? Any grab bars or handrails. ? First and last steps. ? Where the edge of each step is.  Use tools that help you move around (mobility  aids) if they are needed. These include: ? Canes. ? Walkers. ? Scooters. ? Crutches.  Turn on the lights when you go into a dark area. Replace any light bulbs as soon as they burn out.  Set up your furniture so you have a clear path. Avoid moving your furniture around.  If any of your floors are uneven, fix them.  If there are any pets around you, be aware of where they are.  Review your medicines with your doctor. Some medicines can make you feel dizzy. This can increase your chance of falling. Ask your doctor what other things that you can do to help prevent falls. This information is not intended to replace advice given to you by your health care provider. Make sure you discuss any questions you have with your health care provider. Document Released: 01/08/2009 Document Revised: 08/20/2015 Document Reviewed: 04/18/2014 Elsevier Interactive Patient Education  2018 [place] Maintenance, Female Adopting a healthy lifestyle and getting preventive care can go a long way to promote health and wellness. Talk with your health care provider about what schedule of regular examinations is right for you. This is a good chance for you to check in with your provider about disease prevention and staying healthy. In between checkups, there are plenty of things you can do on your own. Experts have done a lot of research about which lifestyle changes and preventive measures are most likely to keep you healthy. Ask your health care provider for more information. Weight and diet Eat a healthy diet  Be sure to include plenty of vegetables, fruits, low-fat dairy products, and lean protein.  Do not eat a lot of foods high in solid fats, added  sugars, or salt.  Get regular exercise. This is one of the most important things you can do for your health. ? Most adults should exercise for at least 150 minutes each week. The exercise should increase your heart rate and make you sweat (moderate-intensity exercise). ? Most adults should also do strengthening exercises at least twice a week. This is in addition to the moderate-intensity exercise.  Maintain a healthy weight  Body mass index (BMI) is a measurement that can be used to identify possible weight problems. It estimates body fat based on height and weight. Your health care provider can help determine your BMI and help you achieve or maintain a healthy weight.  For females 83 years of age and older: ? A BMI below 18.5 is considered underweight. ? A BMI of 18.5 to 24.9 is normal. ? A BMI of 25 to 29.9 is considered overweight. ? A BMI of 30 and above is considered obese.  Watch levels of cholesterol and blood lipids  You should start having your blood tested for lipids and cholesterol at 78 years of age, then have this test every 5 years.  You may need to have your cholesterol levels checked more often if: ? Your lipid or cholesterol levels are high. ? You are older than 78 years of age. ? You are at high risk for heart disease.  Cancer screening Lung Cancer  Lung cancer screening is recommended for adults 60-91 years old who are at high risk for lung cancer because of a history of smoking.  A yearly low-dose CT scan of the lungs is recommended for people who: ? Currently smoke. ? Have quit within the past 15 years. ? Have at least a 30-pack-year history of smoking. A pack year is smoking an average of one  pack of cigarettes a day for 1 year.  Yearly screening should continue until it has been 15 years since you quit.  Yearly screening should stop if you develop a health problem that would prevent you from having lung cancer treatment.  Breast Cancer  Practice breast  self-awareness. This means understanding how your breasts normally appear and feel.  It also means doing regular breast self-exams. Let your health care provider know about any changes, no matter how small.  If you are in your 20s or 30s, you should have a clinical breast exam (CBE) by a health care provider every 1-3 years as part of a regular health exam.  If you are 71 or older, have a CBE every year. Also consider having a breast X-ray (mammogram) every year.  If you have a family history of breast cancer, talk to your health care provider about genetic screening.  If you are at high risk for breast cancer, talk to your health care provider about having an MRI and a mammogram every year.  Breast cancer gene (BRCA) assessment is recommended for women who have family members with BRCA-related cancers. BRCA-related cancers include: ? Breast. ? Ovarian. ? Tubal. ? Peritoneal cancers.  Results of the assessment will determine the need for genetic counseling and BRCA1 and BRCA2 testing.  Cervical Cancer Your health care provider may recommend that you be screened regularly for cancer of the pelvic organs (ovaries, uterus, and vagina). This screening involves a pelvic examination, including checking for microscopic changes to the surface of your cervix (Pap test). You may be encouraged to have this screening done every 3 years, beginning at age 69.  For women ages 25-65, health care providers may recommend pelvic exams and Pap testing every 3 years, or they may recommend the Pap and pelvic exam, combined with testing for human papilloma virus (HPV), every 5 years. Some types of HPV increase your risk of cervical cancer. Testing for HPV may also be done on women of any age with unclear Pap test results.  Other health care providers may not recommend any screening for nonpregnant women who are considered low risk for pelvic cancer and who do not have symptoms. Ask your health care provider if a  screening pelvic exam is right for you.  If you have had past treatment for cervical cancer or a condition that could lead to cancer, you need Pap tests and screening for cancer for at least 20 years after your treatment. If Pap tests have been discontinued, your risk factors (such as having a new sexual partner) need to be reassessed to determine if screening should resume. Some women have medical problems that increase the chance of getting cervical cancer. In these cases, your health care provider may recommend more frequent screening and Pap tests.  Colorectal Cancer  This type of cancer can be detected and often prevented.  Routine colorectal cancer screening usually begins at 78 years of age and continues through 78 years of age.  Your health care provider may recommend screening at an earlier age if you have risk factors for colon cancer.  Your health care provider may also recommend using home test kits to check for hidden blood in the stool.  A small camera at the end of a tube can be used to examine your colon directly (sigmoidoscopy or colonoscopy). This is done to check for the earliest forms of colorectal cancer.  Routine screening usually begins at age 39.  Direct examination of the colon should be  repeated every 5-10 years through 78 years of age. However, you may need to be screened more often if early forms of precancerous polyps or small growths are found.  Skin Cancer  Check your skin from head to toe regularly.  Tell your health care provider about any new moles or changes in moles, especially if there is a change in a mole's shape or color.  Also tell your health care provider if you have a mole that is larger than the size of a pencil eraser.  Always use sunscreen. Apply sunscreen liberally and repeatedly throughout the day.  Protect yourself by wearing long sleeves, pants, a wide-brimmed hat, and sunglasses whenever you are outside.  Heart disease, diabetes, and  high blood pressure  High blood pressure causes heart disease and increases the risk of stroke. High blood pressure is more likely to develop in: ? People who have blood pressure in the high end of the normal range (130-139/85-89 mm Hg). ? People who are overweight or obese. ? People who are African American.  If you are 85-7 years of age, have your blood pressure checked every 3-5 years. If you are 67 years of age or older, have your blood pressure checked every year. You should have your blood pressure measured twice-once when you are at a hospital or clinic, and once when you are not at a hospital or clinic. Record the average of the two measurements. To check your blood pressure when you are not at a hospital or clinic, you can use: ? An automated blood pressure machine at a pharmacy. ? A home blood pressure monitor.  If you are between 93 years and 77 years old, ask your health care provider if you should take aspirin to prevent strokes.  Have regular diabetes screenings. This involves taking a blood sample to check your fasting blood sugar level. ? If you are at a normal weight and have a low risk for diabetes, have this test once every three years after 78 years of age. ? If you are overweight and have a high risk for diabetes, consider being tested at a younger age or more often. Preventing infection Hepatitis B  If you have a higher risk for hepatitis B, you should be screened for this virus. You are considered at high risk for hepatitis B if: ? You were born in a country where hepatitis B is common. Ask your health care provider which countries are considered high risk. ? Your parents were born in a high-risk country, and you have not been immunized against hepatitis B (hepatitis B vaccine). ? You have HIV or AIDS. ? You use needles to inject street drugs. ? You live with someone who has hepatitis B. ? You have had sex with someone who has hepatitis B. ? You get hemodialysis  treatment. ? You take certain medicines for conditions, including cancer, organ transplantation, and autoimmune conditions.  Hepatitis C  Blood testing is recommended for: ? Everyone born from [birth year] through 1966. ? Anyone with known risk factors for hepatitis C.  Sexually transmitted infections (STIs)  You should be screened for sexually transmitted infections (STIs) including gonorrhea and chlamydia if: ? You are sexually active and are younger than 78 years of age. ? You are older than 78 years of age and your health care provider tells you that you are at risk for this type of infection. ? Your sexual activity has changed since you were last screened and you are at an increased risk for  chlamydia or gonorrhea. Ask your health care provider if you are at risk.  If you do not have HIV, but are at risk, it may be recommended that you take a prescription medicine daily to prevent HIV infection. This is called pre-exposure prophylaxis (PrEP). You are considered at risk if: ? You are sexually active and do not regularly use condoms or know the HIV status of your partner(s). ? You take drugs by injection. ? You are sexually active with a partner who has HIV.  Talk with your health care provider about whether you are at high risk of being infected with HIV. If you choose to begin PrEP, you should first be tested for HIV. You should then be tested every 3 months for as long as you are taking PrEP. Pregnancy  If you are premenopausal and you may become pregnant, ask your health care provider about preconception counseling.  If you may become pregnant, take 400 to 800 micrograms (mcg) of folic acid every day.  If you want to prevent pregnancy, talk to your health care provider about birth control (contraception). Osteoporosis and menopause  Osteoporosis is a disease in which the bones lose minerals and strength with aging. This can result in serious bone fractures. Your risk for osteoporosis  can be identified using a bone density scan.  If you are 33 years of age or older, or if you are at risk for osteoporosis and fractures, ask your health care provider if you should be screened.  Ask your health care provider whether you should take a calcium or vitamin D supplement to lower your risk for osteoporosis.  Menopause may have certain physical symptoms and risks.  Hormone replacement therapy may reduce some of these symptoms and risks. Talk to your health care provider about whether hormone replacement therapy is right for you. Follow these instructions at home:  Schedule regular health, dental, and eye exams.  Stay current with your immunizations.  Do not use any tobacco products including cigarettes, chewing tobacco, or electronic cigarettes.  If you are pregnant, do not drink alcohol.  If you are breastfeeding, limit how much and how often you drink alcohol.  Limit alcohol intake to no more than 1 drink per day for nonpregnant women. One drink equals 12 ounces of beer, 5 ounces of wine, or 1 ounces of hard liquor.  Do not use street drugs.  Do not share needles.  Ask your health care provider for help if you need support or information about quitting drugs.  Tell your health care provider if you often feel depressed.  Tell your health care provider if you have ever been abused or do not feel safe at home. This information is not intended to replace advice given to you by your health care provider. Make sure you discuss any questions you have with your health care provider. Document Released: 09/27/2010 Document Revised: 08/20/2015 Document Reviewed: 12/16/2014 Elsevier Interactive Patient Education  [employer].

## 2017-12-05 NOTE — Progress Notes (Signed)
HPI:  Using dictation device. Unfortunately this device frequently misinterprets words/phrases.   Gabrielle Robinson is a pleasant 77 y.o. here for follow up and her physical exam. Chronic medical problems summarized below were reviewed for changes and stability and were updated as needed below. These issues and their treatment remain stable for the most part.  Reports is doing great. Mood is great. Got hearing aides - getting used to them. Eating healthy. No regular exercise currently, but plans to start. New concern of new skin lesion L posterior leg. When asked about it she reports is new - not a birth mark or there for a long time. Had planned to see dermatologist, but had not. Denies CP, SOB, DOE, treatment intolerance or new symptoms. Seeing Wynetta Fines today for AWV. Due for labs, mammo, vaccines.  Depression: -chronic, initially presented to me 02/2014 -she opted for low dose lexapro and advised CBT - wishes to continue the lexapro  DM: -well controlled on last check -meds: metformin 516m bid  HLD: -increased pravastatin 10/2015 -denies: hx statin intol, leg cramps, cog changes   -Diet: variety of foods, balance and well rounded, larger portion sizes -Exercise: no regular exercise -Taking folic acid, vitamin D or calcium: no -Diabetes and Dyslipidemia Screening: no fastin, lipids done earlier this year -Vaccines: see vaccine section Epic - due for flu vaccine and tetanus booster -pap history: n/a -FDLMP: see nursing notes -sexual activity: yes, female partner, no new partners -wants STI testing (Hep C if born [birth year]: no -FH breast, colon or ovarian ca: see FH Last mammogram: plans to schedule Last colon cancer screening: colonoscopy in 01/2009 Breast Ca Risk Assessment: see family history and pt history DEXA (>/= 684: did bone density 12/2015  -Alcohol, Tobacco, drug use: see social history  Review of Systems - no fevers, unintentional weight loss, vision loss, hearing  loss, chest pain, sob, hemoptysis, melena, hematochezia, hematuria, genital discharge, bleeding, bruising, loc, thoughts of self harm or SI  Past Medical History:  Diagnosis Date  . Diabetes mellitus without complication ([facility]   . Hyperlipemia 01/05/2009   Qualifier: Diagnosis of  By: JArnoldo MoraleMD, JBalinda Quails  . Major depressive disorder with single episode, in full remission ([facility] 05/28/2015    History reviewed. No pertinent surgical history.  History reviewed. No pertinent family history.  Social History   Socioeconomic History  . Marital status: Married    Spouse name: Not on file  . Number of children: Not on file  . Years of education: Not on file  . Highest education level: Not on file  Occupational History  . Not on file  Social Needs  . Financial resource strain: Not on file  . Food insecurity:    Worry: Not on file    Inability: Not on file  . Transportation needs:    Medical: Not on file    Non-medical: Not on file  Tobacco Use  . Smoking status: Never Smoker  . Smokeless tobacco: Never Used  Substance and Sexual Activity  . Alcohol use: Yes    Comment: occ  . Drug use: No  . Sexual activity: Not on file  Lifestyle  . Physical activity:    Days per week: Not on file    Minutes per session: Not on file  . Stress: Not on file  Relationships  . Social connections:    Talks on phone: Not on file    Gets together: Not on file    Attends religious service: Not on file  Active member of club or organization: Not on file    Attends meetings of clubs or organizations: Not on file    Relationship status: Not on file  Other Topics Concern  . 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  B Complex-C (SUPER B COMPLEX  PO), Take by mouth., Disp: , Rfl:  .  CALCIUM PO, Take by mouth daily., Disp: , Rfl:  .  Cyanocobalamin (VITAMIN B-12 CR PO), Take by mouth., Disp: , Rfl:  .  escitalopram (LEXAPRO) 5 MG tablet, Take 1 tablet by mouth once daily, Disp: 90 tablet, Rfl: 1 .  metFORMIN (GLUCOPHAGE) 500 MG tablet, TAKE ONE TABLET BY MOUTH TWICE DAILY WITH MEALS, Disp: 180 tablet, Rfl: 1 .  pravastatin (PRAVACHOL) 40 MG tablet, take 1 tablet by mouth once daily, Disp: 90 tablet, Rfl: 1  EXAM:  Vitals:   12/05/17 1505  BP: 112/68  Pulse: 79  Temp: 98.7 F (37.1 C)    GENERAL: vitals reviewed and listed below, alert, oriented, appears well hydrated and in no acute distress  HEENT: head atraumatic, PERRLA, normal appearance of eyes, ears, nose and mouth. moist mucus membranes.  NECK: supple, no masses or lymphadenopathy  LUNGS: clear to auscultation bilaterally, no rales, rhonchi or wheeze  CV: HRRR, no peripheral edema or cyanosis, normal pedal pulses  ABDOMEN: bowel sounds normal, soft, non tender to palpation, no masses, no rebound or guarding  GU/BREAST: deferred  SKIN: 10x69m dark brown to black irr dome shaped lesion on the L post upper leg with mild erythema around border and surrounding cafe au lait macule  MS: normal gait, moves all extremities normally  NEURO: normal gait, speech and thought processing grossly intact, muscle tone grossly intact throughout  PSYCH: normal affect, pleasant and cooperative  ASSESSMENT AND PLAN:  Discussed the following assessment and plan:  Physical exam -Discussed and advised all [place]ventive services health task force level A and B recommendations for age, sex and risks. -Advised at least 150 minutes of exercise per week and a healthy diet  -labs, studies and vaccines per orders this encounter -mammogram -skin lesion found on exam - see below -vaccines flu shot and tetanus booster  2. Depression, recurrent ([facility] -stable, cont current tx -phq9  neg  3. Elevated blood pressure reading -on arrival, normal on seated recheck  4. Type 2 diabetes mellitus without complication, without long-term current use of insulin (HCC) -continue metformin, increase exercise, healthy diet - Basic metabolic panel - Hemoglobin A1c  5. Hyperlipidemia associated with type 2 diabetes mellitus ([facility] -cont current tx, increase exercise  6. Skin lesion -we discussed possible serious and likely etiologies, workup and treatment, treatment risks and return precautions -after this discussion, JAnalyahopted for urgent referral to dermatology - referral placed - Ambulatory referral to Dermatology  7. Screening for depression neg   Patient advised to return to clinic immediately if symptoms worsen or persist or new concerns.  Patient Instructions  BEFORE YOU LEAVE: -labs -AWV with SManuela Schwartz-flu shot and tetanus booster -follow up: 3-4 months  -We placed a referral for you as discussed to the dermatologist. If you have not been contacted about this appointment in  the next 1 week, please contact our office.  Increase exercise.  Schedule eye exam and mammogram.  We have ordered labs or studies at this visit. It can take up to 1-2 weeks for results and processing. IF results require follow up or explanation, we will call you with instructions. Clinically stable results will be released to your [facility]. If you have not heard from [place] or cannot find your results in [facility] in 2 weeks please contact our office at [phone].  If you are not yet signed up for [facility], please consider signing up.        Preventive Care 60 Years and Older, Female Preventive care refers to lifestyle choices and visits with your health care provider that can promote health and wellness. What does preventive care include?  A yearly physical exam. This is also called an annual well check.  Dental exams once or twice a year.  Routine eye exams. Ask your health care provider  how often you should have your eyes checked.  Personal lifestyle choices, including: ? Daily care of your teeth and gums. ? Regular physical activity. ? Eating a healthy diet. ? Avoiding tobacco and drug use. ? Limiting alcohol use. ? Practicing safe sex. ? Taking vitamin and mineral supplements as recommended by your health care provider. What happens during an annual well check? The services and screenings done by your health care provider during your annual well check will depend on your age, overall health, lifestyle risk factors, and family history of disease. Counseling Your health care provider may ask you questions about your:  Alcohol use.  Tobacco use.  Drug use.  Emotional well-being.  Home and relationship well-being.  Sexual activity.  Eating habits.  History of falls.  Memory and ability to understand (cognition).  Work and work Statistician.  Reproductive health.  Screening You may have the following tests or measurements:  Height, weight, and BMI.  Blood pressure.  Lipid and cholesterol levels. These may be checked every 5 years, or more frequently if you are over 52 years old.  Skin check.  Lung cancer screening. You may have this screening every year starting at age 36 if you have a 30-pack-year history of smoking and currently smoke or have quit within the past 15 years.  Fecal occult blood test (FOBT) of the stool. You may have this test every year starting at age 54.  Flexible sigmoidoscopy or colonoscopy. You may have a sigmoidoscopy every 5 years or a colonoscopy every 10 years starting at age 93.  Hepatitis C blood test.  Hepatitis B blood test.  Sexually transmitted disease (STD) testing.  Diabetes screening. This is done by checking your blood sugar (glucose) after you have not eaten for a while (fasting). You may have this done every 1-3 years.  Bone density scan. This is done to screen for osteoporosis. You may have this done  starting at age 20.  Mammogram. This may be done every 1-2 years. Talk to your health care provider about how often you should have regular mammograms.  Talk with your health care provider about your test results, treatment options, and if necessary, the need for more tests. Vaccines Your health care provider may recommend certain vaccines, such as:  Influenza vaccine. This is recommended every year.  Tetanus, diphtheria, and acellular pertussis (Tdap, Td) vaccine. You may need a Td booster every 10 years.  Varicella vaccine. You may need this if you have not been vaccinated.  Zoster vaccine. You may need this  after age 17.  Measles, mumps, and rubella (MMR) vaccine. You may need at least one dose of MMR if you were born in 1957 or later. You may also need a second dose.  Pneumococcal 13-valent conjugate (PCV13) vaccine. One dose is recommended after age 74.  Pneumococcal polysaccharide (PPSV23) vaccine. One dose is recommended after age 38.  Meningococcal vaccine. You may need this if you have certain conditions.  Hepatitis A vaccine. You may need this if you have certain conditions or if you travel or work in places where you may be exposed to hepatitis A.  Hepatitis B vaccine. You may need this if you have certain conditions or if you travel or work in places where you may be exposed to hepatitis B.  Haemophilus influenzae type b (Hib) vaccine. You may need this if you have certain conditions.  Talk to your health care provider about which screenings and vaccines you need and how often you need them. This information is not intended to replace advice given to you by your health care provider. Make sure you discuss any questions you have with your health care provider. Document Released: 04/10/2015 Document Revised: 12/02/2015 Document Reviewed: 01/13/2015 Elsevier Interactive Patient Education  2018 [employer].     No follow-ups on file.  Lucretia Kern, DO

## 2017-12-05 NOTE — Progress Notes (Signed)
Gabrielle Vasco R Chavon Lucarelli, DO  

## 2017-12-05 NOTE — Patient Instructions (Addendum)
BEFORE YOU LEAVE: -labs -AWV with Manuela Schwartz -flu shot and tetanus booster -follow up: 3-4 months  -We placed a referral for you as discussed to the dermatologist. If you have not been contacted about this appointment in the next 1 week, please contact our office.  Increase exercise.  Schedule eye exam and mammogram.  Get calcium from diet - 1274m  Vit D3 [phone] IU daily  We have ordered labs or studies at this visit. It can take up to 1-2 weeks for results and processing. IF results require follow up or explanation, we will call you with instructions. Clinically stable results will be released to your [facility] If you have not heard from [place] cannot find your results in [facility] 2 weeks please contact our office at [phone]  If you are not yet signed up for [facility] please consider signing up.        Preventive Care 613Years and Older, Female Preventive care refers to lifestyle choices and visits with your health care provider that can promote health and wellness. What does preventive care include?  A yearly physical exam. This is also called an annual well check.  Dental exams once or twice a year.  Routine eye exams. Ask your health care provider how often you should have your eyes checked.  Personal lifestyle choices, including: ? Daily care of your teeth and gums. ? Regular physical activity. ? Eating a healthy diet. ? Avoiding tobacco and drug use. ? Limiting alcohol use. ? Practicing safe sex. ? Taking vitamin and mineral supplements as recommended by your health care provider. What happens during an annual well check? The services and screenings done by your health care provider during your annual well check will depend on your age, overall health, lifestyle risk factors, and family history of disease. Counseling Your health care provider may ask you questions about your:  Alcohol use.  Tobacco use.  Drug use.  Emotional well-being.  Home and  relationship well-being.  Sexual activity.  Eating habits.  History of falls.  Memory and ability to understand (cognition).  Work and work eStatistician  Reproductive health.  Screening You may have the following tests or measurements:  Height, weight, and BMI.  Blood pressure.  Lipid and cholesterol levels. These may be checked every 5 years, or more frequently if you are over 540years old.  Skin check.  Lung cancer screening. You may have this screening every year starting at age 1852if you have a 30-pack-year history of smoking and currently smoke or have quit within the past 15 years.  Fecal occult blood test (FOBT) of the stool. You may have this test every year starting at age 181  Flexible sigmoidoscopy or colonoscopy. You may have a sigmoidoscopy every 5 years or a colonoscopy every 10 years starting at age 78  Hepatitis C blood test.  Hepatitis B blood test.  Sexually transmitted disease (STD) testing.  Diabetes screening. This is done by checking your blood sugar (glucose) after you have not eaten for a while (fasting). You may have this done every 1-3 years.  Bone density scan. This is done to screen for osteoporosis. You may have this done starting at age 78  Mammogram. This may be done every 1-2 years. Talk to your health care provider about how often you should have regular mammograms.  Talk with your health care provider about your test results, treatment options, and if necessary, the need for more tests. Vaccines Your health care provider may recommend  certain vaccines, such as:  Influenza vaccine. This is recommended every year.  Tetanus, diphtheria, and acellular pertussis (Tdap, Td) vaccine. You may need a Td booster every 10 years.  Varicella vaccine. You may need this if you have not been vaccinated.  Zoster vaccine. You may need this after age 87.  Measles, mumps, and rubella (MMR) vaccine. You may need at least one dose of MMR if you  were born in 1957 or later. You may also need a second dose.  Pneumococcal 13-valent conjugate (PCV13) vaccine. One dose is recommended after age 61.  Pneumococcal polysaccharide (PPSV23) vaccine. One dose is recommended after age 47.  Meningococcal vaccine. You may need this if you have certain conditions.  Hepatitis A vaccine. You may need this if you have certain conditions or if you travel or work in places where you may be exposed to hepatitis A.  Hepatitis B vaccine. You may need this if you have certain conditions or if you travel or work in places where you may be exposed to hepatitis B.  Haemophilus influenzae type b (Hib) vaccine. You may need this if you have certain conditions.  Talk to your health care provider about which screenings and vaccines you need and how often you need them. This information is not intended to replace advice given to you by your health care provider. Make sure you discuss any questions you have with your health care provider. Document Released: 04/10/2015 Document Revised: 12/02/2015 Document Reviewed: 01/13/2015 Elsevier Interactive Patient Education  [employer].

## 2017-12-05 NOTE — Addendum Note (Signed)
Addended by: Agnes Lawrence on: 12/05/2017 03:59 PM   Modules accepted: Orders

## 2018-01-26 ENCOUNTER — Other Ambulatory Visit: Payer: Self-pay | Admitting: General Surgery

## 2018-01-26 DIAGNOSIS — C4372 Malignant melanoma of left lower limb, including hip: Secondary | ICD-10-CM

## 2018-01-29 NOTE — Pre-Procedure Instructions (Signed)
DENETRIA LUEVANOS  01/29/2018      [facility] PH[facility] # [address], [place] [address] Phone: [phone] Fax: [phone]    Your procedure is scheduled on February 08, 2018.  Report to [facility] Admitting at 1200 PM.  Call this number if you have problems the morning of surgery:  [phone]   Remember:  Do not eat or drink after midnight.  You may drink clear liquids until 1100 AM.  Clear liquids allowed are:      Water, Juice (non-citric and without pulp), Clear Tea, Black Coffee only and Gatorade    Take these medicines the morning of surgery with A SIP OF WATER  escitalopram (lexapro) Systane eye drops-if needed  7 days prior to surgery STOP taking any Aspirin (unless otherwise instructed by your surgeon), Aleve, Naproxen, Ibuprofen, Motrin, Advil, Goody's, BC's, all herbal medications, fish oil, and all vitamins   WHAT DO I DO ABOUT MY DIABETES MEDICATION?   Marland Kitchen Do not take oral diabetes medicines (pills) the morning of surgery-metformin (glucophage).  How to Manage Your Diabetes Before and After Surgery  Why is it important to control my blood sugar before and after surgery? . Improving blood sugar levels before and after surgery helps healing and can limit problems. . A way of improving blood sugar control is eating a healthy diet by: o  Eating less sugar and carbohydrates o  Increasing activity/exercise o  Talking with your doctor about reaching your blood sugar goals . High blood sugars (greater than 180 mg/dL) can raise your risk of infections and slow your recovery, so you will need to focus on controlling your diabetes during the weeks before surgery. . Make sure that the doctor who takes care of your diabetes knows about your planned surgery including the date and location.  How do I manage my blood sugar before surgery? . Check your blood sugar at least 4 times a day, starting 2 days before  surgery, to make sure that the level is not too high or low. o Check your blood sugar the morning of your surgery when you wake up and every 2 hours until you get to the Short Stay unit. . If your blood sugar is less than 70 mg/dL, you will need to treat for low blood sugar: o Do not take insulin. o Treat a low blood sugar (less than 70 mg/dL) with  cup of clear juice (cranberry or apple), 4 glucose tablets, OR glucose gel. Recheck blood sugar in 15 minutes after treatment (to make sure it is greater than 70 mg/dL). If your blood sugar is not greater than 70 mg/dL on recheck, call [phone] o  for further instructions. . Report your blood sugar to the short stay nurse when you get to Short Stay.  . If you are admitted to the hospital after surgery: o Your blood sugar will be checked by the staff and you will probably be given insulin after surgery (instead of oral diabetes medicines) to make sure you have good blood sugar levels. o The goal for blood sugar control after surgery is 80-180 mg/dL.  Reviewed and Endorsed by [facility] Patient Education Committee, August 2015 [facility]- Preparing For Surgery  Before surgery, you can play an important role. Because skin is not sterile, your skin needs to be as free of germs as possible. You can reduce the number of germs on your skin by washing with  CHG (chlorahexidine gluconate) Soap before surgery.  CHG is an antiseptic cleaner which kills germs and bonds with the skin to continue killing germs even after washing.    Oral Hygiene is also important to reduce your risk of infection.  Remember - BRUSH YOUR TEETH THE MORNING OF SURGERY WITH YOUR REGULAR TOOTHPASTE  Please do not use if you have an allergy to CHG or antibacterial soaps. If your skin becomes reddened/irritated stop using the CHG.  Do not shave (including legs and underarms) for at least 48 hours prior to first CHG shower. It is OK to shave your face.  Please follow these  instructions carefully.   1. Shower the NIGHT BEFORE SURGERY and the MORNING OF SURGERY with CHG.   2. If you chose to wash your hair, wash your hair first as usual with your normal shampoo.  3. After you shampoo, rinse your hair and body thoroughly to remove the shampoo.  4. Use CHG as you would any other liquid soap. You can apply CHG directly to the skin and wash gently with a scrungie or a clean washcloth.   5. Apply the CHG Soap to your body ONLY FROM THE NECK DOWN.  Do not use on open wounds or open sores. Avoid contact with your eyes, ears, mouth and genitals (private parts). Wash Face and genitals (private parts)  with your normal soap.  6. Wash thoroughly, paying special attention to the area where your surgery will be performed.  7. Thoroughly rinse your body with warm water from the neck down.  8. DO NOT shower/wash with your normal soap after using and rinsing off the CHG Soap.  9. Pat yourself dry with a CLEAN TOWEL.  10. Wear CLEAN PAJAMAS to bed the night before surgery, wear comfortable clothes the morning of surgery  11. Place CLEAN SHEETS on your bed the night of your first shower and DO NOT SLEEP WITH PETS.   Day of Surgery:  Do not apply any deodorants/lotions.  Please wear clean clothes to the hospital/surgery center.   Remember to brush your teeth WITH YOUR REGULAR TOOTHPASTE.    Do not wear jewelry, make-up or nail polish.  Do not wear lotions, powders, or perfumes, or deodorant.  Do not shave 48 hours prior to surgery.   Do not bring valuables to the hospital.  [facility] is not responsible for any belongings or valuables.  Contacts, dentures or bridgework may not be worn into surgery.  Leave your suitcase in the car.  After surgery it may be brought to your room.  For patients admitted to the hospital, discharge time will be determined by your treatment team.  Patients discharged the day of surgery will not be allowed to drive home.   Please read  over the following fact sheets that you were given. Pain Booklet, Coughing and Deep Breathing and Surgical Site Infection Prevention

## 2018-01-30 ENCOUNTER — Encounter (HOSPITAL_COMMUNITY): Payer: Self-pay

## 2018-01-30 ENCOUNTER — Other Ambulatory Visit: Payer: Self-pay

## 2018-01-30 ENCOUNTER — Encounter (HOSPITAL_COMMUNITY)
Admission: RE | Admit: 2018-01-30 | Discharge: 2018-01-30 | Disposition: A | Discharge status: Home / Self Care | Payer: Medicare Other | Source: Ambulatory Visit | Attending: General Surgery | Admitting: General Surgery

## 2018-01-30 DIAGNOSIS — Z01818 Encounter for other preprocedural examination: Secondary | ICD-10-CM | POA: Diagnosis present

## 2018-01-30 HISTORY — DX: Malignant (primary) neoplasm, unspecified: C80.1

## 2018-01-30 LAB — COMPREHENSIVE METABOLIC PANEL
ALBUMIN: 4 g/dL (ref 3.5–5.0)
ALK PHOS: 62 U/L (ref 38–126)
ALT: 19 U/L (ref 0–44)
AST: 22 U/L (ref 15–41)
Anion gap: 8 (ref 5–15)
BUN: 13 mg/dL (ref 8–23)
CO2: 28 mmol/L (ref 22–32)
Calcium: 9.5 mg/dL (ref 8.9–10.3)
Chloride: 104 mmol/L (ref 98–111)
Creatinine, Ser: 0.79 mg/dL (ref 0.44–1.00)
GFR calc Af Amer: >60 mL/min (ref >60)
GFR calc non Af Amer: >60 mL/min (ref >60)
GLUCOSE: 130 mg/dL — AB (ref 70–99)
POTASSIUM: 4.4 mmol/L (ref 3.5–5.1)
Sodium: 140 mmol/L (ref 135–145)
TOTAL PROTEIN: 7.1 g/dL (ref 6.5–8.1)
Total Bilirubin: 0.6 mg/dL (ref 0.3–1.2)

## 2018-01-30 LAB — CBC WITH DIFFERENTIAL/PLATELET
Abs Immature Granulocytes: 0.01 10*3/uL (ref 0.00–0.07)
BASOS PCT: 1 %
Basophils Absolute: 0.1 10*3/uL (ref 0.0–0.1)
Eosinophils Absolute: 0.3 10*3/uL (ref 0.0–0.5)
Eosinophils Relative: 5 %
HCT: 41 % (ref 36.0–46.0)
HEMOGLOBIN: 13.1 g/dL (ref 12.0–15.0)
Immature Granulocytes: 0 %
Lymphocytes Relative: 31 %
Lymphs Abs: 1.8 10*3/uL (ref 0.7–4.0)
MCH: 29.3 pg (ref 26.0–34.0)
MCHC: 32 g/dL (ref 30.0–36.0)
MCV: 91.7 fL (ref 80.0–100.0)
Monocytes Absolute: 0.8 10*3/uL (ref 0.1–1.0)
Monocytes Relative: 13 %
NEUTROS ABS: 3 10*3/uL (ref 1.7–7.7)
Neutrophils Relative %: 50 %
Platelets: 250 10*3/uL (ref 150–400)
RBC: 4.47 MIL/uL (ref 3.87–5.11)
RDW: 12.2 % (ref 11.5–15.5)
WBC: 5.8 10*3/uL (ref 4.0–10.5)
nRBC: 0 % (ref 0.0–0.2)

## 2018-01-30 LAB — GLUCOSE, CAPILLARY: GLUCOSE-CAPILLARY: 203 mg/dL — AB (ref 70–99)

## 2018-01-30 LAB — HEMOGLOBIN A1C
Hgb A1c MFr Bld: 6.5 % — ABNORMAL HIGH (ref 4.8–5.6)
Mean Plasma Glucose: 139.85 mg/dL

## 2018-01-30 NOTE — Progress Notes (Signed)
PCP: Colin Benton  Cardiologist: denies  DM: Type 2  Does not regularly check BS  SA: denies  Pt denies SOB, cough, fever, chest pain  Pt states understanding of instructions given for day of surgery.

## 2018-02-06 NOTE — H&P (Signed)
Gabrielle Robinson Documented: 01/26/2018 11:51 AM Location: [facility] Patient #: [id number] DOB: 1940/08/09 Married / Language: [name] / Race: White Female   History of Present Illness Gabrielle Klein MD; 01/26/2018 12:53 PM) The patient is a 77 year old female who presents with malignant melanoma. Patient is a 77 year old female referred by Dr. Jarome Matin for a new diagnosis of malignant melanoma of the left superior lateral posterior thigh. The patient has numerous skin tags and was scratching one day and noted bleeding. This was unusual and so she brought it to the attention of her primary care doctor. She also called her dermatologist. Between the 2 of them they got a more rapid appointment. She underwent shave biopsy that was positive for a 1.5 mm melanoma, superficial spreading subtype. Margins were uninvolved. Mitotic index was elevated at 5/mm. There was no evidence of satellitosis, ulceration, LVI, tumor regression, or neurotropism. Tumor infiltrating lymphocytes were present and brisk.  She presents to discuss surgical management.  pathology is from [place] [phone]     Past Surgical History ([name], [place]; 01/26/2018 11:51 AM) Oral Surgery   Diagnostic Studies History ([name], [name]; 01/26/2018 11:51 AM) Colonoscopy  5-10 years ago Mammogram  1-3 years ago Pap Smear  >5 years ago  Allergies ([name], [name]; 01/26/2018 11:52 AM) No Known Drug Allergies [01/26/2018]: Allergies Reconciled   Medication History ([name], [name]; 01/26/2018 11:53 AM) Escitalopram Oxalate (5MG  Tablet, Oral) Active. metFORMIN HCl (500MG  Tablet, Oral) Active. Pravastatin Sodium (40MG  Tablet, Oral) Active. B Complete (Oral) Active. Vitamin B12 (Oral) Specific strength unknown - Active. Medications Reconciled  Social History ([name], [name]; 01/26/2018 11:51 AM) Alcohol use  Moderate alcohol use. Caffeine use  Carbonated  beverages, Tea. No drug use  Tobacco use  Never smoker.  Family History ([name], [name]; 01/26/2018 11:51 AM) Arthritis  Father. Diabetes Mellitus  Father, Mother, Sister. Migraine Headache  Father. Respiratory Condition  Mother.  Pregnancy / Birth History ([name], [name]; 01/26/2018 11:51 AM) Age at menarche  34 years. Age of menopause  42-55 Gravida  1 Maternal age  15-25 Para  1 Regular periods   Other Problems ([name], [name]; 01/26/2018 11:51 AM) Diabetes Mellitus  Hypercholesterolemia  Melanoma     Review of Systems (Tanisha A. [name]; 01/26/2018 11:51 AM) General Not Present- Appetite Loss, Chills, Fatigue, Fever, Night Sweats, Weight Gain and Weight Loss. Skin Present- Change in Wart/Mole. Not Present- Dryness, Hives, Jaundice, New Lesions, Non-Healing Wounds, Rash and Ulcer. HEENT Present- Hearing Loss and Wears glasses/contact lenses. Not Present- Earache, Hoarseness, Nose Bleed, Oral Ulcers, Ringing in the Ears, Seasonal Allergies, Sinus Pain, Sore Throat, Visual Disturbances and Yellow Eyes. Respiratory Not Present- Bloody sputum, Chronic Cough, Difficulty Breathing, Snoring and Wheezing. Breast Not Present- Breast Mass, Breast Pain, Nipple Discharge and Skin Changes. Cardiovascular Not Present- Chest Pain, Difficulty Breathing Lying Down, Leg Cramps, Palpitations, Rapid Heart Rate, Shortness of Breath and Swelling of Extremities. Gastrointestinal Not Present- Abdominal Pain, Bloating, Bloody Stool, Change in Bowel Habits, Chronic diarrhea, Constipation, Difficulty Swallowing, Excessive gas, Gets full quickly at meals, Hemorrhoids, Indigestion, Nausea, Rectal Pain and Vomiting. Female Genitourinary Present- Nocturia. Not Present- Frequency, Painful Urination, Pelvic Pain and Urgency. Musculoskeletal Not Present- Back Pain, Joint Pain, Joint Stiffness, Muscle Pain, Muscle Weakness and Swelling of Extremities. Neurological Not Present-  Decreased Memory, Fainting, Headaches, Numbness, Seizures, Tingling, Tremor, Trouble walking and Weakness. Psychiatric Not Present- Anxiety, Bipolar, Change in Sleep Pattern,  Depression, Fearful and Frequent crying. Endocrine Present- New Diabetes. Not Present- Cold Intolerance, Excessive Hunger, Hair Changes, Heat Intolerance and Hot flashes. Hematology Not Present- Blood Thinners, Easy Bruising, Excessive bleeding, Gland problems, HIV and Persistent Infections.  Vitals (Tanisha A. [name]; 01/26/2018 11:51 AM) 01/26/2018 11:51 AM Weight: 118 lb Height: 60in Body Surface Area: 1.49 m Body Mass Index: 23.05 kg/m  Temp.: 98.7F  Pulse: 77 (Regular)  BP: 118/74 (Sitting, Left Arm, Standard)       Physical Exam Gabrielle Klein MD; 01/26/2018 12:55 PM) General Mental Status-Alert. General Appearance-Consistent with stated age. Hydration-Well hydrated. Voice-Normal.  Integumentary Note: left hip/posterolateral thigh wtih an area of numerous macular pigmented lesions and scar from bx. pigmented areas would be in excisional site.   Head and Neck Head-normocephalic, atraumatic with no lesions or palpable masses. Trachea-midline. Thyroid Gland Characteristics - normal size and consistency.  Eye Eyeball - Bilateral-Extraocular movements intact. Sclera/Conjunctiva - Bilateral-No scleral icterus.  Chest and Lung Exam Chest and lung exam reveals -quiet, even and easy respiratory effort with no use of accessory muscles and on auscultation, normal breath sounds, no adventitious sounds and normal vocal resonance. Inspection Chest Wall - Normal. Back - normal.  Cardiovascular Cardiovascular examination reveals -normal heart sounds, regular rate and rhythm with no murmurs and normal pedal pulses bilaterally.  Abdomen Inspection Inspection of the abdomen reveals - No Hernias. Palpation/Percussion Palpation and Percussion of the abdomen reveal - Soft, Non  Tender, No Rebound tenderness, No Rigidity (guarding) and No hepatosplenomegaly. Auscultation Auscultation of the abdomen reveals - Bowel sounds normal.  Neurologic Neurologic evaluation reveals -alert and oriented x 3 with no impairment of recent or remote memory. Mental Status-Normal.  Musculoskeletal Global Assessment -Note: no gross deformities.  Normal Exam - Left-Upper Extremity Strength Normal and Lower Extremity Strength Normal. Normal Exam - Right-Upper Extremity Strength Normal and Lower Extremity Strength Normal.  Lymphatic Head & Neck  General Head & Neck Lymphatics: Bilateral - Description - Normal. Axillary  General Axillary Region: Bilateral - Description - Normal. Tenderness - Non Tender. Femoral & Inguinal  Generalized Femoral & Inguinal Lymphatics: Bilateral - Description - No Generalized lymphadenopathy.    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  Will plan reexcision with advancement flap closure and sentinel node mapping and biopsy. Will do this at the first available opportunity.  REviewed risks of surgery including numbness, wound breakdown, swelling, seroma, and lymphedema. Pt understands and wishes to proceed. No medical comorbidities to preclude surgery. Current Plans Schedule for Surgery Pt Education - Melanoma: skin cancer   Signed by Gabrielle Klein, MD (01/26/2018 12:58 PM)

## 2018-02-08 ENCOUNTER — Ambulatory Visit (HOSPITAL_COMMUNITY): Payer: Medicare Other | Admitting: Anesthesiology

## 2018-02-08 ENCOUNTER — Encounter (HOSPITAL_COMMUNITY)
Admission: RE | Disposition: A | Discharge status: Home / Self Care | Payer: Self-pay | Source: Ambulatory Visit | Attending: General Surgery

## 2018-02-08 ENCOUNTER — Ambulatory Visit (HOSPITAL_COMMUNITY)
Admission: RE | Admit: 2018-02-08 | Discharge: 2018-02-08 | Disposition: A | Discharge status: Home / Self Care | Payer: Medicare Other | Source: Ambulatory Visit | Attending: General Surgery | Admitting: General Surgery

## 2018-02-08 ENCOUNTER — Encounter (HOSPITAL_COMMUNITY)
Admission: RE | Admit: 2018-02-08 | Discharge: 2018-02-08 | Disposition: A | Discharge status: Home / Self Care | Payer: Medicare Other | Source: Ambulatory Visit | Attending: General Surgery | Admitting: General Surgery

## 2018-02-08 ENCOUNTER — Encounter (HOSPITAL_COMMUNITY): Payer: Self-pay | Admitting: *Deleted

## 2018-02-08 DIAGNOSIS — E7800 Pure hypercholesterolemia, unspecified: Secondary | ICD-10-CM | POA: Insufficient documentation

## 2018-02-08 DIAGNOSIS — C4372 Malignant melanoma of left lower limb, including hip: Secondary | ICD-10-CM | POA: Diagnosis present

## 2018-02-08 DIAGNOSIS — C774 Secondary and unspecified malignant neoplasm of inguinal and lower limb lymph nodes: Secondary | ICD-10-CM | POA: Insufficient documentation

## 2018-02-08 DIAGNOSIS — F325 Major depressive disorder, single episode, in full remission: Secondary | ICD-10-CM | POA: Insufficient documentation

## 2018-02-08 DIAGNOSIS — Z79899 Other long term (current) drug therapy: Secondary | ICD-10-CM | POA: Insufficient documentation

## 2018-02-08 DIAGNOSIS — E119 Type 2 diabetes mellitus without complications: Secondary | ICD-10-CM | POA: Insufficient documentation

## 2018-02-08 DIAGNOSIS — Z7984 Long term (current) use of oral hypoglycemic drugs: Secondary | ICD-10-CM | POA: Diagnosis not present

## 2018-02-08 DIAGNOSIS — D2272 Melanocytic nevi of left lower limb, including hip: Secondary | ICD-10-CM | POA: Insufficient documentation

## 2018-02-08 HISTORY — PX: MELANOMA EXCISION: SHX5266

## 2018-02-08 HISTORY — PX: SENTINEL NODE BIOPSY: SHX6608

## 2018-02-08 LAB — GLUCOSE, CAPILLARY
GLUCOSE-CAPILLARY: 128 mg/dL — AB (ref 70–99)
Glucose-Capillary: 140 mg/dL — ABNORMAL HIGH (ref 70–99)
Glucose-Capillary: 124 mg/dL — ABNORMAL HIGH (ref 70–99)

## 2018-02-08 SURGERY — EXCISION, MELANOMA
Anesthesia: General | Site: Leg Upper | Laterality: Left

## 2018-02-08 MED ORDER — LACTATED RINGERS IV SOLN
INTRAVENOUS | Status: DC
  Administered 2018-02-08 (×2): via INTRAVENOUS

## 2018-02-08 MED ORDER — OXYCODONE HCL 5 MG PO TABS: 5.0000 mg | ORAL_TABLET | Freq: Four times a day (QID) | ORAL | 0 refills | Status: DC | PRN

## 2018-02-08 MED ORDER — ACETAMINOPHEN 500 MG PO TABS
1000.0000 mg | ORAL_TABLET | ORAL | Status: AC
  Administered 2018-02-08: 1000 mg via ORAL

## 2018-02-08 MED ORDER — SUGAMMADEX SODIUM 200 MG/2ML IV SOLN
INTRAVENOUS | Status: AC
  Filled 2018-02-08: qty 2

## 2018-02-08 MED ORDER — OXYCODONE HCL 5 MG PO TABS: 5.0000 mg | ORAL_TABLET | Freq: Once | ORAL | Status: DC | PRN

## 2018-02-08 MED ORDER — DEXAMETHASONE SODIUM PHOSPHATE 10 MG/ML IJ SOLN
INTRAMUSCULAR | Status: DC | PRN
  Administered 2018-02-08: 5 mg via INTRAVENOUS

## 2018-02-08 MED ORDER — HYDRALAZINE HCL 20 MG/ML IJ SOLN: 10.0000 mg | INTRAMUSCULAR | Status: DC | PRN

## 2018-02-08 MED ORDER — FENTANYL CITRATE (PF) 250 MCG/5ML IJ SOLN
INTRAMUSCULAR | Status: DC | PRN
  Administered 2018-02-08: 25 ug via INTRAVENOUS
  Administered 2018-02-08: 100 ug via INTRAVENOUS
  Administered 2018-02-08: 25 ug via INTRAVENOUS

## 2018-02-08 MED ORDER — ROCURONIUM BROMIDE 10 MG/ML (PF) SYRINGE
PREFILLED_SYRINGE | INTRAVENOUS | Status: DC | PRN
  Administered 2018-02-08: 50 mg via INTRAVENOUS

## 2018-02-08 MED ORDER — DEXAMETHASONE SODIUM PHOSPHATE 10 MG/ML IJ SOLN
INTRAMUSCULAR | Status: AC
  Filled 2018-02-08: qty 1

## 2018-02-08 MED ORDER — PHENYLEPHRINE 40 MCG/ML (10ML) SYRINGE FOR IV PUSH (FOR BLOOD PRESSURE SUPPORT)
PREFILLED_SYRINGE | INTRAVENOUS | Status: AC
  Filled 2018-02-08: qty 10

## 2018-02-08 MED ORDER — CEFAZOLIN SODIUM-DEXTROSE 2-4 GM/100ML-% IV SOLN
INTRAVENOUS | Status: AC
  Filled 2018-02-08: qty 100

## 2018-02-08 MED ORDER — CHLORHEXIDINE GLUCONATE CLOTH 2 % EX PADS: 6.0000 | MEDICATED_PAD | Freq: Once | CUTANEOUS | Status: DC

## 2018-02-08 MED ORDER — GABAPENTIN 300 MG PO CAPS
ORAL_CAPSULE | ORAL | Status: AC
  Filled 2018-02-08: qty 1

## 2018-02-08 MED ORDER — METHYLENE BLUE 1 % INJ SOLN
INTRAMUSCULAR | Status: DC | PRN
  Administered 2018-02-08: 2 mL

## 2018-02-08 MED ORDER — LIDOCAINE HCL 1 % IJ SOLN
INTRAMUSCULAR | Status: DC | PRN
  Administered 2018-02-08: 42 mL via INTRAMUSCULAR

## 2018-02-08 MED ORDER — LIDOCAINE 2% (20 MG/ML) 5 ML SYRINGE
INTRAMUSCULAR | Status: DC | PRN
  Administered 2018-02-08 (×2): 100 mg via INTRAVENOUS

## 2018-02-08 MED ORDER — CEFAZOLIN SODIUM-DEXTROSE 2-4 GM/100ML-% IV SOLN
2.0000 g | INTRAVENOUS | Status: AC
  Administered 2018-02-08: 2 g via INTRAVENOUS

## 2018-02-08 MED ORDER — BUPIVACAINE-EPINEPHRINE (PF) 0.25% -1:200000 IJ SOLN
INTRAMUSCULAR | Status: AC
  Filled 2018-02-08: qty 30

## 2018-02-08 MED ORDER — LIDOCAINE HCL (PF) 1 % IJ SOLN
INTRAMUSCULAR | Status: AC
  Filled 2018-02-08: qty 30

## 2018-02-08 MED ORDER — FENTANYL CITRATE (PF) 100 MCG/2ML IJ SOLN: 25.0000 ug | INTRAMUSCULAR | Status: DC | PRN

## 2018-02-08 MED ORDER — LIDOCAINE 2% (20 MG/ML) 5 ML SYRINGE
INTRAMUSCULAR | Status: AC
  Filled 2018-02-08: qty 10

## 2018-02-08 MED ORDER — 0.9 % SODIUM CHLORIDE (POUR BTL) OPTIME
TOPICAL | Status: DC | PRN
  Administered 2018-02-08: 1000 mL

## 2018-02-08 MED ORDER — TECHNETIUM TC 99M SULFUR COLLOID FILTERED
0.5000 | Freq: Once | INTRAVENOUS | Status: AC | PRN
  Administered 2018-02-08: 0.5 via INTRADERMAL

## 2018-02-08 MED ORDER — PROPOFOL 10 MG/ML IV BOLUS
INTRAVENOUS | Status: DC | PRN
  Administered 2018-02-08: 20 mg via INTRAVENOUS
  Administered 2018-02-08: 100 mg via INTRAVENOUS

## 2018-02-08 MED ORDER — ACETAMINOPHEN 500 MG PO TABS
ORAL_TABLET | ORAL | Status: AC
  Filled 2018-02-08: qty 2

## 2018-02-08 MED ORDER — EPHEDRINE SULFATE-NACL 50-0.9 MG/10ML-% IV SOSY
PREFILLED_SYRINGE | INTRAVENOUS | Status: DC | PRN
  Administered 2018-02-08: 10 mg via INTRAVENOUS
  Administered 2018-02-08: 5 mg via INTRAVENOUS

## 2018-02-08 MED ORDER — OXYCODONE HCL 5 MG/5ML PO SOLN: 5.0000 mg | Freq: Once | ORAL | Status: DC | PRN

## 2018-02-08 MED ORDER — ARTIFICIAL TEARS OPHTHALMIC OINT
TOPICAL_OINTMENT | OPHTHALMIC | Status: AC
  Filled 2018-02-08: qty 3.5

## 2018-02-08 MED ORDER — GABAPENTIN 300 MG PO CAPS
300.0000 mg | ORAL_CAPSULE | ORAL | Status: AC
  Administered 2018-02-08: 300 mg via ORAL

## 2018-02-08 MED ORDER — ONDANSETRON HCL 4 MG/2ML IJ SOLN
INTRAMUSCULAR | Status: DC | PRN
  Administered 2018-02-08: 4 mg via INTRAVENOUS

## 2018-02-08 MED ORDER — SUGAMMADEX SODIUM 200 MG/2ML IV SOLN
INTRAVENOUS | Status: DC | PRN
  Administered 2018-02-08: 200 mg via INTRAVENOUS

## 2018-02-08 MED ORDER — FENTANYL CITRATE (PF) 250 MCG/5ML IJ SOLN
INTRAMUSCULAR | Status: AC
  Filled 2018-02-08: qty 5

## 2018-02-08 MED ORDER — METHYLENE BLUE 0.5 % INJ SOLN
INTRAVENOUS | Status: AC
  Filled 2018-02-08: qty 10

## 2018-02-08 MED ORDER — ONDANSETRON HCL 4 MG/2ML IJ SOLN: 4.0000 mg | Freq: Once | INTRAMUSCULAR | Status: DC | PRN

## 2018-02-08 MED ORDER — EPHEDRINE 5 MG/ML INJ
INTRAVENOUS | Status: AC
  Filled 2018-02-08: qty 10

## 2018-02-08 MED ORDER — PROPOFOL 10 MG/ML IV BOLUS
INTRAVENOUS | Status: AC
  Filled 2018-02-08: qty 20

## 2018-02-08 MED ORDER — ONDANSETRON HCL 4 MG/2ML IJ SOLN
INTRAMUSCULAR | Status: AC
  Filled 2018-02-08: qty 2

## 2018-02-08 MED ORDER — LIDOCAINE 2% (20 MG/ML) 5 ML SYRINGE
INTRAMUSCULAR | Status: AC
  Filled 2018-02-08: qty 5

## 2018-02-08 MED ORDER — ESMOLOL HCL 100 MG/10ML IV SOLN
INTRAVENOUS | Status: DC | PRN
  Administered 2018-02-08 (×2): 25 mg via INTRAVENOUS

## 2018-02-08 MED ORDER — LABETALOL HCL 5 MG/ML IV SOLN
INTRAVENOUS | Status: DC | PRN
  Administered 2018-02-08: 5 mg via INTRAVENOUS

## 2018-02-08 SURGICAL SUPPLY — 51 items
BENZOIN TINCTURE PRP APPL 2/3 (GAUZE/BANDAGES/DRESSINGS) ×3 IMPLANT
BLADE SURG 10 STRL SS (BLADE) ×3 IMPLANT
CANISTER SUCT 3000ML PPV (MISCELLANEOUS) ×3 IMPLANT
CHLORAPREP W/TINT 26ML (MISCELLANEOUS) ×3 IMPLANT
CLIP VESOCCLUDE MED 6/CT (CLIP) ×6 IMPLANT
COVER SURGICAL LIGHT HANDLE (MISCELLANEOUS) ×3 IMPLANT
COVER WAND RF STERILE (DRAPES) ×3 IMPLANT
DERMABOND ADVANCED (GAUZE/BANDAGES/DRESSINGS) ×1
DERMABOND ADVANCED .7 DNX12 (GAUZE/BANDAGES/DRESSINGS) ×2 IMPLANT
DRAPE LAPAROSCOPIC ABDOMINAL (DRAPES) ×3 IMPLANT
DRAPE LAPAROTOMY 100X72 PEDS (DRAPES) ×3 IMPLANT
DRAPE UTILITY XL STRL (DRAPES) ×3 IMPLANT
DRSG TEGADERM 4X4.75 (GAUZE/BANDAGES/DRESSINGS) ×3 IMPLANT
ELECT CAUTERY BLADE 6.4 (BLADE) ×3 IMPLANT
ELECT REM PT RETURN 9FT ADLT (ELECTROSURGICAL) ×3
ELECTRODE REM PT RTRN 9FT ADLT (ELECTROSURGICAL) ×2 IMPLANT
GAUZE SPONGE 4X4 12PLY STRL (GAUZE/BANDAGES/DRESSINGS) ×3 IMPLANT
GLOVE BIO SURGEON STRL SZ 6 (GLOVE) ×3 IMPLANT
GLOVE BIOGEL PI IND STRL 6.5 (GLOVE) ×2 IMPLANT
GLOVE BIOGEL PI INDICATOR 6.5 (GLOVE) ×1
GLOVE INDICATOR 6.5 STRL GRN (GLOVE) ×3 IMPLANT
GLOVE SURG SS PI 6.0 STRL IVOR (GLOVE) ×3 IMPLANT
GLOVE SURG SS PI 6.5 STRL IVOR (GLOVE) ×3 IMPLANT
GOWN STRL REUS W/ TWL LRG LVL3 (GOWN DISPOSABLE) ×2 IMPLANT
GOWN STRL REUS W/TWL 2XL LVL3 (GOWN DISPOSABLE) ×3 IMPLANT
GOWN STRL REUS W/TWL LRG LVL3 (GOWN DISPOSABLE) ×1
KIT BASIN OR (CUSTOM PROCEDURE TRAY) ×3 IMPLANT
KIT TURNOVER KIT B (KITS) ×3 IMPLANT
MARKER SKIN DUAL TIP RULER LAB (MISCELLANEOUS) ×3 IMPLANT
NEEDLE FILTER BLUNT 18X 1/2SAF (NEEDLE) ×1
NEEDLE FILTER BLUNT 18X1 1/2 (NEEDLE) ×2 IMPLANT
NEEDLE HYPO 25GX1X1/2 BEV (NEEDLE) ×6 IMPLANT
NS IRRIG 1000ML POUR BTL (IV SOLUTION) ×3 IMPLANT
PACK SURGICAL SETUP 50X90 (CUSTOM PROCEDURE TRAY) ×3 IMPLANT
PAD ARMBOARD 7.5X6 YLW CONV (MISCELLANEOUS) ×9 IMPLANT
PENCIL BUTTON HOLSTER BLD 10FT (ELECTRODE) ×3 IMPLANT
SPECIMEN JAR SMALL (MISCELLANEOUS) ×3 IMPLANT
SPONGE LAP 18X18 X RAY DECT (DISPOSABLE) ×3 IMPLANT
STRIP CLOSURE SKIN 1/2X4 (GAUZE/BANDAGES/DRESSINGS) ×3 IMPLANT
SUT ETHILON 2 0 FS 18 (SUTURE) ×3 IMPLANT
SUT ETHILON 3 0 FSL (SUTURE) ×3 IMPLANT
SUT MON AB 4-0 PC3 18 (SUTURE) ×6 IMPLANT
SUT SILK 2 0 PERMA HAND 18 BK (SUTURE) ×3 IMPLANT
SUT VIC AB 2-0 SH 27 (SUTURE) ×2
SUT VIC AB 2-0 SH 27XBRD (SUTURE) ×4 IMPLANT
SUT VIC AB 3-0 SH 27 (SUTURE) ×2
SUT VIC AB 3-0 SH 27X BRD (SUTURE) ×4 IMPLANT
SYR BULB 3OZ (MISCELLANEOUS) ×3 IMPLANT
SYR CONTROL 10ML LL (SYRINGE) ×6 IMPLANT
TUBE CONNECTING 12X1/4 (SUCTIONS) ×3 IMPLANT
YANKAUER SUCT BULB TIP NO VENT (SUCTIONS) ×3 IMPLANT

## 2018-02-08 NOTE — Anesthesia Procedure Notes (Signed)
Procedure Name: Intubation Date/Time: 02/08/2018 3:39 PM Performed by: Imagene Riches, CRNA Pre-anesthesia Checklist: Patient identified, Emergency Drugs available, Suction available and Patient being monitored Patient Re-evaluated:Patient Re-evaluated prior to induction Oxygen Delivery Method: Circle System Utilized Preoxygenation: Pre-oxygenation with 100% oxygen Induction Type: IV induction Ventilation: Mask ventilation without difficulty Laryngoscope Size: Miller and 3 Grade View: Grade I Tube type: Oral Tube size: 7.0 mm Number of attempts: 1 Airway Equipment and Method: Stylet and Oral airway Placement Confirmation: ETT inserted through vocal cords under direct vision,  positive ETCO2 and breath sounds checked- equal and bilateral Secured at: 21 cm Tube secured with: Tape Dental Injury: Teeth and Oropharynx as per pre-operative assessment

## 2018-02-08 NOTE — Interval H&P Note (Signed)
History and Physical Interval Note:  02/08/2018 2:16 PM  Gabrielle Robinson  has presented today for surgery, with the diagnosis of Left hip melanoma  The various methods of treatment have been discussed with the patient and family. After consideration of risks, benefits and other options for treatment, the patient has consented to  Procedure(s): WIDE LOCAL EXCISION WITH ADVANCEMENT FLAP CLOSURE OF LEFT HIP MELANOMA ERAS PATHWAY (Left) SENTINEL LYMPH NODE BIOPSY (N/A) as a surgical intervention .  The patient's history has been reviewed, patient examined, no change in status, stable for surgery.  I have reviewed the patient's chart and labs.  Questions were answered to the patient's satisfaction.     [name]

## 2018-02-08 NOTE — Transfer of Care (Addendum)
Immediate Anesthesia Transfer of Care Note  Patient: Gabrielle Robinson  Procedure(s) Performed: WIDE LOCAL EXCISION WITH ADVANCEMENT FLAP CLOSURE OF LEFT HIP MELANOMA ERAS PATHWAY (Left Hip) LEFT INGUINAL SENTINEL LYMPH NODE BIOPSY (Left Leg Upper)  Patient Location: PACU  Anesthesia Type:General  Level of Consciousness: awake, alert  and oriented  Airway & Oxygen Therapy: Patient Spontanous Breathing and Patient connected to face mask  Post-op Assessment: Report given to RN and Post -op Vital signs reviewed and stable  Post vital signs: Reviewed and stable  Last Vitals:  Vitals Value Taken Time  BP 186/87 02/08/2018  5:15 PM  Temp 36.6 C 02/08/2018  5:15 PM  Pulse 66 02/08/2018  5:21 PM  Resp 12 02/08/2018  5:21 PM  SpO2 97 % 02/08/2018  5:21 PM  Vitals shown include unvalidated device data.  Last Pain:  Vitals:   02/08/18 1715  TempSrc:   PainSc: (P) 0-No pain      Patients Stated Pain Goal: 2 (82/95/62 1308)  Complications: No apparent anesthesia complications

## 2018-02-08 NOTE — Discharge Instructions (Addendum)
[facility] °Office Phone Number [phone] ° ° POST OP INSTRUCTIONS ° °Always review your discharge instruction sheet given to you by the facility where your surgery was performed. ° °IF YOU HAVE DISABILITY OR FAMILY LEAVE FORMS, YOU MUST BRING THEM TO THE OFFICE FOR PROCESSING.  DO NOT GIVE THEM TO YOUR DOCTOR. ° °1. A prescription for pain medication may be given to you upon discharge.  Take your pain medication as prescribed, if needed.  If narcotic pain medicine is not needed, then you may take acetaminophen (Tylenol) or ibuprofen (Advil) as needed. °2. Take your usually prescribed medications unless otherwise directed °3. If you need a refill on your pain medication, please contact your pharmacy.  They will contact our office to request authorization.  Prescriptions will not be filled after 5pm or on week-ends. °4. You should eat very light the first 24 hours after surgery, such as soup, crackers, pudding, etc.  Resume your normal diet the day after surgery °5. It is common to experience some constipation if taking pain medication after surgery.  Increasing fluid intake and taking a stool softener will usually help or prevent this problem from occurring.  A mild laxative (Milk of Magnesia or Miralax) should be taken according to package directions if there are no bowel movements after 48 hours. °6. You may shower in 48 hours.  The surgical glue will flake off in 2-3 weeks.   °7. ACTIVITIES:  No strenuous activity or heavy lifting for 1 week.   °a. You may drive when you no longer are taking prescription pain medication, you can comfortably wear a seatbelt, and you can safely maneuver your car and apply brakes. °b. RETURN TO WORK:  __________1 week if applicable_______________ °You should see your doctor in the office for a follow-up appointment approximately three-four weeks after your surgery.   ° °WHEN TO CALL YOUR DOCTOR: °1. Fever over 101.0 °2. Nausea and/or vomiting. °3. Extreme  swelling or bruising. °4. Continued bleeding from incision. °5. Increased pain, redness, or drainage from the incision. ° °The clinic staff is available to answer your questions during regular business hours.  Please don’t hesitate to call and ask to speak to one of the nurses for clinical concerns.  If you have a medical emergency, go to the nearest emergency room or call 911.  A surgeon from [facility] is always on call at the hospital. ° °For further questions, please visit [email or web address]  ° °

## 2018-02-08 NOTE — Anesthesia Preprocedure Evaluation (Addendum)
Anesthesia Evaluation  Patient identified by MRN, date of birth, ID band Patient awake    Reviewed: Allergy & Precautions, NPO status , Patient's Chart, lab work & pertinent test results  History of Anesthesia Complications Negative for: history of anesthetic complications  Airway Mallampati: II  TM Distance: >3 FB Neck ROM: Full    Dental no notable dental hx.    Pulmonary neg pulmonary ROS,    Pulmonary exam normal        Cardiovascular negative cardio ROS Normal cardiovascular exam     Neuro/Psych PSYCHIATRIC DISORDERS Depression negative neurological ROS     GI/Hepatic negative GI ROS, Neg liver ROS,   Endo/Other  diabetes, Well Controlled, Oral Hypoglycemic Agents  Renal/GU negative Renal ROS  negative genitourinary   Musculoskeletal negative musculoskeletal ROS (+)   Abdominal   Peds  Hematology negative hematology ROS (+)   Anesthesia Other Findings   Reproductive/Obstetrics                            Anesthesia Physical Anesthesia Plan  ASA: II  Anesthesia Plan: General   Post-op Pain Management:    Induction: Intravenous  PONV Risk Score and Plan: 3 and Ondansetron, Dexamethasone and Treatment may vary due to age or medical condition  Airway Management Planned: Oral ETT  Additional Equipment: None  Intra-op Plan:   Post-operative Plan: Extubation in OR  Informed Consent: I have reviewed the patients History and Physical, chart, labs and discussed the procedure including the risks, benefits and alternatives for the proposed anesthesia with the patient or authorized representative who has indicated his/her understanding and acceptance.     Plan Discussed with:   Anesthesia Plan Comments: (LTA prior to intubation)       Anesthesia Quick Evaluation

## 2018-02-08 NOTE — Op Note (Signed)
PRE-OPERATIVE DIAGNOSIS: cT2aN0 left hip melanoma  POST-OPERATIVE DIAGNOSIS:  Same  PROCEDURE:  Procedure(s): Wide local excision 2 cm margins, advancement flap closure for defect 10.2 cm x 4.8 cm, left inguinal sentinel lymph node mapping and biopsy  SURGEON:  Surgeon(s): [name], MD  ANESTHESIA:   local and general  DRAINS: none   LOCAL MEDICATIONS USED:  MARCAINE    and XYLOCAINE   SPECIMEN:  Source of Specimen:  two inguinal sentinel lymph nodes, wide local excision left hip.     FINDINGS:  SLN cps highest node 490, numerous pigmented lesions on skin around melanoma biopsy site.   DISPOSITION OF SPECIMEN:  PATHOLOGY  COUNTS:  YES  PLAN OF CARE: Discharge to home after PACU  PATIENT DISPOSITION:  PACU - hemodynamically stable.    PROCEDURE:   Pt was identified in the holding area, taken to the OR, and placed supine on the OR table.  General anesthesia was induced.  Time out was performed according to the surgical safety checklist.  When all was correct, we continued.  Two mL methylene blue was injected intradermally around the melanoma biopsy site.    The patient was placed into a bumped position with the right side up.   The upper back was prepped and draped in sterile fashion.  The melanoma was identified and 2 cm margins were marked out.  This was turned into an ellipse.  Local was administered under the melanoma and the adjacent tissue.  A #10 blade was used to incise the skin around the melanoma.  The cautery was used to take the dissection down to the fascia.  The skin was marked in situ with silk suture.  The cautery was used to take the specimen off the fascia, and it was passed off the table.    Skin hooks were used to elevate the edges of the incision and the skin was freed up in all directions to create flaps.  This was pulled together in an longitudinal orientation.  The skin was pulled together.   Deep interrupted 2-0 vicryl sutures were placed to relieve  tension.  The skin was then reapproximated with 3-0 interrupted vicryl deep dermal sutures and 4-0 monocryl running subcuticular sutures.  Four 2-0 nylon horizontal mattress sutures were placed as well.  The wound was dressed with Benzoin, steristrips, gauze, and tegaderm.    The patient's left groin was prepped and draped in sterile fashion.  The point of maximum signal intensity was identified with the neoprobe.  A 3 cm incision was made with a #15 blade.  The subcutaneous tissues were divided with the cautery.  A Weitlaner retractor was used to assist with visualization.  The tonsil clamp was used to bluntly dissect the axillary fat pad.  Two deep inguinal sentinel lymph nodes were identified as described above.  The lymphovascular channels were clipped with hemoclips.  The nodes were passed off as specimens.  Hemostasis was achieved with the cautery.  The axilla was irrigated and closed with 3-0 Vicryl deep dermal interrupted sutures and 4-0 Monocryl running subcuticular suture.  This was cleaned, dried, and dressed with Benzoin, steristrips, gauze, and tegaderm.  Counts were correct.  Needle, sponge, and instrument counts were correct.  The patient was awakened from anesthesia and taken to the PACU in stable condition.

## 2018-02-09 ENCOUNTER — Encounter (HOSPITAL_COMMUNITY): Payer: Self-pay | Admitting: General Surgery

## 2018-02-09 NOTE — Anesthesia Postprocedure Evaluation (Signed)
Anesthesia Post Note  Patient: LEONTINA SKIDMORE  Procedure(s) Performed: WIDE LOCAL EXCISION WITH ADVANCEMENT FLAP CLOSURE OF LEFT HIP MELANOMA ERAS PATHWAY (Left Hip) LEFT INGUINAL SENTINEL LYMPH NODE BIOPSY (Left Leg Upper)     Patient location during evaluation: PACU Anesthesia Type: General Level of consciousness: awake and alert Pain management: pain level controlled Vital Signs Assessment: post-procedure vital signs reviewed and stable Respiratory status: spontaneous breathing, nonlabored ventilation and respiratory function stable Cardiovascular status: blood pressure returned to baseline and stable Postop Assessment: no apparent nausea or vomiting Anesthetic complications: no    Last Vitals:  Vitals:   02/08/18 1750 02/08/18 1800  BP: (!) 185/84 (!) 182/81  Pulse: 66 66  Resp: 15 16  Temp:    SpO2: 99% 94%    Last Pain:  Vitals:   02/08/18 1800  TempSrc:   PainSc: 0-No pain                 Audry Pili

## 2018-02-14 ENCOUNTER — Telehealth: Payer: Self-pay | Admitting: General Surgery

## 2018-02-14 NOTE — Telephone Encounter (Signed)
Discussed pathology with patient and husband. Will refer to oncology and get PET scan.

## 2018-02-14 NOTE — Progress Notes (Signed)
I reviewed pathology with patient and husband.  She will need whole body PET scan and referral to oncology after PET (Dr. Alen Blew).  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  I told her to expect call for appts.

## 2018-02-19 ENCOUNTER — Other Ambulatory Visit (HOSPITAL_COMMUNITY): Payer: Self-pay | Admitting: General Surgery

## 2018-02-19 DIAGNOSIS — C779 Secondary and unspecified malignant neoplasm of lymph node, unspecified: Secondary | ICD-10-CM

## 2018-03-12 ENCOUNTER — Telehealth: Payer: Self-pay | Admitting: Oncology

## 2018-03-12 ENCOUNTER — Encounter: Payer: Self-pay | Admitting: Oncology

## 2018-03-12 NOTE — Telephone Encounter (Signed)
New referral received from Dr. Barry Dienes for dx of melanoma. Pt has been cld and scheduled to see Dr. Alen Blew on 1/15 at 11am. Pt aware to arrive 30 minutes early. Letter mailed.

## 2018-03-13 ENCOUNTER — Other Ambulatory Visit: Payer: Self-pay | Admitting: Family Medicine

## 2018-04-06 NOTE — Progress Notes (Deleted)
HPI:  Using dictation device. Unfortunately this device frequently misinterprets words/phrases.  Gabrielle Robinson is a pleasant 78 y.o. here for follow up. Chronic medical problems summarized below were reviewed for changes and stability and were updated as needed below. These issues and their treatment remain stable for the most part. ***. Denies CP, SOB, DOE, treatment intolerance or new symptoms. Due for diabetic eye exam, due for labs next visit.  Melanoma: -referred her urgently to dermatologist in 11/2017 for new suspicious skin lesion, unfortunately dx of melanoma -she had surgery in early November 2019 -she is seeing oncology and dermatology for management  Depression: -chronic, initially presented to me 02/2014 -she opted for low dose lexapro and advised CBT - wishes to continue the lexapro  DM: -well controlled on last check -meds: metformin 500mg  bid  HLD: -increased pravastatin 10/2015 -denies: hx statin intol, leg cramps, cog changes   ROS: See pertinent positives and negatives per HPI.  Past Medical History:  Diagnosis Date  . Cancer ([place])    Left Hip Melanoma  . Diabetes mellitus without complication ([place])   . Hyperlipemia 01/05/2009   Qualifier: Diagnosis of  By: Arnoldo Morale MD, Balinda Quails   . Major depressive disorder with single episode, in full remission ([place]) 05/28/2015    Past Surgical History:  Procedure Laterality Date  . MELANOMA EXCISION Left 02/08/2018   Procedure: WIDE LOCAL EXCISION WITH ADVANCEMENT FLAP CLOSURE OF LEFT HIP MELANOMA ERAS PATHWAY;  Surgeon: [name], MD;  Location: [facility];  Service: General;  Laterality: Left;  . SENTINEL NODE BIOPSY Left 02/08/2018   Procedure: LEFT INGUINAL SENTINEL LYMPH NODE BIOPSY;  Surgeon: [name], MD;  Location: [facility];  Service: General;  Laterality: Left;    No family history on file.  SOCIAL HX: ***   Current Outpatient Medications:  .  Biotin 5 MG TABS, Take 5 mg by mouth daily., Disp: , Rfl:  .   Calcium Acetate, Phos Binder, (CALCIUM ACETATE PO), Take 1 tablet by mouth daily., Disp: , Rfl:  .  diphenhydrAMINE (BENADRYL) 25 MG tablet, Take 12.5 mg by mouth at bedtime., Disp: , Rfl:  .  escitalopram (LEXAPRO) 5 MG tablet, TAKE ONE TABLET BY MOUTH ONE TIME DAILY, Disp: 90 tablet, Rfl: 1 .  metFORMIN (GLUCOPHAGE) 500 MG tablet, TAKE 1 TABLET BY MOUTH TWICE A DAY WITH MEALS, Disp: 180 tablet, Rfl: 1 .  Multiple Vitamin (MULTIVITAMIN WITH MINERALS) TABS tablet, Take 1 tablet by mouth daily., Disp: , Rfl:  .  oxyCODONE (OXY IR/ROXICODONE) 5 MG immediate release tablet, Take 1 tablet (5 mg total) by mouth every 6 (six) hours as needed for severe pain., Disp: 15 tablet, Rfl: 0 .  Polyethyl Glycol-Propyl Glycol (SYSTANE ULTRA OP), Place 1 drop into both eyes daily as needed (dry eyes)., Disp: , Rfl:  .  pravastatin (PRAVACHOL) 40 MG tablet, TAKE ONE TABLET BY MOUTH ONE TIME DAILY, Disp: 90 tablet, Rfl: 1  EXAM:  There were no vitals filed for this visit.  There is no height or weight on file to calculate BMI.  GENERAL: vitals reviewed and listed above, alert, oriented, appears well hydrated and in no acute distress  HEENT: atraumatic, conjunttiva clear, no obvious abnormalities on inspection of external nose and ears  NECK: no obvious masses on inspection  LUNGS: clear to auscultation bilaterally, no wheezes, rales or rhonchi, good air movement  CV: HRRR, no peripheral edema  MS: moves all extremities without noticeable abnormality *** PSYCH: pleasant and cooperative, no obvious depression  or anxiety  ASSESSMENT AND PLAN:  Discussed the following assessment and plan:  No diagnosis found.  *** -Patient advised to return or notify a doctor immediately if symptoms worsen or persist or new concerns arise.  There are no Patient Instructions on file for this visit.  Lucretia Kern, DO

## 2018-04-09 ENCOUNTER — Ambulatory Visit: Payer: Medicare Other | Admitting: Family Medicine

## 2018-04-10 ENCOUNTER — Encounter (HOSPITAL_COMMUNITY): Payer: Self-pay

## 2018-04-10 ENCOUNTER — Ambulatory Visit (HOSPITAL_COMMUNITY): Payer: Medicare Other

## 2018-04-11 ENCOUNTER — Inpatient Hospital Stay: Payer: Medicare Other | Attending: Oncology | Admitting: Oncology

## 2018-04-11 ENCOUNTER — Telehealth: Payer: Self-pay | Admitting: Oncology

## 2018-04-11 VITALS — BP 142/73 | HR 80 | Temp 98.2°F | Resp 18 | Ht 60.0 in | Wt 118.2 lb

## 2018-04-11 DIAGNOSIS — Z79899 Other long term (current) drug therapy: Secondary | ICD-10-CM | POA: Diagnosis not present

## 2018-04-11 DIAGNOSIS — C4372 Malignant melanoma of left lower limb, including hip: Secondary | ICD-10-CM | POA: Diagnosis not present

## 2018-04-11 DIAGNOSIS — C439 Malignant melanoma of skin, unspecified: Secondary | ICD-10-CM

## 2018-04-11 NOTE — Telephone Encounter (Signed)
Printed calendar and avs. °

## 2018-04-11 NOTE — Progress Notes (Signed)
Reason for the request: Melanoma     HPI: I was asked by Dr. Barry Dienes to evaluate Gabrielle Robinson for the diagnosis of melanoma.  She is a 78 year old woman with history of diabetes was evaluated by Dr. Ronnald Ramp for left posterior thigh lesion.  A biopsy obtained at that time which showed melanoma and subsequently referred to Dr. Barry Dienes for evaluation.  She underwent wide local excision with 2 cm margin and sentinel lymph node biopsy of the left inguinal node on February 08, 2018.  She tolerated the procedure well without any complications.  The final pathology showed a 1.5 mm maximum thickness without any ulceration or lymphovascular invasion.  Tumor infiltrating lymphocyte was present and brisk.  1 out of 2 inguinal lymph node showed malignancy.  There is noted less than 0.1 mm aggregate noted without any extracapsular extension.  Clinically, she reports no complaints at this time.  She is fully recovered without any new rashes or lesions.  She denies any constitutional symptoms.  PET scan remains pending.  She does not report any headaches, blurry vision, syncope or seizures. Does not report any fevers, chills or sweats.  Does not report any cough, wheezing or hemoptysis.  Does not report any chest pain, palpitation, orthopnea or leg edema.  Does not report any nausea, vomiting or abdominal pain.  Does not report any constipation or diarrhea.  Does not report any skeletal complaints.    Does not report frequency, urgency or hematuria.  Does not report any skin rashes or lesions. Does not report any heat or cold intolerance.  Does not report any lymphadenopathy or petechiae.  Does not report any anxiety or depression.  Remaining review of systems is negative.    Past Medical History:  Diagnosis Date  . Cancer ([place])    Left Hip Melanoma  . Diabetes mellitus without complication ([place])   . Hyperlipemia 01/05/2009   Qualifier: Diagnosis of  By: Arnoldo Morale MD, Balinda Quails   . Major depressive disorder with single episode,  in full remission ([place]) 05/28/2015  :  Past Surgical History:  Procedure Laterality Date  . MELANOMA EXCISION Left 02/08/2018   Procedure: WIDE LOCAL EXCISION WITH ADVANCEMENT FLAP CLOSURE OF LEFT HIP MELANOMA ERAS PATHWAY;  Surgeon: [name], MD;  Location: [facility];  Service: General;  Laterality: Left;  . SENTINEL NODE BIOPSY Left 02/08/2018   Procedure: LEFT INGUINAL SENTINEL LYMPH NODE BIOPSY;  Surgeon: [name], MD;  Location: [facility];  Service: General;  Laterality: Left;  :   Current Outpatient Medications:  .  Biotin 5 MG TABS, Take 5 mg by mouth daily., Disp: , Rfl:  .  Calcium Acetate, Phos Binder, (CALCIUM ACETATE PO), Take 1 tablet by mouth daily., Disp: , Rfl:  .  diphenhydrAMINE (BENADRYL) 25 MG tablet, Take 12.5 mg by mouth at bedtime., Disp: , Rfl:  .  escitalopram (LEXAPRO) 5 MG tablet, TAKE ONE TABLET BY MOUTH ONE TIME DAILY, Disp: 90 tablet, Rfl: 1 .  metFORMIN (GLUCOPHAGE) 500 MG tablet, TAKE 1 TABLET BY MOUTH TWICE A DAY WITH MEALS, Disp: 180 tablet, Rfl: 1 .  Multiple Vitamin (MULTIVITAMIN WITH MINERALS) TABS tablet, Take 1 tablet by mouth daily., Disp: , Rfl:  .  oxyCODONE (OXY IR/ROXICODONE) 5 MG immediate release tablet, Take 1 tablet (5 mg total) by mouth every 6 (six) hours as needed for severe pain., Disp: 15 tablet, Rfl: 0 .  Polyethyl Glycol-Propyl Glycol (SYSTANE ULTRA OP), Place 1 drop into both eyes daily as needed (dry eyes)., Disp: ,  Rfl:  .  pravastatin (PRAVACHOL) 40 MG tablet, TAKE ONE TABLET BY MOUTH ONE TIME DAILY, Disp: 90 tablet, Rfl: 1:  No Known Allergies:  No family history on file.:  Social History   Socioeconomic History  . Marital status: Married    Spouse name: Not on file  . Number of children: Not on file  . Years of education: Not on file  . Highest education level: Not on file  Occupational History  . Not on file  Social Needs  . Financial resource strain: Not on file  . Food insecurity:    Worry: Not on file     Inability: Not on file  . Transportation needs:    Medical: Not on file    Non-medical: Not on file  Tobacco Use  . Smoking status: Never Smoker  . Smokeless tobacco: Never Used  Substance and Sexual Activity  . Alcohol use: Yes    Alcohol/week: 21.0 standard drinks    Types: 21 Glasses of wine per week    Comment: occ  . Drug use: No  . Sexual activity: Not on file  Lifestyle  . Physical activity:    Days per week: Not on file    Minutes per session: Not on file  . Stress: Not on file  Relationships  . Social connections:    Talks on phone: Not on file    Gets together: Not on file    Attends religious service: Not on file    Active member of club or organization: Not on file    Attends meetings of clubs or organizations: Not on file    Relationship status: Not on file  . Intimate partner violence:    Fear of current or ex partner: Not on file    Emotionally abused: Not on file    Physically abused: Not on file    Forced sexual activity: Not on file  Other Topics Concern  . 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  Exam: Blood pressure (!) 142/73, pulse 80, temperature 98.2 F (36.8 C), temperature source Oral, resp. rate 18, height 5' (1.524 m), weight 118 lb 3.2 oz (53.6 kg), SpO2 99 %.  ECOG 0 General appearance: alert and cooperative appeared without distress. Head: atraumatic without any abnormalities. Eyes: conjunctivae/corneas clear. PERRL.  Sclera anicteric. Throat: lips, mucosa, and tongue normal; without oral thrush or ulcers. Resp: clear to auscultation bilaterally without rhonchi, wheezes or dullness to percussion. Cardio: regular rate and rhythm, S1, S2 normal, no murmur, click, rub or gallop GI: soft,  non-tender; bowel sounds normal; no masses,  no organomegaly Skin: Skin color, texture, turgor normal. No rashes or lesions Lymph nodes: Cervical, supraclavicular, and axillary nodes normal. Neurologic: Grossly normal without any motor, sensory or deep tendon reflexes. Musculoskeletal: No joint deformity or effusion.  CBC    Component Value Date/Time   WBC 5.8 01/30/2018 1044   RBC 4.47 01/30/2018 1044   HGB 13.1 01/30/2018 1044   HCT 41.0 01/30/2018 1044   PLT 250 01/30/2018 1044   MCV 91.7 01/30/2018 1044   MCH 29.3 01/30/2018 1044   MCHC 32.0 01/30/2018 1044   RDW 12.2 01/30/2018 1044  LYMPHSABS 1.8 01/30/2018 1044   MONOABS 0.8 01/30/2018 1044   EOSABS 0.3 01/30/2018 1044   BASOSABS 0.1 01/30/2018 1044     Chemistry      Component Value Date/Time   NA 140 01/30/2018 1044   K 4.4 01/30/2018 1044   CL 104 01/30/2018 1044   CO2 28 01/30/2018 1044   BUN 13 01/30/2018 1044   CREATININE 0.79 01/30/2018 1044      Component Value Date/Time   CALCIUM 9.5 01/30/2018 1044   ALKPHOS 62 01/30/2018 1044   AST 22 01/30/2018 1044   ALT 19 01/30/2018 1044   BILITOT 0.6 01/30/2018 1044       Assessment and Plan:    78 year old woman with the following:  1.  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  She underwent wide excision and sentinel lymph node sampling of her left inguinal area.  The final pathology showed 1.5 mm with less than 0.1 mm aggregate in 1 out of 2 lymph nodes indicating stage III disease.  The natural course of this disease and treatment options were reviewed today with the patient.  After completing her staging scans and to rule out stage IV disease we final recommendation will be given.  Assuming she has no evidence of advanced disease, the rationale for using adjuvant immunotherapy was discussed today in detail.  Most of a clinical trial did not include patients with very little aggregates in one lymph node.  The benefit remains  questionable in this particular setting and the complication associated with immunotherapy were reviewed today.  After discussion today, given the very minimal residual involvement in her lymph node, I have opted to proceed with active surveillance rather than adjuvant immunotherapy.  If her PET scan does show advanced disease, the rationale for using systemic therapy was discussed today.  These options would include single agent immunotherapy, combined immunotherapy will be BRAFtargeted therapy if her disease exhibit this mutation.  I do not anticipate that she has metastatic disease but is critical to rule that out.  2.  Dermatological surveillance: Is critical to continue to follow with dermatology and we have discussed strategies to protect her skin moving forward.  That include adequate sunblock as well as long sleeves and hats.  3.  Follow-up: We will be in the next few weeks after obtaining PET CT scan.  This has been ordered by Dr. Barry Dienes and has not been completed yet.  60  minutes was spent with the patient face-to-face today.  More than 50% of time was dedicated to reviewing her pathology reports, laboratory data, the natural course of her disease and treatment options.    Thank you for the referral. A copy of this consult has been forwarded to the requesting physician.

## 2018-04-16 NOTE — Progress Notes (Signed)
HPI:  Using dictation device. Unfortunately this device frequently misinterprets words/phrases.  Gabrielle Robinson is a pleasant 78 y.o. here for follow up. Chronic medical problems summarized below were reviewed for changes and stability and were updated as needed below. These issues and their treatment remain stable for the most part.  Seeing dermatology for management new dx of melanoma.  Also seeing oncology.  It sounds like she may undergo chemotherapy.  She is quite anxious about this.  Otherwise is doing well. Denies CP, SOB, DOE, treatment intolerance or new symptoms. Due for eye exam  Depression: -chronic, initially presented to me 02/2014 -she opted for low dose lexapro and advised CBT - wishes to continue the lexapro  DM: -well controlled on last check -meds: metformin 500mg  bid  HLD: -increased pravastatin 10/2015 -denies: hx statin intol, leg cramps, cog changes  New Dx Melanoma of skin: -dx in 2019 -seeing dermatology and oncology for managment  ROS: See pertinent positives and negatives per HPI.  Past Medical History:  Diagnosis Date  . Cancer ([place])    Left Hip Melanoma  . Diabetes mellitus without complication ([place])   . Hyperlipemia 01/05/2009   Qualifier: Diagnosis of  By: Arnoldo Morale MD, Balinda Quails   . Major depressive disorder with single episode, in full remission ([place]) 05/28/2015    Past Surgical History:  Procedure Laterality Date  . MELANOMA EXCISION Left 02/08/2018   Procedure: WIDE LOCAL EXCISION WITH ADVANCEMENT FLAP CLOSURE OF LEFT HIP MELANOMA ERAS PATHWAY;  Surgeon: [name], MD;  Location: [facility];  Service: General;  Laterality: Left;  . SENTINEL NODE BIOPSY Left 02/08/2018   Procedure: LEFT INGUINAL SENTINEL LYMPH NODE BIOPSY;  Surgeon: [name], MD;  Location: [facility];  Service: General;  Laterality: Left;    History reviewed. No pertinent family history.  SOCIAL HX: See HPI   Current Outpatient Medications:  .  Biotin 5 MG TABS, Take 5 mg  by mouth daily., Disp: , Rfl:  .  Calcium Acetate, Phos Binder, (CALCIUM ACETATE PO), Take 1 tablet by mouth daily., Disp: , Rfl:  .  diphenhydrAMINE (BENADRYL) 25 MG tablet, Take 12.5 mg by mouth at bedtime., Disp: , Rfl:  .  escitalopram (LEXAPRO) 5 MG tablet, TAKE ONE TABLET BY MOUTH ONE TIME DAILY, Disp: 90 tablet, Rfl: 1 .  metFORMIN (GLUCOPHAGE) 500 MG tablet, TAKE 1 TABLET BY MOUTH TWICE A DAY WITH MEALS, Disp: 180 tablet, Rfl: 1 .  Multiple Vitamin (MULTIVITAMIN WITH MINERALS) TABS tablet, Take 1 tablet by mouth daily., Disp: , Rfl:  .  oxyCODONE (OXY IR/ROXICODONE) 5 MG immediate release tablet, Take 1 tablet (5 mg total) by mouth every 6 (six) hours as needed for severe pain., Disp: 15 tablet, Rfl: 0 .  Polyethyl Glycol-Propyl Glycol (SYSTANE ULTRA OP), Place 1 drop into both eyes daily as needed (dry eyes)., Disp: , Rfl:  .  pravastatin (PRAVACHOL) 40 MG tablet, TAKE ONE TABLET BY MOUTH ONE TIME DAILY, Disp: 90 tablet, Rfl: 1  EXAM:  Vitals:   04/17/18 1126  BP: 118/72  Pulse: 71  Temp: 97.7 F (36.5 C)    Body mass index is 23.51 kg/m.  GENERAL: vitals reviewed and listed above, alert, oriented, appears well hydrated and in no acute distress  HEENT: atraumatic, conjunttiva clear, no obvious abnormalities on inspection of external nose and ears  NECK: no obvious masses on inspection  LUNGS: clear to auscultation bilaterally, no wheezes, rales or rhonchi, good air movement  CV: HRRR, no peripheral edema  MS: moves all extremities without noticeable abnormality  PSYCH: pleasant and cooperative, no obvious depression or anxiety  ASSESSMENT AND PLAN:  Discussed the following assessment and plan:  Major depressive disorder with single episode, in full remission ([place])  Type 2 diabetes mellitus without complication, without long-term current use of insulin ([facility])  Hyperlipidemia, unspecified hyperlipidemia type  Malignant melanoma of lower extremity, including hip,  unspecified laterality ([place])  -Continue current treatment for diabetes, hyperlipidemia and depression -Coping okay despite new diagnosis of cancer, she is seeing oncologist for management, in good hands rightfully anxious about treatment options and this diagnosis -She plans to get her eye exam when she is through getting set up for treatment for the melanoma -Plans to follow-up here in about 4 to 5 months   Patient Instructions  BEFORE YOU LEAVE: -follow up: 4-5 months  Please schedule your eye exam when able.  Will be praying for you.    Lucretia Kern, DO

## 2018-04-17 ENCOUNTER — Encounter: Payer: Self-pay | Admitting: Family Medicine

## 2018-04-17 ENCOUNTER — Ambulatory Visit: Payer: Medicare Other | Admitting: Family Medicine

## 2018-04-17 VITALS — BP 118/72 | HR 71 | Temp 97.7°F | Ht 60.0 in | Wt 120.4 lb

## 2018-04-17 DIAGNOSIS — E785 Hyperlipidemia, unspecified: Secondary | ICD-10-CM

## 2018-04-17 DIAGNOSIS — C4370 Malignant melanoma of unspecified lower limb, including hip: Secondary | ICD-10-CM | POA: Diagnosis not present

## 2018-04-17 DIAGNOSIS — E119 Type 2 diabetes mellitus without complications: Secondary | ICD-10-CM

## 2018-04-17 DIAGNOSIS — F325 Major depressive disorder, single episode, in full remission: Secondary | ICD-10-CM

## 2018-04-17 NOTE — Patient Instructions (Signed)
BEFORE YOU LEAVE: -follow up: 4-5 months  Please schedule your eye exam when able.  Will be praying for you.

## 2018-04-25 ENCOUNTER — Encounter (HOSPITAL_COMMUNITY)
Admission: RE | Admit: 2018-04-25 | Discharge: 2018-04-25 | Disposition: A | Discharge status: Home / Self Care | Payer: Medicare Other | Source: Ambulatory Visit | Attending: General Surgery | Admitting: General Surgery

## 2018-04-25 DIAGNOSIS — C779 Secondary and unspecified malignant neoplasm of lymph node, unspecified: Secondary | ICD-10-CM | POA: Diagnosis present

## 2018-04-25 LAB — GLUCOSE, CAPILLARY: Glucose-Capillary: 141 mg/dL — ABNORMAL HIGH (ref 70–99)

## 2018-04-25 MED ORDER — FLUDEOXYGLUCOSE F - 18 (FDG) INJECTION
6.4300 | Freq: Once | INTRAVENOUS | Status: AC
  Administered 2018-04-25: 6.43 via INTRAVENOUS

## 2018-04-25 NOTE — Progress Notes (Signed)
Please let patient know PET is negative for any sign of metastatic disease.

## 2018-04-30 ENCOUNTER — Telehealth: Payer: Self-pay | Admitting: Oncology

## 2018-04-30 ENCOUNTER — Inpatient Hospital Stay: Payer: Medicare Other | Attending: Oncology | Admitting: Oncology

## 2018-04-30 VITALS — BP 138/75 | HR 76 | Temp 98.0°F | Resp 18 | Ht 60.0 in | Wt 119.8 lb

## 2018-04-30 DIAGNOSIS — Z7984 Long term (current) use of oral hypoglycemic drugs: Secondary | ICD-10-CM | POA: Insufficient documentation

## 2018-04-30 DIAGNOSIS — J329 Chronic sinusitis, unspecified: Secondary | ICD-10-CM

## 2018-04-30 DIAGNOSIS — I700 Atherosclerosis of aorta: Secondary | ICD-10-CM | POA: Diagnosis not present

## 2018-04-30 DIAGNOSIS — Z79899 Other long term (current) drug therapy: Secondary | ICD-10-CM

## 2018-04-30 DIAGNOSIS — C4372 Malignant melanoma of left lower limb, including hip: Secondary | ICD-10-CM | POA: Diagnosis present

## 2018-04-30 DIAGNOSIS — C439 Malignant melanoma of skin, unspecified: Secondary | ICD-10-CM

## 2018-04-30 NOTE — Progress Notes (Signed)
Hematology and Oncology Follow Up Visit  Gabrielle Robinson [id number] [birth year] 78 y.o. 04/30/2018 10:59 AM Gabrielle Fill, DO   Principle Diagnosis: 78 year old woman with a cutaneous melanoma diagnosed in November 2019.  She was found to have 1.5 mm depth of invasion on the left thigh with 1 out of 2 lymph node involvement with less than 0.1 mm aggregate.   Prior Therapy:  She is a status post wide excision and sentinel lymph node sampling completed and February 08, 2018.  Current therapy:  Active surveillance.  Interim History: Gabrielle Robinson presents today for a follow-up visit.  Since her last visit, she reports no major changes in her health.  She is fully recovered from her surgery without any residual issues.  She continues to be active and attends activities of daily living.  She denies any pelvic discomfort or lymphadenopathy.  Her performance status and quality of life is unchanged.  She does not report any headaches, blurry vision, syncope or seizures. Does not report any fevers, chills or sweats.  Does not report any cough, wheezing or hemoptysis.  Does not report any chest pain, palpitation, orthopnea or leg edema.  Does not report any nausea, vomiting or abdominal pain.  Does not report any constipation or diarrhea.  Does not report any skeletal complaints.    Does not report frequency, urgency or hematuria.  Does not report any skin rashes or lesions. Does not report any heat or cold intolerance.  Does not report any lymphadenopathy or petechiae.  Does not report any anxiety or depression.  Remaining review of systems is negative.    Medications: I have reviewed the patient's current medications.  Current Outpatient Medications  Medication Sig Dispense Refill  . Biotin 5 MG TABS Take 5 mg by mouth daily.    . Calcium Acetate, Phos Binder, (CALCIUM ACETATE PO) Take 1 tablet by mouth daily.    . diphenhydrAMINE (BENADRYL) 25 MG tablet Take 12.5 mg by mouth at bedtime.     Marland Kitchen escitalopram (LEXAPRO) 5 MG tablet TAKE ONE TABLET BY MOUTH ONE TIME DAILY 90 tablet 1  . metFORMIN (GLUCOPHAGE) 500 MG tablet TAKE 1 TABLET BY MOUTH TWICE A DAY WITH MEALS 180 tablet 1  . Multiple Vitamin (MULTIVITAMIN WITH MINERALS) TABS tablet Take 1 tablet by mouth daily.    Marland Kitchen oxyCODONE (OXY IR/ROXICODONE) 5 MG immediate release tablet Take 1 tablet (5 mg total) by mouth every 6 (six) hours as needed for severe pain. 15 tablet 0  . Polyethyl Glycol-Propyl Glycol (SYSTANE ULTRA OP) Place 1 drop into both eyes daily as needed (dry eyes).    . pravastatin (PRAVACHOL) 40 MG tablet TAKE ONE TABLET BY MOUTH ONE TIME DAILY 90 tablet 1   No current facility-administered medications for this visit.      Allergies: No Known Allergies  Past Medical History, Surgical history, Social history, and Family History were reviewed and updated.    Physical Exam: Blood pressure 138/75, pulse 76, temperature 98 F (36.7 C), temperature source Oral, resp. rate 18, height 5' (1.524 m), weight 119 lb 12.8 oz (54.3 kg), SpO2 99 %.   ECOG:    General appearance: alert and cooperative appeared without distress. Head: Normocephalic, without obvious abnormality Oropharynx: No oral thrush or ulcers. Eyes: No scleral icterus.  Pupils are equal and round reactive to light. Lymph nodes: Cervical, supraclavicular, and axillary nodes normal. Heart:regular rate and rhythm, S1, S2 normal, no murmur, click, rub or gallop Lung:chest clear, no wheezing,  rales, normal symmetric air entry Abdomin: soft, non-tender, without masses or organomegaly. Neurological: No motor, sensory deficits.  Intact deep tendon reflexes. Skin: No rashes or lesions.  No ecchymosis or petechiae. Musculoskeletal: No joint deformity or effusion. Psychiatric: Mood and affect are appropriate.    Lab Results: Lab Results  Component Value Date   WBC 5.8 01/30/2018   HGB 13.1 01/30/2018   HCT 41.0 01/30/2018   MCV 91.7 01/30/2018    PLT 250 01/30/2018     Chemistry      Component Value Date/Time   NA 140 01/30/2018 1044   K 4.4 01/30/2018 1044   CL 104 01/30/2018 1044   CO2 28 01/30/2018 1044   BUN 13 01/30/2018 1044   CREATININE 0.79 01/30/2018 1044      Component Value Date/Time   CALCIUM 9.5 01/30/2018 1044   ALKPHOS 62 01/30/2018 1044   AST 22 01/30/2018 1044   ALT 19 01/30/2018 1044   BILITOT 0.6 01/30/2018 1044       Radiological Studies:  EXAM: NUCLEAR MEDICINE PET WHOLE BODY  TECHNIQUE: 6.4 mCi F-18 FDG was injected intravenously. Full-ring PET imaging was performed from the skull base to thigh after the radiotracer. CT data was obtained and used for attenuation correction and anatomic localization.  Fasting blood glucose: 141 mg/dl  COMPARISON:  None.  FINDINGS: Mediastinal blood pool activity: SUV max 2.4  HEAD/NECK: No significant abnormal hypermetabolic activity in this region.  Incidental CT findings: Chronic right ethmoid and bilateral maxillary sinusitis. Mild bilateral common carotid atherosclerotic calcification.  CHEST: No significant abnormal hypermetabolic activity in this region.  Incidental CT findings: Atherosclerotic calcification of the thoracic aorta.  ABDOMEN/PELVIS: Accentuated activity in various segments of bowel, primarily colon, without correlatives CT findings, probably physiologic in nature.  Incidental CT findings: Aortoiliac atherosclerotic vascular disease. Postoperative findings along the left groin.  SKELETON: No significant abnormal hypermetabolic activity in this region.  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  EXTREMITIES: No significant abnormal hypermetabolic activity in this region.  Incidental CT findings: Along the left lower buttock and upper thigh region posterolaterally, a 1.5 by 5.7 by 8.4 cm fluid density structure is present with only faint metabolic activity,  likely postoperative.  IMPRESSION: 1. No current findings of active melanoma. 2. Along the left lower buttock and upper thigh region posterolaterally, a small subcutaneous collection is likely postoperative. 3.  Aortic Atherosclerosis (ICD10-I70.0). 4. Chronic paranasal sinusitis.   Impression and Plan:   78 year old woman with:  1.  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  She presented with a 1.5 mm lesion in the left thigh.  The final pathology showed T2a N1 with microscopic lymph node involvement in 1 out of 2 lymph nodes.  The natural course of this disease was reviewed today.  PET CT scan obtained on April 25, 2018 showed no evidence of metastatic disease.  Options of therapy which include adjuvant immunotherapy versus active surveillance was discussed.  Given her presentation and very little lymph node involvement, I have recommended to surveillance at this time and defer systemic therapy she develops any disease recurrence.  I recommended repeating her PET scan in 6 months and continue his dermatology surveillance every 3 months.   2.  Follow-up: We will be in 6 months after repeat PET scan.  15  minutes was spent with the patient face-to-face today.  More than 50% of time was dedicated to reviewing disease status, imaging studies, treatment options and answering questions regarding future plan of care.  Zola Button, MD 2/3/202010:59 AM

## 2018-04-30 NOTE — Telephone Encounter (Signed)
Scheduled appt per 02/03 los. ° °Printed calendar and avs per patient request. °

## 2018-05-16 ENCOUNTER — Ambulatory Visit: Payer: Medicare Other | Admitting: Family Medicine

## 2018-05-16 ENCOUNTER — Encounter: Payer: Self-pay | Admitting: Family Medicine

## 2018-05-16 ENCOUNTER — Other Ambulatory Visit: Payer: Self-pay

## 2018-05-16 VITALS — BP 128/80 | HR 65 | Temp 98.2°F | Ht 60.0 in | Wt 121.2 lb

## 2018-05-16 DIAGNOSIS — S00451A Superficial foreign body of right ear, initial encounter: Secondary | ICD-10-CM

## 2018-05-16 MED ORDER — NEOMYCIN-COLIST-HC-THONZONIUM 3.3-3-10-0.5 MG/ML OT SUSP: 3.0000 [drp] | Freq: Four times a day (QID) | OTIC | 0 refills | Status: DC

## 2018-05-16 NOTE — Patient Instructions (Signed)
Try to keep water out of ear for next couple of days.

## 2018-05-16 NOTE — Progress Notes (Signed)
  Subjective:     Patient ID: Gabrielle Robinson, female   DOB: [birth year], 78 y.o.   MRN: [id number]  HPI Patient is seen with foreign body right ear canal.  She had apparently home nurse visit yesterday and in the process of looking in her ears they noted foreign body-portion of hearing aid broke off.  She has not had any pain.  No drainage.  No change in hearing.  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  She has naturally kept out her hearing aid from the right side since yesterday.  Past Medical History:  Diagnosis Date  . Cancer ([place])    Left Hip Melanoma  . Diabetes mellitus without complication ([place])   . Hyperlipemia 01/05/2009   Qualifier: Diagnosis of  By: Arnoldo Morale MD, Balinda Quails   . Major depressive disorder with single episode, in full remission ([place]) 05/28/2015   Past Surgical History:  Procedure Laterality Date  . MELANOMA EXCISION Left 02/08/2018   Procedure: WIDE LOCAL EXCISION WITH ADVANCEMENT FLAP CLOSURE OF LEFT HIP MELANOMA ERAS PATHWAY;  Surgeon: [name], MD;  Location: [facility];  Service: General;  Laterality: Left;  . SENTINEL NODE BIOPSY Left 02/08/2018   Procedure: LEFT INGUINAL SENTINEL LYMPH NODE BIOPSY;  Surgeon: [name], MD;  Location: [facility];  Service: General;  Laterality: Left;    reports that she has never smoked. She has never used smokeless tobacco. She reports current alcohol use of about 21.0 standard drinks of alcohol per week. She reports that she does not use drugs. family history is not on file. No Known Allergies   Review of Systems  Constitutional: Negative for chills and fever.  HENT: Negative for ear discharge, ear pain and hearing loss.        Objective:   Physical Exam Constitutional:      Appearance: Normal appearance.  HENT:     Ears:     Comments: Left ear canal and eardrum appear normal.  She has blackened colored portion of a hearing aid lodged in the right canal.   Cardiovascular:     Rate and  Rhythm: Normal rate and regular rhythm.  Neurological:     Mental Status: She is alert.        Assessment:     Foreign body right ear canal with portion of her hearing aid deep in the canal    Plan:     -We were able to use alligator forceps and grasped edge of portion of hearing aid that was lodged in the canal and removed without difficulty.  She does have some erythema of the canal but no evidence for eardrum trauma. -Recommend keep water out of ear next few days and try Cortisporin otic suspension 3 to 4 drops right ear canal at least 4 times daily -Follow-up as needed  Eulas Post MD [facility]

## 2018-08-16 ENCOUNTER — Telehealth: Payer: Self-pay | Admitting: *Deleted

## 2018-08-16 ENCOUNTER — Other Ambulatory Visit: Payer: Self-pay

## 2018-08-16 ENCOUNTER — Encounter: Payer: Self-pay | Admitting: Family Medicine

## 2018-08-16 ENCOUNTER — Ambulatory Visit (INDEPENDENT_AMBULATORY_CARE_PROVIDER_SITE_OTHER): Payer: Medicare Other | Admitting: Family Medicine

## 2018-08-16 DIAGNOSIS — F325 Major depressive disorder, single episode, in full remission: Secondary | ICD-10-CM

## 2018-08-16 DIAGNOSIS — E785 Hyperlipidemia, unspecified: Secondary | ICD-10-CM

## 2018-08-16 DIAGNOSIS — E1169 Type 2 diabetes mellitus with other specified complication: Secondary | ICD-10-CM | POA: Diagnosis not present

## 2018-08-16 DIAGNOSIS — E119 Type 2 diabetes mellitus without complications: Secondary | ICD-10-CM

## 2018-08-16 NOTE — Telephone Encounter (Signed)
I called the pt and scheduled the visits as below.  PHQ-9 was completed earlier and entered in the chart.  Message forwarded to Dr Maudie Mercury.

## 2018-08-16 NOTE — Progress Notes (Signed)
Virtual Visit via Video Note  I connected with [name]  on 08/16/18 at 11:00 AM EDT by a video enabled telemedicine application and verified that I am speaking with the correct person using two identifiers.  Location patient: home Location provider:work or home office Persons participating in the virtual visit: patient, provider  I discussed the limitations of evaluation and management by telemedicine and the availability of in person appointments. The patient expressed understanding and agreed to proceed.   HPI:  Gabrielle Robinson is a pleasant 78 y.o. here for follow up. Chronic medical problems summarized below were reviewed for changes and stability and were updated as needed below. These issues and their treatment remain stable for the most part. Reports is doing well and has fully recovered from surgical treatment of the melanoma. Reports followed closely by her oncologist and her dermatologist whom she saw very recently. Is eating healthy and getting regular exercise. Reports mood is good. Denies CP, SOB, DOE, treatment intolerance or new symptoms.   Depression: -chronic, initially presented to me 02/2014 -she opted for low dose lexapro and advised CBT- wishes to continue the lexapro  DM: -well controlled on last check -meds: metformin 500mg  bid  HLD: -increased pravastatin 10/2015 -denies: hx statin intol, leg cramps, cog changes  New Dx Melanoma of skin: -dx in 2019 -seeing dermatology and oncology for managment  ROS: See pertinent positives and negatives per HPI.  Past Medical History:  Diagnosis Date  . Cancer ([place])    Left Hip Melanoma  . Diabetes mellitus without complication ([place])   . Hyperlipemia 01/05/2009   Qualifier: Diagnosis of  By: Arnoldo Morale MD, Balinda Quails   . Major depressive disorder with single episode, in full remission ([place]) 05/28/2015    Past Surgical History:  Procedure Laterality Date  . MELANOMA EXCISION Left 02/08/2018   Procedure: WIDE LOCAL EXCISION  WITH ADVANCEMENT FLAP CLOSURE OF LEFT HIP MELANOMA ERAS PATHWAY;  Surgeon: [name], MD;  Location: [facility];  Service: General;  Laterality: Left;  . SENTINEL NODE BIOPSY Left 02/08/2018   Procedure: LEFT INGUINAL SENTINEL LYMPH NODE BIOPSY;  Surgeon: [name], MD;  Location: [facility];  Service: General;  Laterality: Left;    History reviewed. No pertinent family history.  SOCIAL HX: see hpi   Current Outpatient Medications:  .  Biotin 5 MG TABS, Take 5 mg by mouth daily., Disp: , Rfl:  .  Calcium Acetate, Phos Binder, (CALCIUM ACETATE PO), Take 1 tablet by mouth daily., Disp: , Rfl:  .  diphenhydrAMINE (BENADRYL) 25 MG tablet, Take 12.5 mg by mouth at bedtime., Disp: , Rfl:  .  escitalopram (LEXAPRO) 5 MG tablet, TAKE ONE TABLET BY MOUTH ONE TIME DAILY, Disp: 90 tablet, Rfl: 1 .  metFORMIN (GLUCOPHAGE) 500 MG tablet, TAKE 1 TABLET BY MOUTH TWICE A DAY WITH MEALS, Disp: 180 tablet, Rfl: 1 .  Multiple Vitamin (MULTIVITAMIN WITH MINERALS) TABS tablet, Take 1 tablet by mouth daily., Disp: , Rfl:  .  neomycin-colistin-hydrocortisone-thonzonium (CORTISPORIN-TC) 3.05-28-08-0.5 MG/ML OTIC suspension, Place 3 drops into the right ear 4 (four) times daily., Disp: 10 mL, Rfl: 0 .  Polyethyl Glycol-Propyl Glycol (SYSTANE ULTRA OP), Place 1 drop into both eyes daily as needed (dry eyes)., Disp: , Rfl:  .  pravastatin (PRAVACHOL) 40 MG tablet, TAKE ONE TABLET BY MOUTH ONE TIME DAILY, Disp: 90 tablet, Rfl: 1  EXAM:  VITALS per patient if applicable: There were no vitals filed for this visit. There is no height or weight on file  to calculate BMI.   GENERAL: alert, oriented, appears well and in no acute distress  HEENT: atraumatic, conjunttiva clear, no obvious abnormalities on inspection of external nose and ears  NECK: normal movements of the head and neck  LUNGS: on inspection no signs of respiratory distress, breathing rate appears normal, no obvious gross SOB, gasping or wheezing  CV: no  obvious cyanosis  MS: moves all visible extremities without noticeable abnormality  PSYCH/NEURO: pleasant and cooperative, no obvious depression or anxiety, speech and thought processing grossly intact  ASSESSMENT AND PLAN:  Discussed the following assessment and plan:  Type 2 diabetes mellitus without complication, without long-term current use of insulin (HCC) - Plan: Basic metabolic panel, Hemoglobin A1c, Microalbumin / creatinine urine ratio  Major depressive disorder with single episode, in full remission ([place])  Hyperlipidemia associated with type 2 diabetes mellitus ([place]) - Plan: Lipid panel  Labs per orders - will set up lab visit.  Discussed prevention of COVID19, social distancing protocols. She is being very careful.  Lifestyle recs.  Continue current medications.  AWV and CPE in September.   I discussed the assessment and treatment plan with the patient. The patient was provided an opportunity to ask questions and all were answered. The patient agreed with the plan and demonstrated an understanding of the instructions.    Follow up instructions: Advised assistant Wendie Simmer to help patient arrange the following: -PHQ9 in epic and forward result to Dr. Maudie Mercury -lab visit for fasting labs in next 1 month -CPE with Dr. Ethlyn Gallery in office in September -AWV with Dr. Maudie Mercury virtual in September  Gabrielle Kern, DO

## 2018-08-16 NOTE — Telephone Encounter (Signed)
-----   Message from Lucretia Kern, DO sent at 08/16/2018 11:42 AM EDT ----- -PHQ9 in epic and forward result to Dr. Maudie Mercury -lab visit for fasting labs in next 1 month -CPE with Dr. Ethlyn Gallery in office in September -AWV with Dr. Maudie Mercury virtual in September

## 2018-08-16 NOTE — Telephone Encounter (Signed)
I have not placed any forms in the folder for pick up and will fax these if any are received.

## 2018-08-16 NOTE — Telephone Encounter (Signed)
[place] you are hanging in there. Thanks for your reply! Sorry I didn't catch it in the chart the first time. Do I have any forms to pick up? If so, could you fax them to me? Thanks!

## 2018-08-28 ENCOUNTER — Other Ambulatory Visit (INDEPENDENT_AMBULATORY_CARE_PROVIDER_SITE_OTHER): Payer: Medicare Other

## 2018-08-28 ENCOUNTER — Other Ambulatory Visit: Payer: Self-pay

## 2018-08-28 DIAGNOSIS — E119 Type 2 diabetes mellitus without complications: Secondary | ICD-10-CM | POA: Diagnosis not present

## 2018-08-28 DIAGNOSIS — E1169 Type 2 diabetes mellitus with other specified complication: Secondary | ICD-10-CM | POA: Diagnosis not present

## 2018-08-28 DIAGNOSIS — E785 Hyperlipidemia, unspecified: Secondary | ICD-10-CM

## 2018-08-28 LAB — HEMOGLOBIN A1C: Hgb A1c MFr Bld: 6.9 % — ABNORMAL HIGH (ref 4.6–6.5)

## 2018-08-28 LAB — BASIC METABOLIC PANEL
BUN: 13 mg/dL (ref 6–23)
CO2: 29 mEq/L (ref 19–32)
Calcium: 9.4 mg/dL (ref 8.4–10.5)
Chloride: 97 mEq/L (ref 96–112)
Creatinine, Ser: 0.83 mg/dL (ref 0.40–1.20)
GFR: 66.34 mL/min (ref >60.00)
Glucose, Bld: 153 mg/dL — ABNORMAL HIGH (ref 70–99)
Potassium: 4.2 mEq/L (ref 3.5–5.1)
Sodium: 135 mEq/L (ref 135–145)

## 2018-08-28 LAB — LIPID PANEL
Cholesterol: 199 mg/dL (ref 0–200)
HDL: 74.2 mg/dL (ref >39.00)
LDL Cholesterol: 95 mg/dL (ref 0–99)
NonHDL: 125.01
Total CHOL/HDL Ratio: 3
Triglycerides: 148 mg/dL (ref 0.0–149.0)
VLDL: 29.6 mg/dL (ref 0.0–40.0)

## 2018-08-28 LAB — MICROALBUMIN / CREATININE URINE RATIO
Creatinine,U: 247.5 mg/dL
Microalb Creat Ratio: 2.9 mg/g (ref 0.0–30.0)
Microalb, Ur: 7.2 mg/dL — ABNORMAL HIGH (ref 0.0–1.9)

## 2018-09-19 ENCOUNTER — Other Ambulatory Visit: Payer: Self-pay

## 2018-09-19 ENCOUNTER — Ambulatory Visit (INDEPENDENT_AMBULATORY_CARE_PROVIDER_SITE_OTHER): Payer: Medicare Other | Admitting: Family Medicine

## 2018-09-19 ENCOUNTER — Encounter: Payer: Self-pay | Admitting: Family Medicine

## 2018-09-19 VITALS — BP 138/78 | HR 81 | Temp 98.5°F | Ht 60.0 in | Wt 120.7 lb

## 2018-09-19 DIAGNOSIS — T162XXA Foreign body in left ear, initial encounter: Secondary | ICD-10-CM | POA: Diagnosis not present

## 2018-09-19 NOTE — Progress Notes (Signed)
  Subjective:     Patient ID: Gabrielle Robinson, female   DOB: [birth year], 78 y.o.   MRN: [id number]  HPI Patient is a with possible foreign body left ear.  She has a type of rubber piece that came off of her hearing aid.  She had a similar episode back in February where we had to clear her right canal.  She has minimal discomfort.  No drainage.  Symptoms just started yesterday  Past Medical History:  Diagnosis Date  . Cancer ([place])    Left Hip Melanoma  . Diabetes mellitus without complication ([place])   . Hyperlipemia 01/05/2009   Qualifier: Diagnosis of  By: Arnoldo Morale MD, Balinda Quails   . Major depressive disorder with single episode, in full remission ([place]) 05/28/2015   Past Surgical History:  Procedure Laterality Date  . MELANOMA EXCISION Left 02/08/2018   Procedure: WIDE LOCAL EXCISION WITH ADVANCEMENT FLAP CLOSURE OF LEFT HIP MELANOMA ERAS PATHWAY;  Surgeon: [name], MD;  Location: [facility];  Service: General;  Laterality: Left;  . SENTINEL NODE BIOPSY Left 02/08/2018   Procedure: LEFT INGUINAL SENTINEL LYMPH NODE BIOPSY;  Surgeon: [name], MD;  Location: [facility];  Service: General;  Laterality: Left;    reports that she has never smoked. She has never used smokeless tobacco. She reports current alcohol use of about 21.0 standard drinks of alcohol per week. She reports that she does not use drugs. family history is not on file. No Known Allergies   Review of Systems  Constitutional: Negative for chills and fever.  HENT: Positive for hearing loss. Negative for ear discharge.        Objective:   Physical Exam Constitutional:      Appearance: Normal appearance.  HENT:     Ears:     Comments: Right ear canal is clear.  Eardrum is normal.  Left canal reveals blackened colored foreign body from her hearing aid.  Removed with alligator forceps and she has some very mild erythema of the canal but otherwise normal.  Eardrum is otherwise normal Neurological:     Mental Status: She is alert.         Assessment:     Foreign body left ear canal with rubber piece from her hearing aid removed without difficulty with alligator forceps    Plan:     -Follow-up as needed  Eulas Post MD [facility] at [facility]

## 2018-09-24 ENCOUNTER — Other Ambulatory Visit: Payer: Self-pay

## 2018-09-24 ENCOUNTER — Encounter: Payer: Self-pay | Admitting: Family Medicine

## 2018-09-24 ENCOUNTER — Ambulatory Visit (INDEPENDENT_AMBULATORY_CARE_PROVIDER_SITE_OTHER): Payer: Medicare Other | Admitting: Family Medicine

## 2018-09-24 VITALS — BP 116/78 | HR 90 | Temp 97.8°F | Ht 60.0 in | Wt 121.4 lb

## 2018-09-24 DIAGNOSIS — T162XXA Foreign body in left ear, initial encounter: Secondary | ICD-10-CM | POA: Diagnosis not present

## 2018-09-24 NOTE — Progress Notes (Signed)
  Subjective:     Patient ID: Gabrielle Robinson, female   DOB: 06/07/1940, 78 y.o.   MRN: [id number]  HPI Patient presents again with foreign body left ear from rubber piece on her hearing aid.  She had similar piece that came off last week in the same ear which we retrieved without difficulty.  She does not have any pain.  No drainage.  Past Medical History:  Diagnosis Date  . Cancer ([place])    Left Hip Melanoma  . Diabetes mellitus without complication ([place])   . Hyperlipemia 01/05/2009   Qualifier: Diagnosis of  By: Arnoldo Morale MD, Balinda Quails   . Major depressive disorder with single episode, in full remission ([place]) 05/28/2015   Past Surgical History:  Procedure Laterality Date  . MELANOMA EXCISION Left 02/08/2018   Procedure: WIDE LOCAL EXCISION WITH ADVANCEMENT FLAP CLOSURE OF LEFT HIP MELANOMA ERAS PATHWAY;  Surgeon: [name], MD;  Location: [facility];  Service: General;  Laterality: Left;  . SENTINEL NODE BIOPSY Left 02/08/2018   Procedure: LEFT INGUINAL SENTINEL LYMPH NODE BIOPSY;  Surgeon: [name], MD;  Location: [facility];  Service: General;  Laterality: Left;    reports that she has never smoked. She has never used smokeless tobacco. She reports current alcohol use of about 21.0 standard drinks of alcohol per week. She reports that she does not use drugs. family history is not on file. No Known Allergies   Review of Systems  Constitutional: Negative for chills and fever.  HENT: Negative for ear discharge and ear pain.        Objective:   Physical Exam Constitutional:      Appearance: Normal appearance. She is not toxic-appearing.  HENT:     Ears:     Comments: Patient has blackened colored foreign body which represents a rubber piece from her hearing aid in the left canal.  This was removed with alligator type forceps without difficulty.  Eardrum and ear canal appear normal otherwise Neurological:     Mental Status: She is alert.        Assessment:     Foreign body left ear  removed with alligator type forceps without difficulty    Plan:     -Patient will check with her audiologist and hearing aid manufacturer to see if they have any other suggestions as this has been a recurrent problem and this is at least the third or fourth time she has had the same piece come off  Eulas Post MD [facility] at [facility]

## 2018-10-01 ENCOUNTER — Other Ambulatory Visit: Payer: Self-pay | Admitting: Family Medicine

## 2018-10-30 ENCOUNTER — Ambulatory Visit (HOSPITAL_COMMUNITY)
Admission: RE | Admit: 2018-10-30 | Discharge: 2018-10-30 | Disposition: A | Discharge status: Home / Self Care | Payer: Medicare Other | Source: Ambulatory Visit | Attending: Oncology | Admitting: Oncology

## 2018-10-30 ENCOUNTER — Inpatient Hospital Stay: Payer: Medicare Other | Attending: Oncology

## 2018-10-30 ENCOUNTER — Other Ambulatory Visit: Payer: Self-pay

## 2018-10-30 DIAGNOSIS — Z79899 Other long term (current) drug therapy: Secondary | ICD-10-CM | POA: Diagnosis not present

## 2018-10-30 DIAGNOSIS — Z85820 Personal history of malignant melanoma of skin: Secondary | ICD-10-CM | POA: Insufficient documentation

## 2018-10-30 DIAGNOSIS — C439 Malignant melanoma of skin, unspecified: Secondary | ICD-10-CM | POA: Diagnosis present

## 2018-10-30 DIAGNOSIS — E785 Hyperlipidemia, unspecified: Secondary | ICD-10-CM | POA: Diagnosis not present

## 2018-10-30 DIAGNOSIS — Z7984 Long term (current) use of oral hypoglycemic drugs: Secondary | ICD-10-CM | POA: Insufficient documentation

## 2018-10-30 DIAGNOSIS — E119 Type 2 diabetes mellitus without complications: Secondary | ICD-10-CM | POA: Diagnosis not present

## 2018-10-30 LAB — CMP (CANCER CENTER ONLY)
ALT: 16 U/L (ref 0–44)
AST: 18 U/L (ref 15–41)
Albumin: 4.1 g/dL (ref 3.5–5.0)
Alkaline Phosphatase: 83 U/L (ref 38–126)
Anion gap: 10 (ref 5–15)
BUN: 10 mg/dL (ref 8–23)
CO2: 27 mmol/L (ref 22–32)
Calcium: 9.4 mg/dL (ref 8.9–10.3)
Chloride: 99 mmol/L (ref 98–111)
Creatinine: 0.89 mg/dL (ref 0.44–1.00)
GFR, Est AFR Am: >60 mL/min (ref >60)
GFR, Estimated: >60 mL/min (ref >60)
Glucose, Bld: 150 mg/dL — ABNORMAL HIGH (ref 70–99)
Potassium: 4.7 mmol/L (ref 3.5–5.1)
Sodium: 136 mmol/L (ref 135–145)
Total Bilirubin: 0.4 mg/dL (ref 0.3–1.2)
Total Protein: 7.3 g/dL (ref 6.5–8.1)

## 2018-10-30 LAB — CBC WITH DIFFERENTIAL (CANCER CENTER ONLY)
Abs Immature Granulocytes: 0.02 10*3/uL (ref 0.00–0.07)
Basophils Absolute: 0.1 10*3/uL (ref 0.0–0.1)
Basophils Relative: 1 %
Eosinophils Absolute: 0.4 10*3/uL (ref 0.0–0.5)
Eosinophils Relative: 4 %
HCT: 41.9 % (ref 36.0–46.0)
Hemoglobin: 13.7 g/dL (ref 12.0–15.0)
Immature Granulocytes: 0 %
Lymphocytes Relative: 22 %
Lymphs Abs: 2 10*3/uL (ref 0.7–4.0)
MCH: 30 pg (ref 26.0–34.0)
MCHC: 32.7 g/dL (ref 30.0–36.0)
MCV: 91.9 fL (ref 80.0–100.0)
Monocytes Absolute: 0.8 10*3/uL (ref 0.1–1.0)
Monocytes Relative: 9 %
Neutro Abs: 5.9 10*3/uL (ref 1.7–7.7)
Neutrophils Relative %: 64 %
Platelet Count: 253 10*3/uL (ref 150–400)
RBC: 4.56 MIL/uL (ref 3.87–5.11)
RDW: 11.9 % (ref 11.5–15.5)
WBC Count: 9.2 10*3/uL (ref 4.0–10.5)
nRBC: 0 % (ref 0.0–0.2)

## 2018-10-30 LAB — GLUCOSE, CAPILLARY: Glucose-Capillary: 159 mg/dL — ABNORMAL HIGH (ref 70–99)

## 2018-10-30 MED ORDER — FLUDEOXYGLUCOSE F - 18 (FDG) INJECTION
6.0700 | Freq: Once | INTRAVENOUS | Status: AC | PRN
  Administered 2018-10-30: 6.07 via INTRAVENOUS

## 2018-11-01 ENCOUNTER — Other Ambulatory Visit: Payer: Self-pay

## 2018-11-01 ENCOUNTER — Inpatient Hospital Stay: Payer: Medicare Other | Admitting: Oncology

## 2018-11-01 VITALS — BP 154/63 | HR 86 | Temp 98.2°F | Resp 17 | Ht 60.0 in | Wt 121.3 lb

## 2018-11-01 DIAGNOSIS — Z85820 Personal history of malignant melanoma of skin: Secondary | ICD-10-CM | POA: Diagnosis not present

## 2018-11-01 DIAGNOSIS — C439 Malignant melanoma of skin, unspecified: Secondary | ICD-10-CM | POA: Diagnosis not present

## 2018-11-01 NOTE — Progress Notes (Signed)
Hematology and Oncology Follow Up Visit  Gabrielle Robinson [id number] May 04, 1940 78 y.o. 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  She was found to have 1 out of 2 lymph node involvement with less than 0.1 mm aggregate without any evidence of metastatic disease.   Prior Therapy:  She is a status post wide excision and sentinel lymph node sampling completed and February 08, 2018.  Current therapy: Active surveillance without any adjuvant therapy.  Interim History: [name] returns today for a repeat evaluation.  Since the last visit, she reports no complaints at this time.  She denies any recent hospitalization or illnesses.  She denies any skin rashes or lesions.  She denies any painful adenopathy or constitutional symptoms.  She remains reasonably active and attends activities of daily living without any decline.   Patient denied any alteration mental status, neuropathy, confusion or dizziness.  Denies any headaches or lethargy.  Denies any night sweats, weight loss or changes in appetite.  Denied orthopnea, dyspnea on exertion or chest discomfort.  Denies shortness of breath, difficulty breathing hemoptysis or cough.  Denies any abdominal distention, nausea, early satiety or dyspepsia.  Denies any hematuria, frequency, dysuria or nocturia.  Denies any skin irritation, dryness or rash.  Denies any ecchymosis or petechiae.  Denies any lymphadenopathy or clotting.  Denies any heat or cold intolerance.  Denies any anxiety or depression.  Remaining review of system is negative.     Medications: Updated without any changes.  Current Outpatient Medications  Medication Sig Dispense Refill  . Biotin 5 MG TABS Take 5 mg by mouth daily.    . Calcium Acetate, Phos Binder, (CALCIUM ACETATE PO) Take 1 tablet by mouth daily.    . diphenhydrAMINE (BENADRYL) 25 MG tablet Take 12.5 mg by  mouth at bedtime.    Marland Kitchen escitalopram (LEXAPRO) 5 MG tablet TAKE ONE TABLET BY MOUTH ONE TIME DAILY  90 tablet 0  . metFORMIN (GLUCOPHAGE) 500 MG tablet TAKE ONE TABLET BY MOUTH TWICE A DAY WITH MEALS 180 tablet 0  . Multiple Vitamin (MULTIVITAMIN WITH MINERALS) TABS tablet Take 1 tablet by mouth daily.    Marland Kitchen neomycin-colistin-hydrocortisone-thonzonium (CORTISPORIN-TC) 3.05-28-08-0.5 MG/ML OTIC suspension Place 3 drops into the right ear 4 (four) times daily. 10 mL 0  . Polyethyl Glycol-Propyl Glycol (SYSTANE ULTRA OP) Place 1 drop into both eyes daily as needed (dry eyes).    . pravastatin (PRAVACHOL) 40 MG tablet TAKE ONE TABLET BY MOUTH ONE TIME DAILY  90 tablet 0   No current facility-administered medications for this visit.      Allergies: No Known Allergies  Past Medical History, Surgical history, Social history, and Family History remain without any changes on review.    Physical Exam: Blood pressure (!) 154/63, pulse 86, temperature 98.2 F (36.8 C), temperature source Temporal, resp. rate 17, height 5' (1.524 m), weight 121 lb 5 oz (55 kg), SpO2 98 %.    ECOG:     General appearance: Comfortable appearing without any discomfort Head: Normocephalic without any trauma Oropharynx: Mucous membranes are moist and pink without any thrush or ulcers. Eyes: Pupils are equal and round reactive to light. Lymph nodes: No cervical, supraclavicular, inguinal or axillary lymphadenopathy.   Heart:regular rate and rhythm.  S1 and S2 without leg edema. Lung: Clear without any rhonchi or wheezes.  No dullness to percussion. Abdomin: Soft, nontender, nondistended with  good bowel sounds.  No hepatosplenomegaly. Musculoskeletal: No joint deformity or effusion.  Full range of motion noted. Neurological: No deficits noted on motor, sensory and deep tendon reflex exam. Skin: No petechial rash or dryness.  Appeared moist.      Lab Results: Lab Results  Component Value Date   WBC 9.2  10/30/2018   HGB 13.7 10/30/2018   HCT 41.9 10/30/2018   MCV 91.9 10/30/2018   PLT 253 10/30/2018     Chemistry      Component Value Date/Time   NA 136 10/30/2018 1131   K 4.7 10/30/2018 1131   CL 99 10/30/2018 1131   CO2 27 10/30/2018 1131   BUN 10 10/30/2018 1131   CREATININE 0.89 10/30/2018 1131      Component Value Date/Time   CALCIUM 9.4 10/30/2018 1131   ALKPHOS 83 10/30/2018 1131   AST 18 10/30/2018 1131   ALT 16 10/30/2018 1131   BILITOT 0.4 10/30/2018 1131      EXAM: NUCLEAR MEDICINE PET WHOLE BODY  TECHNIQUE: 6.07 mCi F-18 FDG was injected intravenously. Full-ring PET imaging was performed from the skull base to thigh after the radiotracer. CT data was obtained and used for attenuation correction and anatomic localization.  Fasting blood glucose: 159 mg/dl  COMPARISON:  PET-CT 04/25/2018  FINDINGS: Mediastinal blood pool activity: SUV max 3.19  HEAD/NECK: No hypermetabolic activity in the scalp. No hypermetabolic cervical lymph nodes.  Incidental CT findings: none  CHEST: No hypermetabolic mediastinal or hilar nodes. No suspicious pulmonary nodules on the CT scan.  Incidental CT findings: none  ABDOMEN/PELVIS: No abnormal hypermetabolic activity within the liver, pancreas, adrenal glands, or spleen. No hypermetabolic lymph nodes in the abdomen or pelvis.  Incidental CT findings: none  SKELETON: No focal hypermetabolic activity to suggest skeletal metastasis.  Incidental CT findings: none  EXTREMITIES: No abnormal hypermetabolic activity in the lower extremities.  Incidental CT findings: none  IMPRESSION: Negative PET-CT for recurrent or metastatic melanoma.    Impression and Plan:   78 year old woman with:  1.  T2a N1 melanoma of the thigh diagnosed in 2019.  She underwent wide excision and sentinel lymph node sampling in November 2019.  She is currently on active surveillance and did not receive adjuvant  therapy.  Laboratory data and a PET CT scan obtained on 10/30/2018 was personally reviewed and showed no evidence of relapsed disease at this time.  Risk of relapse was assessed at this time as well as the natural course of this disease.  Given the fact that she had the microscopic involvement of her sentinel lymph node it is reasonable to continue with aggressive surveillance and repeat imaging studies in 6 to 8 months.  He is agreeable to proceed with this approach.  2.  Dermatology surveillance: She is up-to-date and continues to follow with dermatology every 3 months which I have encouraged her to do so.  4.  Age-appropriate cancer screening: I recommended continuing annual mammography that she is currently doing.   5.  Follow-up: 8 months for repeat evaluation.  25  minutes was spent with the patient face-to-face today.  More than 50% of time was spent on reviewing laboratory data, imaging study review, updating her disease status and answering questions regarding future plan of care.    Zola Button, MD 8/6/202010:01 AM

## 2018-11-12 NOTE — Progress Notes (Signed)
Pt had drawn 01/30/18 in hospital

## 2018-11-28 ENCOUNTER — Other Ambulatory Visit: Payer: Self-pay

## 2018-11-28 ENCOUNTER — Encounter: Payer: Self-pay | Admitting: Family Medicine

## 2018-11-28 ENCOUNTER — Telehealth: Payer: Self-pay | Admitting: *Deleted

## 2018-11-28 ENCOUNTER — Ambulatory Visit (INDEPENDENT_AMBULATORY_CARE_PROVIDER_SITE_OTHER): Payer: Medicare Other | Admitting: Family Medicine

## 2018-11-28 VITALS — BP 130/64 | HR 78 | Temp 98.0°F | Ht 61.5 in | Wt 118.0 lb

## 2018-11-28 DIAGNOSIS — Z1239 Encounter for other screening for malignant neoplasm of breast: Secondary | ICD-10-CM

## 2018-11-28 DIAGNOSIS — E119 Type 2 diabetes mellitus without complications: Secondary | ICD-10-CM

## 2018-11-28 DIAGNOSIS — E2839 Other primary ovarian failure: Secondary | ICD-10-CM

## 2018-11-28 DIAGNOSIS — Z1211 Encounter for screening for malignant neoplasm of colon: Secondary | ICD-10-CM

## 2018-11-28 DIAGNOSIS — Z0000 Encounter for general adult medical examination without abnormal findings: Secondary | ICD-10-CM | POA: Diagnosis not present

## 2018-11-28 NOTE — Telephone Encounter (Signed)
Cologuard order faxed to [facility] at [phone].  

## 2018-11-28 NOTE — Progress Notes (Signed)
Gabrielle Robinson DOB: [birth year] Encounter date: 11/28/2018  This isa 78 y.o. female who presents to establish care. Chief Complaint  Patient presents with  . Annual Exam    History of present illness: No concerns today.   Depression: chronic. Lexapro. (no therapy) States more getting upset than actual depression.   DMII: metformin 500mg  BID. Not checking blood sugars at home.  HLD: pravastatin: no complaints.  Melanoma - follows with derm/oncology  pap history: doesn't remember last pap. Sister has recently been treated for vaginal melanoma so she had concerns about getting exam.  Last colon cancer screening: colonoscopy in 01/2009 Due for mammogram. DEXA (>/= 65): did bone density 12/2015  Past Medical History:  Diagnosis Date  . Cancer ([place])    Left Hip Melanoma  . Diabetes mellitus without complication ([place])   . Hyperlipemia 01/05/2009   Qualifier: Diagnosis of  By: Arnoldo Morale MD, Balinda Quails   . Major depressive disorder with single episode, in full remission ([place]) 05/28/2015   Past Surgical History:  Procedure Laterality Date  . MELANOMA EXCISION Left 02/08/2018   Procedure: WIDE LOCAL EXCISION WITH ADVANCEMENT FLAP CLOSURE OF LEFT HIP MELANOMA ERAS PATHWAY;  Surgeon: [name], MD;  Location: [facility];  Service: General;  Laterality: Left;  . SENTINEL NODE BIOPSY Left 02/08/2018   Procedure: LEFT INGUINAL SENTINEL LYMPH NODE BIOPSY;  Surgeon: [name], MD;  Location: [facility];  Service: General;  Laterality: Left;   No Known Allergies Current Meds  Medication Sig  . Biotin 5 MG TABS Take 5 mg by mouth daily.  . Calcium Acetate, Phos Binder, (CALCIUM ACETATE PO) Take 1 tablet by mouth daily.  . Cyanocobalamin (VITAMIN B-12 PO) Take by mouth.  . diphenhydrAMINE (BENADRYL) 25 MG tablet Take 12.5 mg by mouth at bedtime.  Marland Kitchen escitalopram (LEXAPRO) 5 MG tablet TAKE ONE TABLET BY MOUTH ONE TIME DAILY   . metFORMIN (GLUCOPHAGE) 500 MG tablet TAKE ONE TABLET BY MOUTH TWICE A DAY WITH  MEALS  . Multiple Vitamin (MULTIVITAMIN WITH MINERALS) TABS tablet Take 1 tablet by mouth daily.  Marland Kitchen OVER THE COUNTER MEDICATION Sleep aid  . Polyethyl Glycol-Propyl Glycol (SYSTANE ULTRA OP) Place 1 drop into both eyes daily as needed (dry eyes).  . pravastatin (PRAVACHOL) 40 MG tablet TAKE ONE TABLET BY MOUTH ONE TIME DAILY   . [DISCONTINUED] neomycin-colistin-hydrocortisone-thonzonium (CORTISPORIN-TC) 3.05-28-08-0.5 MG/ML OTIC suspension Place 3 drops into the right ear 4 (four) times daily.   Social History   Tobacco Use  . Smoking status: Never Smoker  . Smokeless tobacco: Never Used  Substance Use Topics  . Alcohol use: Yes    Alcohol/week: 21.0 standard drinks    Types: 21 Glasses of wine per week    Comment: occ   Family History  Problem Relation Age of Onset  . Diabetes Mellitus II Mother   . Diabetes Mellitus II Father   . Melanoma Sister        vaginal  . Breast cancer Maternal Grandmother   . Renal cancer Maternal Grandfather      Review of Systems  Constitutional: Negative for activity change, appetite change, chills, fatigue, fever and unexpected weight change.  HENT: Negative for congestion, ear pain, hearing loss, sinus pressure, sinus pain, sore throat and trouble swallowing.   Eyes: Negative for pain and visual disturbance.  Respiratory: Negative for cough, chest tightness, shortness of breath and wheezing.   Cardiovascular: Negative for chest pain, palpitations and leg swelling.  Gastrointestinal: Negative for abdominal pain, blood in  stool, constipation, diarrhea, nausea and vomiting.  Genitourinary: Negative for difficulty urinating and menstrual problem.  Musculoskeletal: Negative for arthralgias and back pain.  Skin: Negative for rash.  Neurological: Negative for dizziness, weakness, numbness and headaches.  Hematological: Negative for adenopathy. Does not bruise/bleed easily.  Psychiatric/Behavioral: Negative for sleep disturbance and suicidal ideas. The  patient is not nervous/anxious.     Objective:  BP 130/64 (BP Location: Left Arm, Patient Position: Sitting, Cuff Size: Normal)   Pulse 78   Temp 98 F (36.7 C) (Temporal)   Ht 5' 1.5" (1.562 m)   Wt 118 lb (53.5 kg)   SpO2 98%   BMI 21.93 kg/m   Weight: 118 lb (53.5 kg)   BP Readings from Last 3 Encounters:  11/28/18 130/64  11/01/18 (!) 154/63  09/24/18 116/78   Wt Readings from Last 3 Encounters:  11/28/18 118 lb (53.5 kg)  11/01/18 121 lb 5 oz (55 kg)  09/24/18 121 lb 6.4 oz (55.1 kg)    Physical Exam Exam conducted with a chaperone present.  Constitutional:      General: She is not in acute distress.    Appearance: She is well-developed.  HENT:     Head: Normocephalic and atraumatic.     Right Ear: External ear normal.     Left Ear: External ear normal.     Mouth/Throat:     Pharynx: No oropharyngeal exudate.  Eyes:     Conjunctiva/sclera: Conjunctivae normal.     Pupils: Pupils are equal, round, and reactive to light.  Neck:     Musculoskeletal: Normal range of motion and neck supple.     Thyroid: No thyromegaly.  Cardiovascular:     Rate and Rhythm: Normal rate and regular rhythm.     Heart sounds: Normal heart sounds. No murmur. No friction rub. No gallop.   Pulmonary:     Effort: Pulmonary effort is normal.     Breath sounds: Normal breath sounds.  Abdominal:     General: Bowel sounds are normal. There is no distension.     Palpations: Abdomen is soft. There is no mass.     Tenderness: There is no abdominal tenderness. There is no guarding.     Hernia: No hernia is present.  Genitourinary:    Exam position: Supine.     Labia:        Right: No rash, tenderness or lesion.        Left: No rash, tenderness or lesion.      Vagina: Normal. No erythema, bleeding or lesions.  Musculoskeletal: Normal range of motion.        General: No tenderness or deformity.  Lymphadenopathy:     Cervical: No cervical adenopathy.  Skin:    General: Skin is warm and  dry.     Findings: No rash.     Comments: Normal foot exam with normal monofilament sensation. No ulcerations, callous.  Neurological:     Mental Status: She is alert and oriented to person, place, and time.     Deep Tendon Reflexes: Reflexes normal.     Reflex Scores:      Tricep reflexes are 2+ on the right side and 2+ on the left side.      Bicep reflexes are 2+ on the right side and 2+ on the left side.      Brachioradialis reflexes are 2+ on the right side and 2+ on the left side.      Patellar reflexes are 2+ on the  right side and 2+ on the left side. Psychiatric:        Speech: Speech normal.        Behavior: Behavior normal.        Thought Content: Thought content normal.     Assessment/Plan:  1. Preventative health care Orders placed for mammogram, cologuard today after discussion of screening preferences. Wants to wait for flu shot.   Keep up with healthy lifestyle and eating!  2. Estrogen deficiency Due for repeat bone density. Order placed. Patient will schedule along with mammogram.  - DG Bone Density; Future  3. Screening for colon cancer - Cologuard  4. Breast cancer screening - MM DIGITAL SCREENING BILATERAL; Future  5. Type 2 diabetes mellitus without complication, without long-term current use of insulin ([facility]) Blood sugars have been stable. Continue metformin.   Return for keep AWV with Dr. Maudie Mercury next week; 6 month chronic condition visit to follow that.  Micheline Rough, MD

## 2018-11-28 NOTE — Patient Instructions (Addendum)
Let me know if any worsening of urinary symptoms. Timed urination and working on kegel exercises should help. I would give this a few month trial.   It was nice meeting you today!  Kegel Exercises  Kegel exercises can help strengthen your pelvic floor muscles. The pelvic floor is a group of muscles that support your rectum, small intestine, and bladder. In females, pelvic floor muscles also help support the womb (uterus). These muscles help you control the flow of urine and stool. Kegel exercises are painless and simple, and they do not require any equipment. Your provider may suggest Kegel exercises to:  Improve bladder and bowel control.  Improve sexual response.  Improve weak pelvic floor muscles after surgery to remove the uterus (hysterectomy) or pregnancy (females).  Improve weak pelvic floor muscles after prostate gland removal or surgery (males). Kegel exercises involve squeezing your pelvic floor muscles, which are the same muscles you squeeze when you try to stop the flow of urine or keep from passing gas. The exercises can be done while sitting, standing, or lying down, but it is best to vary your position. Exercises How to do Kegel exercises: 1. Squeeze your pelvic floor muscles tight. You should feel a tight lift in your rectal area. If you are a female, you should also feel a tightness in your vaginal area. Keep your stomach, buttocks, and legs relaxed. 2. Hold the muscles tight for up to 10 seconds. 3. Breathe normally. 4. Relax your muscles. 5. Repeat as told by your health care provider. Repeat this exercise daily as told by your health care provider. Continue to do this exercise for at least 4-6 weeks, or for as long as told by your health care provider. You may be referred to a physical therapist who can help you learn more about how to do Kegel exercises. Depending on your condition, your health care provider may recommend:  Varying how long you squeeze your muscles.   Doing several sets of exercises every day.  Doing exercises for several weeks.  Making Kegel exercises a part of your regular exercise routine. This information is not intended to replace advice given to you by your health care provider. Make sure you discuss any questions you have with your health care provider. Document Released: 02/29/2012 Document Revised: 11/01/2017 Document Reviewed: 11/01/2017 Elsevier Patient Education  2020 [employer].

## 2018-12-04 ENCOUNTER — Ambulatory Visit: Payer: Medicare Other | Admitting: Family Medicine

## 2019-01-01 ENCOUNTER — Other Ambulatory Visit: Payer: Self-pay

## 2019-01-01 ENCOUNTER — Ambulatory Visit (INDEPENDENT_AMBULATORY_CARE_PROVIDER_SITE_OTHER): Payer: Medicare Other | Admitting: Family Medicine

## 2019-01-01 DIAGNOSIS — Z5329 Procedure and treatment not carried out because of patient's decision for other reasons: Secondary | ICD-10-CM

## 2019-01-01 NOTE — Progress Notes (Signed)
Called multiple phone numbers with no answer. LM.

## 2019-01-08 ENCOUNTER — Encounter: Payer: Self-pay | Admitting: Family Medicine

## 2019-01-08 ENCOUNTER — Ambulatory Visit (INDEPENDENT_AMBULATORY_CARE_PROVIDER_SITE_OTHER): Payer: Medicare Other | Admitting: Family Medicine

## 2019-01-08 ENCOUNTER — Other Ambulatory Visit: Payer: Self-pay

## 2019-01-08 DIAGNOSIS — Z0000 Encounter for general adult medical examination without abnormal findings: Secondary | ICD-10-CM | POA: Diagnosis not present

## 2019-01-08 DIAGNOSIS — E119 Type 2 diabetes mellitus without complications: Secondary | ICD-10-CM

## 2019-01-08 DIAGNOSIS — E785 Hyperlipidemia, unspecified: Secondary | ICD-10-CM | POA: Diagnosis not present

## 2019-01-08 DIAGNOSIS — F325 Major depressive disorder, single episode, in full remission: Secondary | ICD-10-CM

## 2019-01-08 NOTE — Patient Instructions (Addendum)
  [name] , Thank you for taking time to come for your Medicare Wellness Visit. I appreciate your ongoing commitment to your health goals. Please review the following plan we discussed and let me know if I can assist you in the future.  Please schedule follow up with me or Dr. Ethlyn Gallery in about 4 months.  These are the goals we discussed:    -please eat a healthy low sugar, low carb diet -get regular aerobic exercise - about 150 minutes per week -get your flu shot and your diabetes lab check -please complete the cologuard test -please schedule your mammogram and dexa  This is a list of the screening recommended for you and due dates:  Health Maintenance  Topic Date Due  . Flu Shot  10/27/2018  . Eye exam for diabetics  02/16/2019*  . Hemoglobin A1C  02/27/2019  . Urine Protein Check  08/28/2019  . Complete foot exam   11/29/2019  . Tetanus Vaccine  12/06/2027  . DEXA scan (bone density measurement)  Completed  . Pneumonia vaccines  Completed  *Topic was postponed. The date shown is not the original due date.

## 2019-01-08 NOTE — Progress Notes (Signed)
Medicare Annual Preventive Care Visit  (initial annual wellness or annual wellness exam)  Virtual Visit via Video Note  I connected with [name]  on 08/14/18 at  3:20 PM EDT by a video enabled telemedicine application and verified that I am speaking with the correct person using two identifiers.  Location patient: home Location provider:work or home office Persons participating in the virtual visit: patient, provider  Concerns and/or follow up today:  Gabrielle Robinson is a pleasant 78 yo with a PMG Depression on low dose lexapro, DM (well controlled), HLD and Melanoma (managed by specialist.) Conditions stable or updated below. She saw Dr. Ethlyn Gallery recently for her CPE and follow up. Reports she an her family have been good doing the pandemic. Denies any concerns at this time. Her dexa, mammogram and cologuard have been ordered. She has the cologuard but has not completed it. She is due for her diabetes lab check and her flu shot. Flu shot is already set up. Not much exercise. Diet ok.  Depression: -chronic, initially presented to me 02/2014 -she opted for low dose lexapro and advised CBT - wishes to continue the lexapro   DM: -well controlled on last check -meds: metformin 500mg  bid   HLD: -increased pravastatin 10/2015 -denies: hx statin intol, leg cramps, cog changes   New Dx Melanoma of skin: -dx in 2019 -seeing dermatology and oncology for managment  See HM section in Epic for other details of completed HM. See scanned documentation under Media Tab for further documentation HPI, health risk assessment. See Media Tab and Care Teams sections in Epic for other providers.  ROS: negative for report of fevers, unintentional weight loss, vision changes, vision loss, hearing loss or change, chest pain, sob, hemoptysis, melena, hematochezia, hematuria, genital discharge or lesions, falls, bleeding or bruising, loc, thoughts of suicide or self harm, memory loss.  1.) Patient-completed health risk  assessment  - completed and reviewed, see scanned documentation  2.) Review of Medical History: -PMH, PSH, Family History and current specialty and care providers reviewed and updated and listed below  - see scanned in document in chart and below    See HM section in Epic for other details of completed HM. See scanned documentation under Media Tab for further documentation HPI, health risk assessment. See Media Tab and Care Teams sections in Epic for other providers.  ROS: negative for report of fevers, unintentional weight loss, vision changes, vision loss, hearing loss or change, chest pain, sob, hemoptysis, melena, hematochezia, hematuria, genital discharge or lesions, falls, bleeding or bruising, loc, thoughts of suicide or self harm, memory loss  1.) Patient-completed health risk assessment  - completed and reviewed, see scanned documentation  2.) Review of Medical History: -PMH, PSH, Family History and current specialty and care providers reviewed and updated and listed below  - see scanned in document in chart and below Patient Care Team: [name], MD as PCP - General (Family Medicine) Wyatt Portela, MD as Consulting Physician (Oncology) Jarome Matin, MD as Consulting Physician (Dermatology)   Past Medical History:  Diagnosis Date  . Cancer ([place])    Left Hip Melanoma  . Diabetes mellitus without complication ([place])   . Hyperlipemia 01/05/2009   Qualifier: Diagnosis of  By: Arnoldo Morale MD, Balinda Quails   . Major depressive disorder with single episode, in full remission ([place]) 05/28/2015    Past Surgical History:  Procedure Laterality Date  . MELANOMA EXCISION Left 02/08/2018   Procedure: WIDE LOCAL EXCISION WITH ADVANCEMENT FLAP CLOSURE OF LEFT  HIP MELANOMA ERAS PATHWAY;  Surgeon: [name], MD;  Location: [facility];  Service: General;  Laterality: Left;  . SENTINEL NODE BIOPSY Left 02/08/2018   Procedure: LEFT INGUINAL SENTINEL LYMPH NODE BIOPSY;  Surgeon: [name],  MD;  Location: [facility];  Service: General;  Laterality: Left;    Social History   Socioeconomic History  . Marital status: Married    Spouse name: Not on file  . Number of children: Not on file  . Years of education: Not on file  . Highest education level: Not on file  Occupational History  . Not on file  Social Needs  . Financial resource strain: Not on file  . Food insecurity    Worry: Not on file    Inability: Not on file  . Transportation needs    Medical: Not on file    Non-medical: Not on file  Tobacco Use  . Smoking status: Never Smoker  . Smokeless tobacco: Never Used  Substance and Sexual Activity  . Alcohol use: Yes    Alcohol/week: 21.0 standard drinks    Types: 21 Glasses of wine per week    Comment: occ  . Drug use: No  . Sexual activity: Not on file  Lifestyle  . Physical activity    Days per week: Not on file    Minutes per session: Not on file  . Stress: Not on file  Relationships  . Social Herbalist on phone: Not on file    Gets together: Not on file    Attends religious service: Not on file    Active member of club or organization: Not on file    Attends meetings of clubs or organizations: Not on file    Relationship status: Not on file  . Intimate partner violence    Fear of current or ex partner: Not on file    Emotionally abused: Not on file    Physically abused: Not on file    Forced sexual activity: Not on file  Other Topics Concern  . 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 Diabetes Mellitus II Mother   . Diabetes Mellitus II Father   . Melanoma Sister        vaginal  . Breast cancer Maternal Grandmother   . Renal cancer Maternal Grandfather     Current  Outpatient Medications on File Prior to Visit  Medication Sig Dispense Refill  . Biotin 5 MG TABS Take 5 mg by mouth daily.    . Calcium Acetate, Phos Binder, (CALCIUM ACETATE PO) Take 1 tablet by mouth daily.    . Cyanocobalamin (VITAMIN B-12 PO) Take by mouth.    . diphenhydrAMINE (BENADRYL) 25 MG tablet Take 12.5 mg by mouth at bedtime.    Marland Kitchen escitalopram (LEXAPRO) 5 MG tablet TAKE ONE TABLET BY MOUTH ONE TIME DAILY  90 tablet 0  . metFORMIN (GLUCOPHAGE) 500 MG tablet TAKE ONE TABLET BY MOUTH TWICE A DAY WITH MEALS 180 tablet 0  . Multiple Vitamin (MULTIVITAMIN WITH MINERALS) TABS tablet Take 1 tablet by mouth daily.    Marland Kitchen OVER THE COUNTER MEDICATION Sleep aid    .  Polyethyl Glycol-Propyl Glycol (SYSTANE ULTRA OP) Place 1 drop into both eyes daily as needed (dry eyes).    . pravastatin (PRAVACHOL) 40 MG tablet TAKE ONE TABLET BY MOUTH ONE TIME DAILY  90 tablet 0   No current facility-administered medications on file prior to visit.      3.) Review of functional ability and level of safety:  Any difficulty hearing?  See scanned documentation. Uses hearing aides.  History of falling?  See scanned documentation  Any trouble with IADLs - using a phone, using transportation, grocery shopping, preparing meals, doing housework, doing laundry, taking medications and managing money?  See scanned documentation  Advance Directives?  See scanned documentation.  See summary of recommendations in Patient Instructions below.  4.) Physical Exam There were no vitals filed for this visit. Estimated body mass index is 21.93 kg/m as calculated from the following:   Height as of 11/28/18: 5' 1.5" (1.562 m).   Weight as of 11/28/18: 118 lb (53.5 kg).  EKG (optional): deferred  VITALS per patient if applicable:  GENERAL: alert, oriented, appears well and in no acute distress; visual acuity grossly intact, full vision exam deferred due to pandemic and/or virtual encounter  HEENT: atraumatic,  conjunttiva clear, no obvious abnormalities on inspection of external nose and ears  NECK: normal movements of the head and neck  LUNGS: on inspection no signs of respiratory distress, breathing rate appears normal, no obvious gross SOB, gasping or wheezing  CV: no obvious cyanosis  MS: moves all visible extremities without noticeable abnormality  PSYCH/NEURO: pleasant and cooperative, no obvious depression or anxiety, speech and thought processing grossly intact, Cognitive function grossly intact  Depression screen [facility] 2/9 01/08/2019 11/28/2018 08/16/2018 12/07/2017 12/05/2017  Decreased Interest 0 0 0 0 0  Down, Depressed, Hopeless 0 0 0 0 0  PHQ - 2 Score 0 0 0 0 0  Altered sleeping 0 0 0 - -  Tired, decreased energy 0 0 0 0 -  Change in appetite 0 0 0 0 -  Feeling bad or failure about yourself  0 0 0 0 -  Trouble concentrating 0 0 0 0 -  Moving slowly or fidgety/restless 0 0 0 0 -  Suicidal thoughts 0 0 0 0 -  PHQ-9 Score 0 0 0 - -     See patient instructions for recommendations.  Education and counseling regarding the above review of health provided with a plan for the following: -see scanned patient completed form for further details -fall prevention strategies n/a -healthy lifestyle discussed - encouraged a healthy low sugar diet and regular exercise -see patient instructions below for any other recommendations provided  4)The following written screening schedule of preventive measures were reviewed with assessment and plan made per below, orders and patient instructions:       Alcohol screening done     Obesity Screening and counseling done     STI screening (Hep C if born [birth year]) offered and per pt wishes     Tobacco Screening done       Pneumococcal (PPSV23 -one dose after 64, one before if risk factors), influenza yearly and hepatitis B vaccines (if high risk - end stage renal disease, IV drugs, homosexual men, live in home for mentally retarded, hemophilia  receiving factors) ASSESSMENT/PLAN: done      Screening mammograph (yearly if >40) ASSESSMENT/PLAN:ordered      Screening Pap smear/pelvic exam (q2 years) ASSESSMENT/PLAN: does with gyn      Colorectal cancer screening (FOBT yearly  or flex sig q4y or colonoscopy q10y or barium enema q4y) ASSESSMENT/PLAN: colo guard ordered      Diabetes outpatient self-management training services ASSESSMENT/PLAN: utd or done      Bone mass measurements(covered q2y if indicated - estrogen def, osteoporosis, hyperparathyroid, vertebral abnormalities, osteoporosis or steroids) ASSESSMENT/PLAN: utd or discussed and ordered per pt wishes      Screening for glaucoma(q1y if high risk - diabetes, FH, AA and > 50 or hispanic and > 65) ASSESSMENT/PLAN: utd or advised      Medical nutritional therapy for individuals with diabetes or renal disease ASSESSMENT/PLAN: see orders      Cardiovascular screening blood tests (lipids q5y) ASSESSMENT/PLAN: lipids utd      Diabetes screening tests ASSESSMENT/PLAN: see orders and labs   7.) Summary:   Medicare annual wellness visit, subsequent -risk factors and conditions per above assessment were discussed and treatment, recommendations and referrals were offered per documentation above and orders and patient instructions. -encouraged to complete the cologuard promptly, she agrees -she plans to schedule the dexa and mammo -she is scheduled for her flu shot  Type 2 diabetes mellitus without complication, without long-term current use of insulin ([place]) -due for  Hgba1c - pt agrees to schedule lab visit  Hyperlipidemia, unspecified hyperlipidemia type -stable, UTD, cont treatment  Major depressive disorder with single episode, in full remission ([place]) -well controlled  There are no Patient Instructions on file for this visit.  Lucretia Kern, DO

## 2019-01-10 ENCOUNTER — Other Ambulatory Visit: Payer: Self-pay | Admitting: Family Medicine

## 2019-01-24 ENCOUNTER — Encounter: Payer: Self-pay | Admitting: Gastroenterology

## 2019-02-05 ENCOUNTER — Ambulatory Visit (INDEPENDENT_AMBULATORY_CARE_PROVIDER_SITE_OTHER): Payer: Medicare Other

## 2019-02-05 ENCOUNTER — Other Ambulatory Visit: Payer: Self-pay

## 2019-02-05 ENCOUNTER — Ambulatory Visit: Payer: Medicare Other

## 2019-02-05 DIAGNOSIS — Z23 Encounter for immunization: Secondary | ICD-10-CM

## 2019-04-16 DIAGNOSIS — L820 Inflamed seborrheic keratosis: Secondary | ICD-10-CM | POA: Diagnosis not present

## 2019-04-16 DIAGNOSIS — L821 Other seborrheic keratosis: Secondary | ICD-10-CM | POA: Diagnosis not present

## 2019-04-16 DIAGNOSIS — Z85820 Personal history of malignant melanoma of skin: Secondary | ICD-10-CM | POA: Diagnosis not present

## 2019-04-16 DIAGNOSIS — L812 Freckles: Secondary | ICD-10-CM | POA: Diagnosis not present

## 2019-04-22 ENCOUNTER — Ambulatory Visit: Payer: Medicare PPO | Attending: Internal Medicine

## 2019-04-22 DIAGNOSIS — Z23 Encounter for immunization: Secondary | ICD-10-CM | POA: Insufficient documentation

## 2019-04-22 NOTE — Progress Notes (Signed)
   Covid-19 Vaccination Clinic  Name:  Gabrielle Robinson    MRN: [id number] DOB: 1941/01/06  04/22/2019  [name] was observed post Covid-19 immunization for 15 minutes without incidence. She was provided with Vaccine Information Sheet and instruction to access the V-Safe system.   [name] was instructed to call 911 with any severe reactions post vaccine: Marland Kitchen Difficulty breathing  . Swelling of your face and throat  . A fast heartbeat  . A bad rash all over your body  . Dizziness and weakness    Immunizations Administered    Name Date Dose VIS Date Route   Pfizer COVID-19 Vaccine 04/22/2019  4:01 PM 0.3 mL 03/08/2019 Intramuscular   Manufacturer: [place]   Lot: [id number]   [place]: [id number]

## 2019-04-30 ENCOUNTER — Other Ambulatory Visit: Payer: Self-pay | Admitting: Family Medicine

## 2019-05-10 ENCOUNTER — Ambulatory Visit: Payer: Medicare PPO | Attending: Internal Medicine

## 2019-05-10 DIAGNOSIS — Z23 Encounter for immunization: Secondary | ICD-10-CM | POA: Insufficient documentation

## 2019-05-10 NOTE — Progress Notes (Signed)
   Covid-19 Vaccination Clinic  Name:  Gabrielle Robinson    MRN: [id number] DOB: 1940/10/06  05/10/2019  Gabrielle Robinson was observed post Covid-19 immunization for 15 minutes without incidence. She was provided with Vaccine Information Sheet and instruction to access the V-Safe system.   Gabrielle Robinson was instructed to call 911 with any severe reactions post vaccine: Marland Kitchen Difficulty breathing  . Swelling of your face and throat  . A fast heartbeat  . A bad rash all over your body  . Dizziness and weakness    Immunizations Administered    Name Date Dose VIS Date Route   Pfizer COVID-19 Vaccine 05/10/2019 11:20 AM 0.3 mL 03/08/2019 Intramuscular   Manufacturer: [place]   Lot: [id number]   [place]: [id number]

## 2019-07-02 ENCOUNTER — Telehealth: Payer: Self-pay | Admitting: Oncology

## 2019-07-02 ENCOUNTER — Inpatient Hospital Stay: Payer: Medicare PPO | Attending: Oncology

## 2019-07-02 ENCOUNTER — Other Ambulatory Visit: Payer: Self-pay

## 2019-07-02 DIAGNOSIS — Z85820 Personal history of malignant melanoma of skin: Secondary | ICD-10-CM | POA: Insufficient documentation

## 2019-07-02 DIAGNOSIS — Z79899 Other long term (current) drug therapy: Secondary | ICD-10-CM | POA: Insufficient documentation

## 2019-07-02 DIAGNOSIS — Z7984 Long term (current) use of oral hypoglycemic drugs: Secondary | ICD-10-CM | POA: Diagnosis not present

## 2019-07-02 DIAGNOSIS — C439 Malignant melanoma of skin, unspecified: Secondary | ICD-10-CM

## 2019-07-02 LAB — CBC WITH DIFFERENTIAL (CANCER CENTER ONLY)
Abs Immature Granulocytes: 0.02 10*3/uL (ref 0.00–0.07)
Basophils Absolute: 0.1 10*3/uL (ref 0.0–0.1)
Basophils Relative: 1 %
Eosinophils Absolute: 0.6 10*3/uL — ABNORMAL HIGH (ref 0.0–0.5)
Eosinophils Relative: 8 %
HCT: 41.1 % (ref 36.0–46.0)
Hemoglobin: 13.6 g/dL (ref 12.0–15.0)
Immature Granulocytes: 0 %
Lymphocytes Relative: 25 %
Lymphs Abs: 1.9 10*3/uL (ref 0.7–4.0)
MCH: 29.9 pg (ref 26.0–34.0)
MCHC: 33.1 g/dL (ref 30.0–36.0)
MCV: 90.3 fL (ref 80.0–100.0)
Monocytes Absolute: 0.7 10*3/uL (ref 0.1–1.0)
Monocytes Relative: 10 %
Neutro Abs: 4 10*3/uL (ref 1.7–7.7)
Neutrophils Relative %: 56 %
Platelet Count: 255 10*3/uL (ref 150–400)
RBC: 4.55 MIL/uL (ref 3.87–5.11)
RDW: 11.8 % (ref 11.5–15.5)
WBC Count: 7.3 10*3/uL (ref 4.0–10.5)
nRBC: 0 % (ref 0.0–0.2)

## 2019-07-02 LAB — CMP (CANCER CENTER ONLY)
ALT: 17 U/L (ref 0–44)
AST: 17 U/L (ref 15–41)
Albumin: 3.8 g/dL (ref 3.5–5.0)
Alkaline Phosphatase: 92 U/L (ref 38–126)
Anion gap: 12 (ref 5–15)
BUN: 11 mg/dL (ref 8–23)
CO2: 27 mmol/L (ref 22–32)
Calcium: 9.5 mg/dL (ref 8.9–10.3)
Chloride: 98 mmol/L (ref 98–111)
Creatinine: 0.97 mg/dL (ref 0.44–1.00)
GFR, Est AFR Am: >60 mL/min (ref >60)
GFR, Estimated: 56 mL/min — ABNORMAL LOW (ref >60)
Glucose, Bld: 307 mg/dL — ABNORMAL HIGH (ref 70–99)
Potassium: 4.1 mmol/L (ref 3.5–5.1)
Sodium: 137 mmol/L (ref 135–145)
Total Bilirubin: 0.4 mg/dL (ref 0.3–1.2)
Total Protein: 7.3 g/dL (ref 6.5–8.1)

## 2019-07-02 NOTE — Telephone Encounter (Signed)
Reschedule office visit until after scan per phone call to MD. Gabrielle Robinson pt a print out of appt calendar.

## 2019-07-09 ENCOUNTER — Ambulatory Visit: Payer: Medicare Other | Admitting: Oncology

## 2019-07-10 ENCOUNTER — Ambulatory Visit (HOSPITAL_COMMUNITY): Admission: RE | Admit: 2019-07-10 | Payer: Medicare PPO | Source: Ambulatory Visit

## 2019-07-12 ENCOUNTER — Inpatient Hospital Stay: Payer: Medicare PPO | Admitting: Oncology

## 2019-07-22 ENCOUNTER — Encounter (HOSPITAL_COMMUNITY)
Admission: RE | Admit: 2019-07-22 | Discharge: 2019-07-22 | Disposition: A | Discharge status: Home / Self Care | Payer: Medicare PPO | Source: Ambulatory Visit | Attending: Oncology | Admitting: Oncology

## 2019-07-22 ENCOUNTER — Other Ambulatory Visit: Payer: Self-pay

## 2019-07-22 DIAGNOSIS — I700 Atherosclerosis of aorta: Secondary | ICD-10-CM | POA: Insufficient documentation

## 2019-07-22 DIAGNOSIS — I517 Cardiomegaly: Secondary | ICD-10-CM | POA: Insufficient documentation

## 2019-07-22 DIAGNOSIS — J329 Chronic sinusitis, unspecified: Secondary | ICD-10-CM | POA: Insufficient documentation

## 2019-07-22 DIAGNOSIS — C439 Malignant melanoma of skin, unspecified: Secondary | ICD-10-CM

## 2019-07-22 LAB — GLUCOSE, CAPILLARY: Glucose-Capillary: 195 mg/dL — ABNORMAL HIGH (ref 70–99)

## 2019-07-22 MED ORDER — FLUDEOXYGLUCOSE F - 18 (FDG) INJECTION: 6.2000 | Freq: Once | INTRAVENOUS | Status: DC | PRN

## 2019-07-26 ENCOUNTER — Other Ambulatory Visit: Payer: Self-pay

## 2019-07-26 ENCOUNTER — Inpatient Hospital Stay (HOSPITAL_BASED_OUTPATIENT_CLINIC_OR_DEPARTMENT_OTHER): Payer: Medicare PPO | Admitting: Oncology

## 2019-07-26 VITALS — BP 144/84 | HR 88 | Temp 98.3°F | Resp 20 | Ht 61.5 in | Wt 123.5 lb

## 2019-07-26 DIAGNOSIS — C439 Malignant melanoma of skin, unspecified: Secondary | ICD-10-CM | POA: Diagnosis not present

## 2019-07-26 DIAGNOSIS — Z7984 Long term (current) use of oral hypoglycemic drugs: Secondary | ICD-10-CM | POA: Diagnosis not present

## 2019-07-26 DIAGNOSIS — Z79899 Other long term (current) drug therapy: Secondary | ICD-10-CM | POA: Diagnosis not present

## 2019-07-26 DIAGNOSIS — Z85820 Personal history of malignant melanoma of skin: Secondary | ICD-10-CM | POA: Diagnosis not present

## 2019-07-26 NOTE — Progress Notes (Signed)
Hematology and Oncology Follow Up Visit  Gabrielle Robinson [id number] 11-Mar-1941 79 y.o. 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  She was found to have T2N1 disease, 1 out of 2 lymph node involvement with less than 0.1 mm aggregate without any evidence of metastatic disease.   Prior Therapy:  She is a status post wide excision and sentinel lymph node sampling completed and February 08, 2018.  Current therapy: Active surveillance   Interim History: Gabrielle Robinson is here for a follow-up visit.  Since the last visit, she reports no major changes in her health.  She denies any recent skin rashes or lesions.  She denies any constitutional symptoms of weight loss or appetite changes.  Performance status and quality of life remain excellent.   Medications: Unchanged on review. Current Outpatient Medications  Medication Sig Dispense Refill  . Biotin 5 MG TABS Take 5 mg by mouth daily.    . Calcium Acetate, Phos Binder, (CALCIUM ACETATE PO) Take 1 tablet by mouth daily.    . Cyanocobalamin (VITAMIN B-12 PO) Take by mouth.    . diphenhydrAMINE (BENADRYL) 25 MG tablet Take 12.5 mg by mouth at bedtime.    Marland Kitchen escitalopram (LEXAPRO) 5 MG tablet TAKE ONE TABLET BY MOUTH ONE TIME DAILY  90 tablet 0  . metFORMIN (GLUCOPHAGE) 500 MG tablet TAKE ONE TABLET BY MOUTH TWICE A DAY WITH MEALS  180 tablet 0  . Multiple Vitamin (MULTIVITAMIN WITH MINERALS) TABS tablet Take 1 tablet by mouth daily.    Marland Kitchen OVER THE COUNTER MEDICATION Sleep aid    . Polyethyl Glycol-Propyl Glycol (SYSTANE ULTRA OP) Place 1 drop into both eyes daily as needed (dry eyes).    . pravastatin (PRAVACHOL) 40 MG tablet TAKE ONE TABLET BY MOUTH ONE TIME DAILY  90 tablet 0   No current facility-administered medications for this visit.   Facility-Administered Medications Ordered in Other Visits  Medication Dose Route Frequency  Provider Last Rate Last Admin  . fludeoxyglucose F - 18 (FDG) injection 6.2 millicurie  6.2 millicurie Intravenous Once PRN Sherryl Barters, MD         Allergies: No Known Allergies      Physical Exam:  Blood pressure (!) 144/84, pulse 88, temperature 98.3 F (36.8 C), temperature source Temporal, resp. rate 20, height 5' 1.5" (1.562 m), weight 123 lb 8 oz (56 kg), SpO2 98 %.   ECOG:     General appearance: Alert, awake without any distress. Head: Atraumatic without abnormalities Oropharynx: Without any thrush or ulcers. Eyes: No scleral icterus. Lymph nodes: No lymphadenopathy noted in the cervical, supraclavicular, or axillary nodes Heart:regular rate and rhythm, without any murmurs or gallops.   Robinson: Clear to auscultation without any rhonchi, wheezes or dullness to percussion. Abdomin: Soft, nontender without any shifting dullness or ascites. Musculoskeletal: No clubbing or cyanosis. Neurological: No motor or sensory deficits. Skin: No rashes or lesions.      Lab Results: Lab Results  Component Value Date   WBC 7.3 07/02/2019   HGB 13.6 07/02/2019   HCT 41.1 07/02/2019   MCV 90.3 07/02/2019   PLT 255 07/02/2019     Chemistry      Component Value Date/Time   NA 137 07/02/2019 0924   K 4.1 07/02/2019 0924   CL 98 07/02/2019 0924   CO2 27 07/02/2019 0924   BUN 11 07/02/2019 0924   CREATININE 0.97  07/02/2019 0924      Component Value Date/Time   CALCIUM 9.5 07/02/2019 0924   ALKPHOS 92 07/02/2019 0924   AST 17 07/02/2019 0924   ALT 17 07/02/2019 0924   BILITOT 0.4 07/02/2019 0924      EXAM: NUCLEAR MEDICINE PET WHOLE BODY  TECHNIQUE: 6.07 mCi F-18 FDG was injected intravenously. Full-ring PET imaging was performed from the skull base to thigh after the radiotracer. CT data was obtained and used for attenuation correction and anatomic localization.  Fasting blood glucose: 159 mg/dl  COMPARISON:  PET-CT 04/25/2018    IMPRESSION: 1.  No findings of active malignancy. 2. Chronic paranasal sinusitis. 3.  Aortic Atherosclerosis (ICD10-I70.0). 4. Mild cardiomegaly.   Impression and Plan:   79 year old woman with:  1.  Cutaneous melanoma of the thigh diagnosed in 2019.  She was found to have T2a N1 disease.  She is currently on active surveillance without any evidence of to suggest relapsed disease.  The natural course of this disease and risk of relapse was assessed at this time.  PET CT scan obtained on April 26 was personally reviewed and showed no evidence of metastatic disease.  Treatment options for advanced melanoma was reviewed today in detail in case she develops progression of disease in the future.  These options were included Based therapy oral targeted therapy for harbors BRAF mutation.  I have recommended continued active surveillance at this time and repeat imaging studies in 10 months.  She is agreeable with this plan.  2.  Dermatology surveillance: I recommended continued active follow-up at this time.  4.  Age-appropriate cancer screening: He is up-to-date at this time.   5.  Follow-up: In 10 months after repeat imaging studies.  30  minutes were dedicated to this visit. The time was spent on reviewing laboratory data, imaging studies, discussing treatment options, nd answering questions regarding future plan.     Zola Button, MD 4/30/20213:21 PM

## 2019-07-29 ENCOUNTER — Telehealth: Payer: Self-pay | Admitting: Oncology

## 2019-07-29 NOTE — Telephone Encounter (Signed)
Scheduled appt per 4/30 los.  Was not able to get in contact with the pt. Called and someone will answer and then the phone will hang up.  Sent a message to HIM pool to get a calendar mailed out.

## 2019-08-12 ENCOUNTER — Other Ambulatory Visit: Payer: Self-pay | Admitting: Family Medicine

## 2019-08-28 DIAGNOSIS — L820 Inflamed seborrheic keratosis: Secondary | ICD-10-CM | POA: Diagnosis not present

## 2019-08-28 DIAGNOSIS — L570 Actinic keratosis: Secondary | ICD-10-CM | POA: Diagnosis not present

## 2019-08-28 DIAGNOSIS — L821 Other seborrheic keratosis: Secondary | ICD-10-CM | POA: Diagnosis not present

## 2019-08-28 DIAGNOSIS — Z85820 Personal history of malignant melanoma of skin: Secondary | ICD-10-CM | POA: Diagnosis not present

## 2019-08-28 DIAGNOSIS — L814 Other melanin hyperpigmentation: Secondary | ICD-10-CM | POA: Diagnosis not present

## 2019-08-28 DIAGNOSIS — D225 Melanocytic nevi of trunk: Secondary | ICD-10-CM | POA: Diagnosis not present

## 2019-11-11 ENCOUNTER — Other Ambulatory Visit: Payer: Self-pay | Admitting: Family Medicine

## 2019-11-28 LAB — COLOGUARD

## 2019-12-31 DIAGNOSIS — M545 Low back pain, unspecified: Secondary | ICD-10-CM | POA: Diagnosis not present

## 2019-12-31 DIAGNOSIS — S32010A Wedge compression fracture of first lumbar vertebra, initial encounter for closed fracture: Secondary | ICD-10-CM | POA: Diagnosis not present

## 2019-12-31 DIAGNOSIS — G8929 Other chronic pain: Secondary | ICD-10-CM | POA: Diagnosis not present

## 2020-02-05 ENCOUNTER — Other Ambulatory Visit: Payer: Self-pay

## 2020-02-05 ENCOUNTER — Ambulatory Visit: Payer: Medicare PPO

## 2020-02-25 ENCOUNTER — Ambulatory Visit (INDEPENDENT_AMBULATORY_CARE_PROVIDER_SITE_OTHER): Payer: Medicare PPO

## 2020-02-25 ENCOUNTER — Other Ambulatory Visit: Payer: Self-pay

## 2020-02-25 DIAGNOSIS — Z0000 Encounter for general adult medical examination without abnormal findings: Secondary | ICD-10-CM

## 2020-02-25 NOTE — Patient Instructions (Addendum)
Gabrielle Robinson , Thank you for taking time to come for your Medicare Wellness Visit. I appreciate your ongoing commitment to your health goals. Please review the following plan we discussed and let me know if I can assist you in the future.   Screening recommendations/referrals: Colonoscopy: No longer required Mammogram: No longer required  Bone Density: Up to date, next due 01/06/2021 Recommended yearly ophthalmology/optometry visit for glaucoma screening and checkup Recommended yearly dental visit for hygiene and checkup  Vaccinations: Influenza vaccine: Currently due you may receive at your next office visit  Pneumococcal vaccine: Completed series  Tdap vaccine: Up to date, next due 12/05/2017 Shingles vaccine: Currently due for Shingrix, we recommend that you receive this at your local pharmacy     Advanced directives: Advance directive discussed with you today. Even though you declined this today please call our office should you change your mind and we can give you the proper paperwork for you to fill out.   Conditions/risks identified: None   Next appointment: 02/28/2020 @ 1:30 PM with Dr. Ethlyn Gallery    Preventive Care 65 Years and Older, Female Preventive care refers to lifestyle choices and visits with your health care provider that can promote health and wellness. What does preventive care include?  A yearly physical exam. This is also called an annual well check.  Dental exams once or twice a year.  Routine eye exams. Ask your health care provider how often you should have your eyes checked.  Personal lifestyle choices, including:  Daily care of your teeth and gums.  Regular physical activity.  Eating a healthy diet.  Avoiding tobacco and drug use.  Limiting alcohol use.  Practicing safe sex.  Taking low-dose aspirin every day.  Taking vitamin and mineral supplements as recommended by your health care provider. What happens during an annual well check? The  services and screenings done by your health care provider during your annual well check will depend on your age, overall health, lifestyle risk factors, and family history of disease. Counseling  Your health care provider may ask you questions about your:  Alcohol use.  Tobacco use.  Drug use.  Emotional well-being.  Home and relationship well-being.  Sexual activity.  Eating habits.  History of falls.  Memory and ability to understand (cognition).  Work and work Statistician.  Reproductive health. Screening  You may have the following tests or measurements:  Height, weight, and BMI.  Blood pressure.  Lipid and cholesterol levels. These may be checked every 5 years, or more frequently if you are over 7 years old.  Skin check.  Lung cancer screening. You may have this screening every year starting at age 62 if you have a 30-pack-year history of smoking and currently smoke or have quit within the past 15 years.  Fecal occult blood test (FOBT) of the stool. You may have this test every year starting at age 80.  Flexible sigmoidoscopy or colonoscopy. You may have a sigmoidoscopy every 5 years or a colonoscopy every 10 years starting at age 32.  Hepatitis C blood test.  Hepatitis B blood test.  Sexually transmitted disease (STD) testing.  Diabetes screening. This is done by checking your blood sugar (glucose) after you have not eaten for a while (fasting). You may have this done every 1-3 years.  Bone density scan. This is done to screen for osteoporosis. You may have this done starting at age 51.  Mammogram. This may be done every 1-2 years. Talk to your health care provider  about how often you should have regular mammograms. Talk with your health care provider about your test results, treatment options, and if necessary, the need for more tests. Vaccines  Your health care provider may recommend certain vaccines, such as:  Influenza vaccine. This is recommended  every year.  Tetanus, diphtheria, and acellular pertussis (Tdap, Td) vaccine. You may need a Td booster every 10 years.  Zoster vaccine. You may need this after age 33.  Pneumococcal 13-valent conjugate (PCV13) vaccine. One dose is recommended after age 39.  Pneumococcal polysaccharide (PPSV23) vaccine. One dose is recommended after age 33. Talk to your health care provider about which screenings and vaccines you need and how often you need them. This information is not intended to replace advice given to you by your health care provider. Make sure you discuss any questions you have with your health care provider. Document Released: 04/10/2015 Document Revised: 12/02/2015 Document Reviewed: 01/13/2015 Elsevier Interactive Patient Education  2017 [employer] Prevention in the Home Falls can cause injuries. They can happen to people of all ages. There are many things you can do to make your home safe and to help prevent falls. What can I do on the outside of my home?  Regularly fix the edges of walkways and driveways and fix any cracks.  Remove anything that might make you trip as you walk through a door, such as a raised step or threshold.  Trim any bushes or trees on the path to your home.  Use bright outdoor lighting.  Clear any walking paths of anything that might make someone trip, such as rocks or tools.  Regularly check to see if handrails are loose or broken. Make sure that both sides of any steps have handrails.  Any raised decks and porches should have guardrails on the edges.  Have any leaves, snow, or ice cleared regularly.  Use sand or salt on walking paths during winter.  Clean up any spills in your garage right away. This includes oil or grease spills. What can I do in the bathroom?  Use night lights.  Install grab bars by the toilet and in the tub and shower. Do not use towel bars as grab bars.  Use non-skid mats or decals in the tub or shower.  If  you need to sit down in the shower, use a plastic, non-slip stool.  Keep the floor dry. Clean up any water that spills on the floor as soon as it happens.  Remove soap buildup in the tub or shower regularly.  Attach bath mats securely with double-sided non-slip rug tape.  Do not have throw rugs and other things on the floor that can make you trip. What can I do in the bedroom?  Use night lights.  Make sure that you have a light by your bed that is easy to reach.  Do not use any sheets or blankets that are too big for your bed. They should not hang down onto the floor.  Have a firm chair that has side arms. You can use this for support while you get dressed.  Do not have throw rugs and other things on the floor that can make you trip. What can I do in the kitchen?  Clean up any spills right away.  Avoid walking on wet floors.  Keep items that you use a lot in easy-to-reach places.  If you need to reach something above you, use a strong step stool that has a grab bar.  Keep electrical cords out of the way.  Do not use floor polish or wax that makes floors slippery. If you must use wax, use non-skid floor wax.  Do not have throw rugs and other things on the floor that can make you trip. What can I do with my stairs?  Do not leave any items on the stairs.  Make sure that there are handrails on both sides of the stairs and use them. Fix handrails that are broken or loose. Make sure that handrails are as long as the stairways.  Check any carpeting to make sure that it is firmly attached to the stairs. Fix any carpet that is loose or worn.  Avoid having throw rugs at the top or bottom of the stairs. If you do have throw rugs, attach them to the floor with carpet tape.  Make sure that you have a light switch at the top of the stairs and the bottom of the stairs. If you do not have them, ask someone to add them for you. What else can I do to help prevent falls?  Wear shoes  that:  Do not have high heels.  Have rubber bottoms.  Are comfortable and fit you well.  Are closed at the toe. Do not wear sandals.  If you use a stepladder:  Make sure that it is fully opened. Do not climb a closed stepladder.  Make sure that both sides of the stepladder are locked into place.  Ask someone to hold it for you, if possible.  Clearly mark and make sure that you can see:  Any grab bars or handrails.  First and last steps.  Where the edge of each step is.  Use tools that help you move around (mobility aids) if they are needed. These include:  Canes.  Walkers.  Scooters.  Crutches.  Turn on the lights when you go into a dark area. Replace any light bulbs as soon as they burn out.  Set up your furniture so you have a clear path. Avoid moving your furniture around.  If any of your floors are uneven, fix them.  If there are any pets around you, be aware of where they are.  Review your medicines with your doctor. Some medicines can make you feel dizzy. This can increase your chance of falling. Ask your doctor what other things that you can do to help prevent falls. This information is not intended to replace advice given to you by your health care provider. Make sure you discuss any questions you have with your health care provider. Document Released: 01/08/2009 Document Revised: 08/20/2015 Document Reviewed: 04/18/2014 Elsevier Interactive Patient Education  2017 [employer].

## 2020-02-25 NOTE — Progress Notes (Signed)
Subjective:   Gabrielle Robinson is a 79 y.o. female who presents for Medicare Annual (Subsequent) preventive examination.  I connected with Relena Ivancic  today by telephone and verified that I am speaking with the correct person using two identifiers. Location patient: home Location provider: work Persons participating in the virtual visit: patient, provider.   I discussed the limitations, risks, security and privacy concerns of performing an evaluation and management service by telephone and the availability of in person appointments. I also discussed with the patient that there may be a patient responsible charge related to this service. The patient expressed understanding and verbally consented to this telephonic visit.    Interactive audio and video telecommunications were attempted between this provider and patient, however failed, due to patient having technical difficulties OR patient did not have access to video capability.  We continued and completed visit with audio only.      Review of Systems    N/A  Cardiac Risk Factors include: diabetes mellitus;advanced age (>59men, >24 women);hypertension     Objective:    Today's Vitals   There is no height or weight on file to calculate BMI.  Advanced Directives 02/25/2020 01/30/2018 12/05/2017 12/05/2017 09/17/2014  Does Patient Have a Medical Advance Directive? No No No No No  Would patient like information on creating a medical advance directive? No - Patient declined Yes (MAU/Ambulatory/Procedural Areas - Information given) - - Yes - Educational materials given    Current Medications (verified) Outpatient Encounter Medications as of 02/25/2020  Medication Sig  . Biotin 5 MG TABS Take 5 mg by mouth daily.  . Calcium Acetate, Phos Binder, (CALCIUM ACETATE PO) Take 1 tablet by mouth daily.  . Cyanocobalamin (VITAMIN B-12 PO) Take by mouth.  . diphenhydrAMINE (BENADRYL) 25 MG tablet Take 12.5 mg by mouth at bedtime.  Marland Kitchen escitalopram  (LEXAPRO) 5 MG tablet TAKE ONE TABLET BY MOUTH ONE TIME DAILY  . metFORMIN (GLUCOPHAGE) 500 MG tablet TAKE ONE TABLET BY MOUTH TWICE DAILY WITH MEALS  . Multiple Vitamin (MULTIVITAMIN WITH MINERALS) TABS tablet Take 1 tablet by mouth daily.  Marland Kitchen OVER THE COUNTER MEDICATION Sleep aid  . Polyethyl Glycol-Propyl Glycol (SYSTANE ULTRA OP) Place 1 drop into both eyes daily as needed (dry eyes).  . pravastatin (PRAVACHOL) 40 MG tablet TAKE ONE TABLET BY MOUTH ONE TIME DAILY   No facility-administered encounter medications on file as of 02/25/2020.    Allergies (verified) Patient has no known allergies.   History: Past Medical History:  Diagnosis Date  . Cancer ([place])    Left Hip Melanoma  . Diabetes mellitus without complication ([place])   . Hyperlipemia 01/05/2009   Qualifier: Diagnosis of  By: Arnoldo Morale MD, Balinda Quails   . Major depressive disorder with single episode, in full remission ([place]) 05/28/2015   Past Surgical History:  Procedure Laterality Date  . MELANOMA EXCISION Left 02/08/2018   Procedure: WIDE LOCAL EXCISION WITH ADVANCEMENT FLAP CLOSURE OF LEFT HIP MELANOMA ERAS PATHWAY;  Surgeon: [name], MD;  Location: [facility];  Service: General;  Laterality: Left;  . SENTINEL NODE BIOPSY Left 02/08/2018   Procedure: LEFT INGUINAL SENTINEL LYMPH NODE BIOPSY;  Surgeon: [name], MD;  Location: [facility];  Service: General;  Laterality: Left;   Family History  Problem Relation Age of Onset  . Diabetes Mellitus II Mother   . Diabetes Mellitus II Father   . Melanoma Sister        vaginal  . Breast cancer Maternal Grandmother   .  Renal cancer Maternal Grandfather    Social History   Socioeconomic History  . Marital status: Married    Spouse name: Not on file  . Number of children: Not on file  . Years of education: Not on file  . Highest education level: Not on file  Occupational History  . Not on file  Tobacco Use  . Smoking status: Never Smoker  . Smokeless tobacco: Never Used    Vaping Use  . Vaping Use: Never used  Substance and Sexual Activity  . Alcohol use: Yes    Alcohol/week: 21.0 standard drinks    Types: 21 Glasses of wine per week    Comment: occ  . Drug use: No  . Sexual activity: Not on file  Other Topics Concern  . 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 Difficulty of Paying Living Expenses: Not hard at all  Food Insecurity: No Food Insecurity  . Worried About Charity fundraiser in the Last Year: Never true  . Ran Out of Food in the Last Year: Never true  Transportation Needs: No Transportation Needs  . Lack of Transportation (Medical): No  . Lack of Transportation (Non-Medical): No  Physical Activity: Insufficiently Active  . Days of Exercise per Week: 5 days  . Minutes of Exercise per Session: 20 min  Stress: No Stress Concern Present  . Feeling of Stress : Not at all  Social Connections: Moderately Integrated  . Frequency of Communication with Friends and Family: More than three times a week  . Frequency of Social Gatherings with Friends and Family: More than three times a week  . Attends Religious Services: More than 4 times per year  . Active Member of Clubs or Organizations: No  . Attends Archivist Meetings: Never  . Marital Status: Married    Tobacco Counseling Counseling given: Not Answered   Clinical Intake:  Pre-visit preparation completed: Yes  Pain : No/denies pain     Nutritional Risks: Nausea/ vomitting/ diarrhea (Has occassional diarrhea) Diabetes: Yes CBG done?: No Did pt. bring in CBG monitor from home?: No Glucose Meter Downloaded?: No  How often do you need to have someone help you when you read  instructions, pamphlets, or other written materials from your doctor or pharmacy?: 1 - Never What is the last grade level you completed in school?: College  Diabetic?Yes Nutrition Risk Assessment:  Has the patient had any N/V/D within the last 2 months?  No  Does the patient have any non-healing wounds?  No  Has the patient had any unintentional weight loss or weight gain?  No   Diabetes:  Is the patient diabetic?  Yes  If diabetic, was a CBG obtained today?  No  Did the patient bring in their glucometer from home?  No  How often do you monitor your CBG's? Patient states does not check blood sugars at home .   Financial Strains and Diabetes Management:  Are you having any financial strains with the device, your supplies or your medication? No .  Does the patient want to be seen by Chronic Care Management for management of their diabetes?  No  Would the patient like to be referred to a Nutritionist or for Diabetic Management?  No   Diabetic Exams:  Diabetic Eye Exam: Overdue for diabetic eye exam. Pt has been advised about the importance in completing this exam. Patient advised to call and schedule an eye exam. Diabetic Foot Exam: Overdue, Pt has been advised about the importance in completing this exam. Pt is scheduled for diabetic foot exam on 02/28/2020.   Interpreter Needed?: No  Information entered by :: [place] of Daily Living In your present state of health, do you have any difficulty performing the following activities: 02/25/2020  Hearing? Y  Comment Has bilateral hearing aids  Vision? N  Difficulty concentrating or making decisions? N  Walking or climbing stairs? N  Dressing or bathing? N  Doing errands, shopping? N  Preparing Food and eating ? N  Using the Toilet? N  In the past six months, have you accidently leaked urine? N  Do you have problems with loss of bowel control? Y  Managing your Medications? N  Managing your Finances? N    Housekeeping or managing your Housekeeping? N  Some recent data might be hidden    Patient Care Team: [name], MD as PCP - General (Family Medicine) Wyatt Portela, MD as Consulting Physician (Oncology) Jarome Matin, MD as Consulting Physician (Dermatology)  Indicate any recent Medical Services you may have received from other than [facility] providers in the past year (date may be approximate).     Assessment:   This is a routine wellness examination for Braelyn.  Hearing/Vision screen  Hearing Screening   125Hz  250Hz  500Hz  1000Hz  2000Hz  3000Hz  4000Hz  6000Hz  8000Hz   Right ear:           Left ear:           Vision Screening Comments: Patient stated has not had her eyes examined in a while. Plans to get eye exam in January 2022  Dietary issues and exercise activities discussed: Current Exercise Habits: Home exercise routine, Type of exercise: walking, Time (Minutes): 20, Frequency (Times/Week): 7, Weekly Exercise (Minutes/Week): 140, Intensity: Mild  Goals    . Exercise 150 min/wk Moderate Activity     Call parks and recreation the women's walking group  Or join club       Depression Screen PHQ 2/9 Scores 02/25/2020 01/08/2019 11/28/2018 08/16/2018 12/07/2017 12/05/2017 07/20/2017  PHQ - 2 Score 0 0 0 0 0 0 0  PHQ- 9 Score 0 0 0 0 - - 2    Fall Risk Fall Risk  02/25/2020 01/08/2019 11/28/2018 12/05/2017 12/05/2017  Falls in the past year? 1 0 0 No No  Number falls in past yr: 0 0 0 - -  Injury with Fall? 0 0 0 - -  Risk for fall due to : History of fall(s) - - - -  Follow up Falls evaluation completed;Falls prevention discussed - - - -    Any stairs in or around the home? Yes  If so, are there any without handrails? No  Home free of loose throw rugs in walkways, pet beds, electrical cords, etc? Yes  Adequate lighting in your home to reduce risk of falls? Yes   ASSISTIVE DEVICES UTILIZED TO PREVENT FALLS:  Life alert? No  Use of a cane, walker or w/c? No  Grab bars in  the bathroom? No  Shower chair or bench in shower? No  Elevated toilet seat or a handicapped toilet? No  Cognitive Function: MMSE - Mini Mental State Exam 12/05/2017  Not completed: (No Data)        Immunizations Immunization History  Administered Date(s) Administered  . Fluad Quad(high Dose 65+) 02/05/2019  . Influenza Split 12/26/2012  . Influenza Whole 01/09/2008, 01/01/2010  . Influenza, High Dose Seasonal PF 03/10/2014, 12/29/2014, 03/07/2016, 12/05/2017  . PFIZER SARS-COV-2 Vaccination 04/22/2019, 05/10/2019  . Pneumococcal Conjugate-13 09/17/2014  . Pneumococcal Polysaccharide-23 01/01/2010  . Td 10/29/2007, 12/05/2017, 12/05/2017    TDAP status: Up to date Flu Vaccine status: Declined, Education has been provided regarding the importance of this vaccine but patient still declined. Advised may receive this vaccine at local pharmacy or Health Dept. Aware to provide a copy of the vaccination record if obtained from local pharmacy or Health Dept. Verbalized acceptance and understanding. Pneumococcal vaccine status: Up to date Covid-19 vaccine status: Completed vaccines  Qualifies for Shingles Vaccine? Yes   Zostavax completed No   Shingrix Completed?: No.    Education has been provided regarding the importance of this vaccine. Patient has been advised to call insurance company to determine out of pocket expense if they have not yet received this vaccine. Advised may also receive vaccine at local pharmacy or Health Dept. Verbalized acceptance and understanding.  Screening Tests Health Maintenance  Topic Date Due  . OPHTHALMOLOGY EXAM  07/25/2015  . HEMOGLOBIN A1C  02/27/2019  . URINE MICROALBUMIN  08/28/2019  . INFLUENZA VACCINE  10/27/2019  . FOOT EXAM  11/29/2019  . TETANUS/TDAP  12/06/2027  . DEXA SCAN  Completed  . COVID-19 Vaccine  Completed  . PNA vac Low Risk Adult  Completed    Health Maintenance  Health Maintenance Due  Topic Date Due  .  OPHTHALMOLOGY EXAM  07/25/2015  . HEMOGLOBIN A1C  02/27/2019  . URINE MICROALBUMIN  08/28/2019  . INFLUENZA VACCINE  10/27/2019  . FOOT EXAM  11/29/2019    Colorectal cancer screening: No longer required.  Mammogram status: No longer required.  Bone Density status: Completed 01/07/2016. Results reflect: Bone density results: OSTEOPENIA. Repeat every 5 years.  Lung Cancer Screening: (Low Dose CT Chest recommended if Age 64-80 years, 30 pack-year currently smoking OR have quit w/in 15years.) does not qualify.   Lung Cancer Screening Referral: N/A   Additional Screening:  Hepatitis C Screening: does not qualify;   Vision Screening: Recommended annual ophthalmology exams for early detection of glaucoma and other disorders of the eye. Is the patient up to date with their annual eye exam?  No  Who is the provider or what is the name of the office in which the patient attends annual eye exams? Has an eye doctor but can not remember her name If pt is not established with a provider, would they like to be referred to a provider to establish care? No .   Dental Screening: Recommended annual dental exams for proper oral hygiene  Community Resource Referral / Chronic Care Management: CRR required this visit?  No   CCM required this visit?  No      Plan:     I have personally reviewed and noted the following in the patient's chart:   . Medical and social history . Use of alcohol, tobacco or illicit drugs  . Current medications and supplements . Functional ability and status . Nutritional status . Physical activity . Advanced directives . List of other physicians . Hospitalizations, surgeries, and ER visits in previous 12 months . Vitals . Screenings to include cognitive, depression, and falls .  Referrals and appointments  In addition, I have reviewed and discussed with patient certain preventive protocols, quality metrics, and best practice recommendations. A written  personalized care plan for preventive services as well as general preventive health recommendations were provided to patient.     Ofilia Neas, LPN   02/[id number]   Nurse Notes: None

## 2020-02-28 ENCOUNTER — Other Ambulatory Visit: Payer: Self-pay

## 2020-02-28 ENCOUNTER — Ambulatory Visit: Payer: Medicare PPO | Admitting: Family Medicine

## 2020-02-28 ENCOUNTER — Encounter: Payer: Self-pay | Admitting: Family Medicine

## 2020-02-28 VITALS — BP 144/80 | HR 65 | Temp 97.4°F | Ht 61.5 in | Wt 118.8 lb

## 2020-02-28 DIAGNOSIS — R030 Elevated blood-pressure reading, without diagnosis of hypertension: Secondary | ICD-10-CM | POA: Diagnosis not present

## 2020-02-28 DIAGNOSIS — E1169 Type 2 diabetes mellitus with other specified complication: Secondary | ICD-10-CM | POA: Diagnosis not present

## 2020-02-28 DIAGNOSIS — R197 Diarrhea, unspecified: Secondary | ICD-10-CM

## 2020-02-28 DIAGNOSIS — E785 Hyperlipidemia, unspecified: Secondary | ICD-10-CM | POA: Diagnosis not present

## 2020-02-28 DIAGNOSIS — F339 Major depressive disorder, recurrent, unspecified: Secondary | ICD-10-CM | POA: Diagnosis not present

## 2020-02-28 DIAGNOSIS — E119 Type 2 diabetes mellitus without complications: Secondary | ICD-10-CM | POA: Diagnosis not present

## 2020-02-28 NOTE — Progress Notes (Signed)
Gabrielle Robinson DOB: 06/25/1940 Encounter date: 02/28/2020  This is a 79 y.o. female who presents with Chief Complaint  Patient presents with  . Diarrhea    History of present illness:  Quit taking the metformin awhile ago. Didn't feel like eating with it was helpful; just always had diarrhea. Wouldn't know when it would come. Trying to avoid triggers. Not aware of all triggers.   Sometimes can go a week without the loose stools. Pretty urgent when she needs to go; sometimes single episode; sometimes more than one. Not getting a lot of abd discomfort with episodes. When she has looser stools they are dark, skinny, expolosive. Sometimes stools are black; but usually just very dark. No issues with reflux. No blood in stools.   Mood has been ok; but thinks with stress of holidays she will need to go back on these.   Stopped taking the pravastatin as well. Just wanted to see if she did better without medication. Has been off this for maybe 9 months.    No Known Allergies Current Meds  Medication Sig  . Biotin 5 MG TABS Take 5 mg by mouth daily.  . Calcium Acetate, Phos Binder, (CALCIUM ACETATE PO) Take 1 tablet by mouth daily.  . Cyanocobalamin (VITAMIN B-12 PO) Take by mouth.  . diphenhydrAMINE (BENADRYL) 25 MG tablet Take 12.5 mg by mouth at bedtime.  . Multiple Vitamin (MULTIVITAMIN WITH MINERALS) TABS tablet Take 1 tablet by mouth daily.  Marland Kitchen OVER THE COUNTER MEDICATION Sleep aid  . Polyethyl Glycol-Propyl Glycol (SYSTANE ULTRA OP) Place 1 drop into both eyes daily as needed (dry eyes).  . [DISCONTINUED] escitalopram (LEXAPRO) 5 MG tablet TAKE ONE TABLET BY MOUTH ONE TIME DAILY  . [DISCONTINUED] metFORMIN (GLUCOPHAGE) 500 MG tablet TAKE ONE TABLET BY MOUTH TWICE DAILY WITH MEALS  . [DISCONTINUED] pravastatin (PRAVACHOL) 40 MG tablet TAKE ONE TABLET BY MOUTH ONE TIME DAILY    Review of Systems  Constitutional: Negative for chills, fatigue and fever.  Respiratory: Negative for cough,  chest tightness, shortness of breath and wheezing.   Cardiovascular: Negative for chest pain, palpitations and leg swelling.  Gastrointestinal: Positive for diarrhea. Negative for abdominal distention, abdominal pain, constipation, nausea and vomiting.  Genitourinary: Negative for decreased urine volume, difficulty urinating, dysuria, hematuria and urgency.    Objective:  BP (!) 144/80 (BP Location: Left Arm, Patient Position: Sitting, Cuff Size: Normal)   Pulse 65   Temp (!) 97.4 F (36.3 C) (Oral)   Ht 5' 1.5" (1.562 m)   Wt 118 lb 12.8 oz (53.9 kg)   BMI 22.08 kg/m   Weight: 118 lb 12.8 oz (53.9 kg)   BP Readings from Last 3 Encounters:  02/28/20 (!) 144/80  07/26/19 (!) 144/84  11/28/18 130/64   Wt Readings from Last 3 Encounters:  02/28/20 118 lb 12.8 oz (53.9 kg)  07/26/19 123 lb 8 oz (56 kg)  11/28/18 118 lb (53.5 kg)    Physical Exam Constitutional:      General: She is not in acute distress.    Appearance: She is well-developed.  Cardiovascular:     Rate and Rhythm: Normal rate and regular rhythm.     Heart sounds: Normal heart sounds. No murmur heard.  No friction rub.  Pulmonary:     Effort: Pulmonary effort is normal. No respiratory distress.     Breath sounds: Normal breath sounds. No wheezing or rales.  Abdominal:     General: Bowel sounds are normal.  Tenderness: There is no abdominal tenderness. There is no right CVA tenderness or left CVA tenderness.  Musculoskeletal:     Right lower leg: No edema.     Left lower leg: No edema.  Neurological:     Mental Status: She is alert and oriented to person, place, and time.  Psychiatric:        Behavior: Behavior normal.     Assessment/Plan  1. Diarrhea, unspecified type She did not complete prior Cologuard, but has 1 at home now that she can repeat.  I encouraged her to check expiration date illness.  We will check some blood work today, have her collect a Cologuard, and determine further evaluation  pending this.  We can find alternative to Metformin since this seemed to worsen her loose stools, but I am going to recheck A1c to see where sugars have been before starting any new medications.  2. Elevated blood pressure reading Blood pressure elevated on recheck as well.  I encouraged her to check at home and report back numbers to me when we call with blood work results. - CBC with Differential/Platelet; Future - Comprehensive metabolic panel; Future  3. Type 2 diabetes mellitus without complication, without long-term current use of insulin ([facility]) We will recheck A1c; unable to tolerate Metformin. - Hemoglobin A1c; Future - Microalbumin / creatinine urine ratio; Future  4. Depression, recurrent ([place]) Has done well on Lexapro in the past.  She did stop this feels she should restart, especially during winter/holiday season.  5. Hyperlipidemia associated with type 2 diabetes mellitus ([place]) Has been diet controlled.  Will consider treatment pending A1c and lipid results. - Lipid panel; Future    Return for pending labs.    Micheline Rough, MD

## 2020-03-02 MED ORDER — ESCITALOPRAM OXALATE 5 MG PO TABS: 5.0000 mg | ORAL_TABLET | Freq: Every day | ORAL | 0 refills | Status: DC

## 2020-04-10 ENCOUNTER — Other Ambulatory Visit: Payer: Self-pay

## 2020-04-10 ENCOUNTER — Inpatient Hospital Stay: Payer: Medicare PPO | Attending: Oncology

## 2020-04-10 DIAGNOSIS — Z85820 Personal history of malignant melanoma of skin: Secondary | ICD-10-CM | POA: Diagnosis not present

## 2020-04-10 DIAGNOSIS — C439 Malignant melanoma of skin, unspecified: Secondary | ICD-10-CM

## 2020-04-10 LAB — CMP (CANCER CENTER ONLY)
ALT: 16 U/L (ref 0–44)
AST: 15 U/L (ref 15–41)
Albumin: 3.9 g/dL (ref 3.5–5.0)
Alkaline Phosphatase: 109 U/L (ref 38–126)
Anion gap: 10 (ref 5–15)
BUN: 13 mg/dL (ref 8–23)
CO2: 27 mmol/L (ref 22–32)
Calcium: 9.9 mg/dL (ref 8.9–10.3)
Chloride: 102 mmol/L (ref 98–111)
Creatinine: 1.2 mg/dL — ABNORMAL HIGH (ref 0.44–1.00)
GFR, Estimated: 46 mL/min — ABNORMAL LOW (ref >60)
Glucose, Bld: 239 mg/dL — ABNORMAL HIGH (ref 70–99)
Potassium: 5.1 mmol/L (ref 3.5–5.1)
Sodium: 139 mmol/L (ref 135–145)
Total Bilirubin: 0.7 mg/dL (ref 0.3–1.2)
Total Protein: 7.9 g/dL (ref 6.5–8.1)

## 2020-04-10 LAB — CBC WITH DIFFERENTIAL (CANCER CENTER ONLY)
Abs Immature Granulocytes: 0.01 10*3/uL (ref 0.00–0.07)
Basophils Absolute: 0.1 10*3/uL (ref 0.0–0.1)
Basophils Relative: 1 %
Eosinophils Absolute: 0.4 10*3/uL (ref 0.0–0.5)
Eosinophils Relative: 6 %
HCT: 46.1 % — ABNORMAL HIGH (ref 36.0–46.0)
Hemoglobin: 15.3 g/dL — ABNORMAL HIGH (ref 12.0–15.0)
Immature Granulocytes: 0 %
Lymphocytes Relative: 38 %
Lymphs Abs: 2.5 10*3/uL (ref 0.7–4.0)
MCH: 29.8 pg (ref 26.0–34.0)
MCHC: 33.2 g/dL (ref 30.0–36.0)
MCV: 89.7 fL (ref 80.0–100.0)
Monocytes Absolute: 0.6 10*3/uL (ref 0.1–1.0)
Monocytes Relative: 9 %
Neutro Abs: 3 10*3/uL (ref 1.7–7.7)
Neutrophils Relative %: 46 %
Platelet Count: 255 10*3/uL (ref 150–400)
RBC: 5.14 MIL/uL — ABNORMAL HIGH (ref 3.87–5.11)
RDW: 12.2 % (ref 11.5–15.5)
WBC Count: 6.6 10*3/uL (ref 4.0–10.5)
nRBC: 0 % (ref 0.0–0.2)

## 2020-04-10 LAB — LACTATE DEHYDROGENASE: LDH: 150 U/L (ref 98–192)

## 2020-04-17 ENCOUNTER — Inpatient Hospital Stay: Payer: Medicare PPO | Admitting: Oncology

## 2020-04-17 ENCOUNTER — Telehealth: Payer: Self-pay

## 2020-04-17 NOTE — Telephone Encounter (Signed)
Patient was supposed to have a PET scan around 04/10/20 but was never contacted by [employer]. Called Central Scheduling and per representative patient was not contacted to schedule scan. PET scheduled for 04/29/20 at 1:00. Patient is aware to arrive 30 minutes early and NPO 6 hours prior to scan and no carbs 24 hours prior to scan. Patient verbalized understanding. Dr. Alen Blew made aware and today's appointment with Dr. Alen Blew canceled. Scheduling message sent to reschedule patient with Dr. Alen Blew after 04/30/20.

## 2020-04-20 ENCOUNTER — Telehealth: Payer: Self-pay | Admitting: Oncology

## 2020-04-20 NOTE — Telephone Encounter (Signed)
Scheduled appt per 1/21 sch msg- pt husband is aware of appt .

## 2020-04-29 ENCOUNTER — Other Ambulatory Visit: Payer: Self-pay

## 2020-04-29 ENCOUNTER — Ambulatory Visit (HOSPITAL_COMMUNITY)
Admission: RE | Admit: 2020-04-29 | Discharge: 2020-04-29 | Disposition: A | Discharge status: Home / Self Care | Payer: Medicare PPO | Source: Ambulatory Visit | Attending: Oncology | Admitting: Oncology

## 2020-04-29 DIAGNOSIS — J320 Chronic maxillary sinusitis: Secondary | ICD-10-CM | POA: Diagnosis not present

## 2020-04-29 DIAGNOSIS — M4856XA Collapsed vertebra, not elsewhere classified, lumbar region, initial encounter for fracture: Secondary | ICD-10-CM | POA: Diagnosis not present

## 2020-04-29 DIAGNOSIS — I517 Cardiomegaly: Secondary | ICD-10-CM | POA: Insufficient documentation

## 2020-04-29 DIAGNOSIS — C439 Malignant melanoma of skin, unspecified: Secondary | ICD-10-CM | POA: Diagnosis present

## 2020-04-29 DIAGNOSIS — I700 Atherosclerosis of aorta: Secondary | ICD-10-CM | POA: Insufficient documentation

## 2020-04-29 LAB — GLUCOSE, CAPILLARY: Glucose-Capillary: 248 mg/dL — ABNORMAL HIGH (ref 70–99)

## 2020-04-29 MED ORDER — FLUDEOXYGLUCOSE F - 18 (FDG) INJECTION
5.9000 | Freq: Once | INTRAVENOUS | Status: AC | PRN
  Administered 2020-04-29: 5.9 via INTRAVENOUS

## 2020-05-07 ENCOUNTER — Inpatient Hospital Stay: Payer: Medicare PPO | Attending: Oncology | Admitting: Oncology

## 2020-05-07 ENCOUNTER — Other Ambulatory Visit: Payer: Self-pay

## 2020-05-07 VITALS — BP 138/83 | HR 73 | Temp 97.8°F | Resp 20 | Ht 61.0 in | Wt 120.1 lb

## 2020-05-07 DIAGNOSIS — Z79899 Other long term (current) drug therapy: Secondary | ICD-10-CM | POA: Diagnosis not present

## 2020-05-07 DIAGNOSIS — C439 Malignant melanoma of skin, unspecified: Secondary | ICD-10-CM

## 2020-05-07 DIAGNOSIS — Z85820 Personal history of malignant melanoma of skin: Secondary | ICD-10-CM | POA: Diagnosis not present

## 2020-05-07 NOTE — Progress Notes (Signed)
Hematology and Oncology Follow Up Visit  Gabrielle Robinson [id number] February 04, 1941 80 y.o. 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    Prior Therapy:  She is a status post wide excision and sentinel lymph node sampling completed and February 08, 2018.  Current therapy: Active surveillance   Interim History: [name] presents today for repeat evaluation.  Since her last visit, she reports no major changes in her health.  She denies any recent skin rashes or lesions.  She denies any lymphadenopathy or petechiae.  Her performance status quality of life remained excellent.   Medications: Updated on review. Current Outpatient Medications  Medication Sig Dispense Refill  . Biotin 5 MG TABS Take 5 mg by mouth daily.    . Calcium Acetate, Phos Binder, (CALCIUM ACETATE PO) Take 1 tablet by mouth daily.    . Cyanocobalamin (VITAMIN B-12 PO) Take by mouth.    . diphenhydrAMINE (BENADRYL) 25 MG tablet Take 12.5 mg by mouth at bedtime.    Marland Kitchen escitalopram (LEXAPRO) 5 MG tablet Take 1 tablet (5 mg total) by mouth daily. 90 tablet 0  . Multiple Vitamin (MULTIVITAMIN WITH MINERALS) TABS tablet Take 1 tablet by mouth daily.    Marland Kitchen OVER THE COUNTER MEDICATION Sleep aid    . Polyethyl Glycol-Propyl Glycol (SYSTANE ULTRA OP) Place 1 drop into both eyes daily as needed (dry eyes).     No current facility-administered medications for this visit.     Allergies: No Known Allergies      Physical Exam: Blood pressure 138/83, pulse 73, temperature 97.8 F (36.6 C), temperature source Tympanic, resp. rate 20, height 5' 1"  (1.549 m), weight 120 lb 1.6 oz (54.5 kg), SpO2 98 %.     ECOG: 1    General appearance: Comfortable appearing without any discomfort Head: Normocephalic without any trauma Oropharynx: Mucous membranes are moist and pink without any thrush or  ulcers. Eyes: Pupils are equal and round reactive to light. Lymph nodes: No cervical, supraclavicular, inguinal or axillary lymphadenopathy.   Heart:regular rate and rhythm.  S1 and S2 without leg edema. Lung: Clear without any rhonchi or wheezes.  No dullness to percussion. Abdomin: Soft, nontender, nondistended with good bowel sounds.  No hepatosplenomegaly. Musculoskeletal: No joint deformity or effusion.  Full range of motion noted. Neurological: No deficits noted on motor, sensory and deep tendon reflex exam. Skin: No skin lesions or rashes.  Incision site appears well-healed.       Lab Results: Lab Results  Component Value Date   WBC 6.6 04/10/2020   HGB 15.3 (H) 04/10/2020   HCT 46.1 (H) 04/10/2020   MCV 89.7 04/10/2020   PLT 255 04/10/2020     Chemistry      Component Value Date/Time   NA 139 04/10/2020 1028   K 5.1 04/10/2020 1028   CL 102 04/10/2020 1028   CO2 27 04/10/2020 1028   BUN 13 04/10/2020 1028   CREATININE 1.20 (H) 04/10/2020 1028      Component Value Date/Time   CALCIUM 9.9 04/10/2020 1028   ALKPHOS 109 04/10/2020 1028   AST 15 04/10/2020 1028   ALT 16 04/10/2020 1028   BILITOT 0.7 04/10/2020 1028      IMPRESSION: 1. No findings of active malignancy. Presumed postoperative site along the left proximal upper thigh posteriorly continues to demonstrate only very low-level activity compatible with expected postoperative appearance. 2. Other  imaging findings of potential clinical significance: Chronic bilateral ethmoid and maxillary sinusitis. Aortic Atherosclerosis (ICD10-I70.0). Mild cardiomegaly. Chronic superior endplate compressions at L1 and L2.  Impression and Plan:   79 spinal fusion clinical management-year-old woman with:  1.  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  She is status post surgical resection and that did not receive any additional therapy.  Her disease status was updated at this time and  treatment options were reviewed.  PET scan obtained on February 2 did not show any evidence of metastatic disease.  At this time I recommended continued active surveillance and treat with immunotherapy or BRAF targeted therapy if she harbors appropriate mutation in case of disease relapse.  At this time I do not recommend any further imaging studies and will follow clinically annually.   2.  Dermatology surveillance: She continues to follow with dermatology which I recommended continuing for the time being.  4.  Age-appropriate cancer screening: She continues to be up-to-date at this time.   5.  Follow-up: She will return on an 1 year for repeat follow-up.  30  minutes were spent on this encounter.  The time was dedicated to reviewing her disease status, treatment options and future plan of care review.     Zola Button, MD 2/10/20223:44 PM

## 2020-07-20 IMAGING — PT NM PET TUM IMG INITIAL (PI) WHOLE BODY
1 of 7 series · 3 of 25 positions shown · non-contrast
Comparison: None.

CLINICAL DATA: Initial treatment strategy for metastatic melanoma
along the left hip.

EXAM:
NUCLEAR MEDICINE PET WHOLE BODY
TECHNIQUE: 6.4 mCi F-18 FDG was injected intravenously. Full-ring PET imaging
was performed from the skull base to thigh after the radiotracer. CT
data was obtained and used for attenuation correction and anatomic
localization.
Fasting blood glucose: 141 mg/dl

[Series 4: ct wb 5.0 b31f · axial · 5.0mm · 0.98mm/px · z∈[+2,+822]mm · 3 of 411 slices shown]
[im 103/411  soft-tissue]
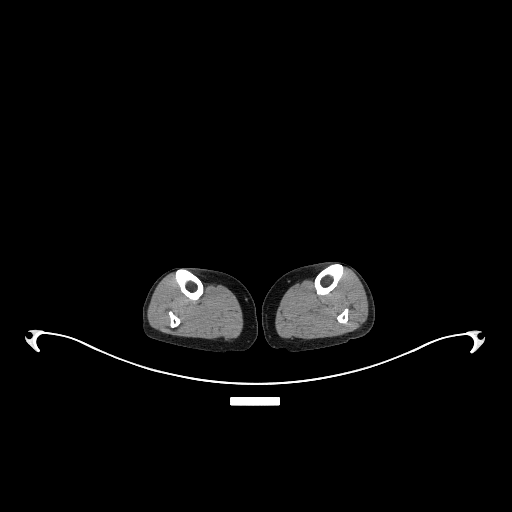
[im 206/411  soft-tissue]
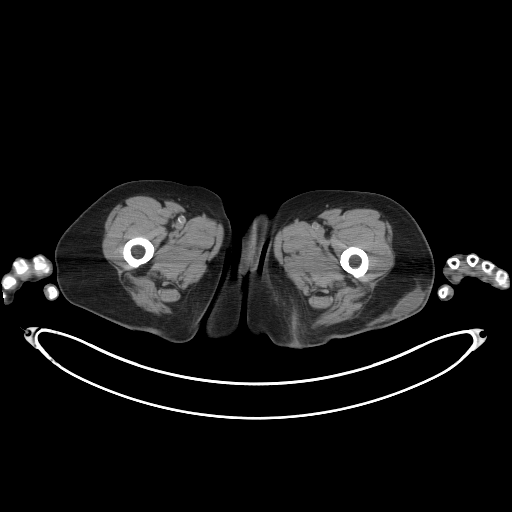
[im 308/411  soft-tissue]
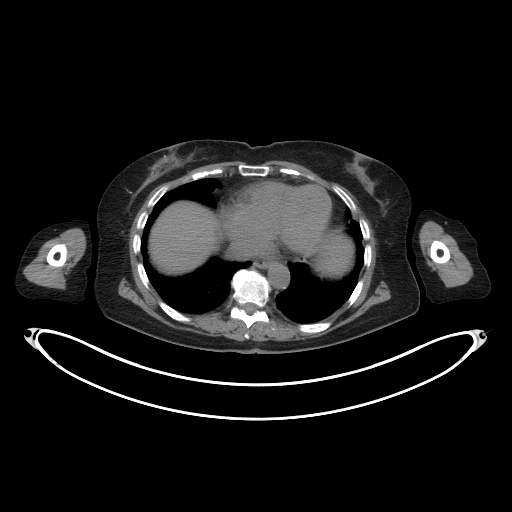

[3 of 25 positions shown; findings below may reference images not displayed]

FINDINGS: Mediastinal blood pool activity: SUV max

HEAD/NECK: No significant abnormal hypermetabolic activity in this
region.

Incidental CT findings: Chronic right ethmoid and bilateral
maxillary sinusitis. Mild bilateral common carotid atherosclerotic
calcification.

CHEST: No significant abnormal hypermetabolic activity in this
region.

Incidental CT findings: Atherosclerotic calcification of the
thoracic aorta.

ABDOMEN/PELVIS: Accentuated activity in various segments of bowel,
primarily colon, without correlatives CT findings, probably
physiologic in nature.

Incidental CT findings: Aortoiliac atherosclerotic vascular disease.
Postoperative findings along the left groin.

SKELETON: No significant abnormal hypermetabolic activity in this
region.

Incidental CT findings: Mesoacromial os acromiale on the right.

EXTREMITIES: No significant abnormal hypermetabolic activity in this
region.

Incidental CT findings: Along the left lower buttock and upper thigh
region posterolaterally, a 1.5 by 5.7 by 8.4 cm fluid density
structure is present with only faint metabolic activity, likely
postoperative.
IMPRESSION: 1. No current findings of active melanoma.
2. Along the left lower buttock and upper thigh region
posterolaterally, a small subcutaneous collection is likely
postoperative.
3.  Aortic Atherosclerosis ([id number]).
4. Chronic paranasal sinusitis.

## 2020-10-12 DIAGNOSIS — Z85820 Personal history of malignant melanoma of skin: Secondary | ICD-10-CM | POA: Diagnosis not present

## 2020-10-12 DIAGNOSIS — D485 Neoplasm of uncertain behavior of skin: Secondary | ICD-10-CM | POA: Diagnosis not present

## 2020-10-12 DIAGNOSIS — C44329 Squamous cell carcinoma of skin of other parts of face: Secondary | ICD-10-CM | POA: Diagnosis not present

## 2020-11-17 DIAGNOSIS — C44329 Squamous cell carcinoma of skin of other parts of face: Secondary | ICD-10-CM | POA: Diagnosis not present

## 2020-11-17 DIAGNOSIS — Z85820 Personal history of malignant melanoma of skin: Secondary | ICD-10-CM | POA: Diagnosis not present

## 2020-11-17 DIAGNOSIS — Z85828 Personal history of other malignant neoplasm of skin: Secondary | ICD-10-CM | POA: Diagnosis not present

## 2021-01-21 ENCOUNTER — Telehealth: Payer: Self-pay

## 2021-01-21 NOTE — Telephone Encounter (Signed)
Gabrielle Robinson's husband called wanting to know if [name] can transfer to Dr Yong Channel. Doreene Forrey is her husband and is currently a pt of Dr Yong Channel. [name] would like to come to the same doctor as her husband. Can we scheduled a TOC?

## 2021-01-21 NOTE — Telephone Encounter (Signed)
Fine with me-I am accepting family members of current patient's

## 2021-01-28 NOTE — Telephone Encounter (Signed)
Pt is scheduled for TOC.

## 2021-02-09 ENCOUNTER — Encounter: Payer: Self-pay | Admitting: Family Medicine

## 2021-02-09 ENCOUNTER — Other Ambulatory Visit: Payer: Self-pay

## 2021-02-09 ENCOUNTER — Telehealth (INDEPENDENT_AMBULATORY_CARE_PROVIDER_SITE_OTHER): Payer: Medicare PPO | Admitting: Family Medicine

## 2021-02-09 DIAGNOSIS — R197 Diarrhea, unspecified: Secondary | ICD-10-CM

## 2021-02-09 NOTE — Patient Instructions (Signed)
-  try no dairy (mild, cheese, butter, ice cream, whey, etc) for 3-4 weeks  -probiotic daily - such as Align or Culturelle  -schedule follow up in office in 3-4 weeks   I hope you are feeling better soon!  Seek in person care promptly if your symptoms worsen, new concerns arise or you are not improving with treatment.  It was nice to see you today! I help Gabrielle Robinson out with telemedicine visits on Tuesdays and Thursdays and am available for visits on those days. If you have any concerns or questions following this visit please schedule a follow up visit with your Primary Care doctor or seek care at a local urgent care clinic to avoid delays in care.

## 2021-02-09 NOTE — Progress Notes (Signed)
Virtual Visit via Telephone Note  I connected with Gabrielle Robinson on 02/09/21 at 10:40 AM EST by telephone and verified that I am speaking with the correct person using two identifiers.   I discussed the limitations, risks, security and privacy concerns of performing an evaluation and management service by telephone and the availability of in person appointments. I also discussed with the patient that there may be a patient responsible charge related to this service. The patient expressed understanding and agreed to proceed.  Location patient: home, [place] Location provider: work or home office Participants present for the call: patient, provider Patient did not have a visit with me in the prior 7 days to address this/these issue(s).   History of Present Illness:  Acute telemedicine visit for diarrhea: -Onset: a few months -Symptoms include:some stool urgency and diarrhea at times; occurs about every 2 weeks - usually one episode of diarrhea when occurs; normal bowels between episodes -Denies: blood in the stools, weight loss, vomiting, nausea -Pertinent past medical history: Past Medical History:  Diagnosis Date   Cancer ([place])    Left Hip Melanoma   Diabetes mellitus without complication ([place])    Hyperlipemia 01/05/2009   Qualifier: Diagnosis of  By: Arnoldo Morale MD, Balinda Quails    Major depressive disorder with single episode, in full remission ([place]) 05/28/2015  -Pertinent medication allergies: No Known Allergies  Observations/Objective: Patient sounds cheerful and well on the phone. I do not appreciate any SOB. Speech and thought processing are grossly intact. Patient reported vitals:  Assessment and Plan:  Diarrhea, unspecified type  -Gabrielle Robinson is delightful. We discussed possible serious and likely etiologies, options for evaluation and workup, limitations of telemedicine visit vs in person visit, treatment, treatment risks and precautions. Pt prefers to treat via telemedicine empirically rather  than in person at this moment. Symptoms are happening about 1-2x per month. Opted to try trial off dairy for a few weeks along with a probiotic with close follow up in person in 3-4 weeks. Advised may need to see a gastroenterologist if persists.  Scheduled follow up with PCP offered:  Sent message to schedulers to assist and advised patient to contact PCP office to schedule if does not receive call back in next 24 hours. Advised to seek prompt in person care if worsening, new symptoms arise, or if is not improving with treatment. Advised of options for inperson care in case PCP office not available. Did let the patient know that I only do telemedicine shifts for [place] on Tuesdays and Thursdays and advised a follow up visit with PCP or at an [facility] if has further questions or concerns.   Follow Up Instructions:  I did not refer this patient for an OV with me in the next 24 hours for this/these issue(s).  I discussed the assessment and treatment plan with the patient. The patient was provided an opportunity to ask questions and all were answered. The patient agreed with the plan and demonstrated an understanding of the instructions.   I spent 14 minutes on the date of this visit in the care of this patient. See summary of tasks completed to properly care for this patient in the detailed notes above which also included counseling of above, review of PMH, medications, allergies, evaluation of the patient and ordering and/or  instructing patient on testing and care options.     Lucretia Kern, DO

## 2021-02-12 ENCOUNTER — Ambulatory Visit: Payer: Medicare PPO | Admitting: Family Medicine

## 2021-02-17 ENCOUNTER — Ambulatory Visit: Payer: Medicare PPO | Admitting: Family Medicine

## 2021-02-17 VITALS — BP 128/100 | HR 98 | Temp 98.6°F | Wt 114.4 lb

## 2021-02-17 DIAGNOSIS — I10 Essential (primary) hypertension: Secondary | ICD-10-CM

## 2021-02-17 DIAGNOSIS — R5383 Other fatigue: Secondary | ICD-10-CM | POA: Diagnosis not present

## 2021-02-17 DIAGNOSIS — R197 Diarrhea, unspecified: Secondary | ICD-10-CM | POA: Diagnosis not present

## 2021-02-17 DIAGNOSIS — E1169 Type 2 diabetes mellitus with other specified complication: Secondary | ICD-10-CM

## 2021-02-17 DIAGNOSIS — E559 Vitamin D deficiency, unspecified: Secondary | ICD-10-CM | POA: Diagnosis not present

## 2021-02-17 NOTE — Patient Instructions (Signed)
He should schedule an appointment in the next few weeks with your PCP to follow-up on your blood pressure and blood sugar.

## 2021-02-17 NOTE — Progress Notes (Signed)
Subjective:    Patient ID: Gabrielle Robinson, female    DOB: 1940/06/13, 80 y.o.   MRN: [id number]  Chief Complaint  Patient presents with   Constipation    Spoke with Dr Maudie Mercury and was told to take [place] and probitics, states feels like a new person    HPI Patient was seen today for f/u on diarrhea and fatigue.  Pt seen virtually on 02/09/21, advised to start probiotic.  Started [place] yogurt which has helped.  Symptoms have nearly resolved.  Pt stopped taking DM meds as felt she no longer needed them.  Also drinking little water daily.  Per chart review pt has a h/o loose stools and elevated bp.  Past Medical History:  Diagnosis Date   Cancer ([place])    Left Hip Melanoma   Diabetes mellitus without complication ([place])    Hyperlipemia 01/05/2009   Qualifier: Diagnosis of  By: Arnoldo Morale MD, Balinda Quails    Major depressive disorder with single episode, in full remission ([place]) 05/28/2015    No Known Allergies  ROS General: Denies fever, chills, night sweats, changes in weight, changes in appetite  +fatigue HEENT: Denies headaches, ear pain, changes in vision, rhinorrhea, sore throat CV: Denies CP, palpitations, SOB, orthopnea Pulm: Denies SOB, cough, wheezing GI: Denies abdominal pain, nausea, vomiting, constipation +diarrhea GU: Denies dysuria, hematuria, frequency, vaginal discharge Msk: Denies muscle cramps, joint pains Neuro: Denies weakness, numbness, tingling Skin: Denies rashes, bruising Psych: Denies depression, anxiety, hallucinations    Objective:    Blood pressure (!) 128/100, pulse 98, temperature 98.6 F (37 C), temperature source Oral, weight 114 lb 6.4 oz (51.9 kg), SpO2 95 %.  Gen. Pleasant, well-nourished, in no distress, normal affect   HEENT: [place]/AT, face symmetric, conjunctiva clear, no scleral icterus, PERRLA, EOMI, nares patent without drainage Lungs: no accessory muscle use, CTAB, no wheezes or rales Cardiovascular: RRR, no m/r/g, no peripheral edema Abdomen: BS  present, soft, NT/ND, no hepatosplenomegaly. Musculoskeletal: No deformities, no cyanosis or clubbing, normal tone Neuro:  A&Ox3, CN II-XII intact, normal gait Skin:  Warm, no lesions/ rash   Wt Readings from Last 3 Encounters:  02/17/21 114 lb 6.4 oz (51.9 kg)  05/07/20 120 lb 1.6 oz (54.5 kg)  02/28/20 118 lb 12.8 oz (53.9 kg)    Lab Results  Component Value Date   WBC 6.6 04/10/2020   HGB 15.3 (H) 04/10/2020   HCT 46.1 (H) 04/10/2020   PLT 255 04/10/2020   GLUCOSE 239 (H) 04/10/2020   CHOL 199 08/28/2018   TRIG 148.0 08/28/2018   HDL 74.20 08/28/2018   LDLDIRECT 162.3 01/01/2010   LDLCALC 95 08/28/2018   ALT 16 04/10/2020   AST 15 04/10/2020   NA 139 04/10/2020   K 5.1 04/10/2020   CL 102 04/10/2020   CREATININE 1.20 (H) 04/10/2020   BUN 13 04/10/2020   CO2 27 04/10/2020   TSH 1.68 09/13/2013   HGBA1C 6.9 (H) 08/28/2018   MICROALBUR 7.2 (H) 08/28/2018    Assessment/Plan:  Essential hypertension  -uncontrolled -will obtain labs -Advised on need to start medication.  Pt declines at this time. -Discussed the importance of lifestyle modifications -Patient advised to schedule follow-up appointment with PCP in 1-2 weeks for CPE -Check BP at home and keep a log to bring with you to clinic - Plan: CMP  Type 2 diabetes mellitus with other specified complication, without long-term current use of insulin ([place])  -Hemoglobin A1c 6.9% on 08/28/2018 -Advised on importance of glycemic control -Discussed likely  need to restart medication based on labs. -Lifestyle modification - Plan: Hemoglobin A1c  Diarrhea, unspecified type  -Improving -Continue activity yogurt -Bland diet, advance as tolerated -Increase p.o. intake of water. -Self-care to decrease stress - Plan: CBC with Differential/Platelet, CMP, TSH, T4, Free  Fatigue, unspecified type  - Plan: TSH, T4, Free, Vitamin D, 25-hydroxy  F/u in 2-4 wks with pcp.    Grier Mitts, MD

## 2021-02-18 LAB — COMPREHENSIVE METABOLIC PANEL
AG Ratio: 1.3 (calc) (ref 1.0–2.5)
ALT: 12 U/L (ref 6–29)
AST: 15 U/L (ref 10–35)
Albumin: 4 g/dL (ref 3.6–5.1)
Alkaline phosphatase (APISO): 90 U/L (ref 37–153)
BUN/Creatinine Ratio: 17 (calc) (ref 6–22)
BUN: 22 mg/dL (ref 7–25)
CO2: 20 mmol/L (ref 20–32)
Calcium: 9 mg/dL (ref 8.6–10.4)
Chloride: 97 mmol/L — ABNORMAL LOW (ref 98–110)
Creat: 1.29 mg/dL — ABNORMAL HIGH (ref 0.60–0.95)
Globulin: 3 g/dL (calc) (ref 1.9–3.7)
Glucose, Bld: 326 mg/dL — ABNORMAL HIGH (ref 65–99)
Potassium: 5.2 mmol/L (ref 3.5–5.3)
Sodium: 134 mmol/L — ABNORMAL LOW (ref 135–146)
Total Bilirubin: 0.4 mg/dL (ref 0.2–1.2)
Total Protein: 7 g/dL (ref 6.1–8.1)

## 2021-02-18 LAB — CBC WITH DIFFERENTIAL/PLATELET
Absolute Monocytes: 757 cells/uL (ref 200–950)
Basophils Absolute: 62 cells/uL (ref 0–200)
Basophils Relative: 0.8 %
Eosinophils Absolute: 320 cells/uL (ref 15–500)
Eosinophils Relative: 4.1 %
HCT: 44 % (ref 35.0–45.0)
Hemoglobin: 14.7 g/dL (ref 11.7–15.5)
Lymphs Abs: 1856 cells/uL (ref 850–3900)
MCH: 30.7 pg (ref 27.0–33.0)
MCHC: 33.4 g/dL (ref 32.0–36.0)
MCV: 91.9 fL (ref 80.0–100.0)
MPV: 9.4 fL (ref 7.5–12.5)
Monocytes Relative: 9.7 %
Neutro Abs: 4805 cells/uL (ref 1500–7800)
Neutrophils Relative %: 61.6 %
Platelets: 258 10*3/uL (ref 140–400)
RBC: 4.79 10*6/uL (ref 3.80–5.10)
RDW: 11.6 % (ref 11.0–15.0)
Total Lymphocyte: 23.8 %
WBC: 7.8 10*3/uL (ref 3.8–10.8)

## 2021-02-18 LAB — T4, FREE: Free T4: 0.9 ng/dL (ref 0.8–1.8)

## 2021-02-18 LAB — HEMOGLOBIN A1C
Hgb A1c MFr Bld: 10.7 % of total Hgb — ABNORMAL HIGH (ref <5.7)
Mean Plasma Glucose: 260 mg/dL
eAG (mmol/L): 14.4 mmol/L

## 2021-02-18 LAB — TSH: TSH: 3.05 mIU/L (ref 0.40–4.50)

## 2021-02-18 LAB — VITAMIN D 25 HYDROXY (VIT D DEFICIENCY, FRACTURES): Vit D, 25-Hydroxy: 20 ng/mL — ABNORMAL LOW (ref 30–100)

## 2021-02-22 ENCOUNTER — Other Ambulatory Visit: Payer: Self-pay | Admitting: Family Medicine

## 2021-02-22 DIAGNOSIS — E559 Vitamin D deficiency, unspecified: Secondary | ICD-10-CM

## 2021-02-22 MED ORDER — VITAMIN D (ERGOCALCIFEROL) 1.25 MG (50000 UNIT) PO CAPS: 50000.0000 [IU] | ORAL_CAPSULE | ORAL | 0 refills | Status: DC

## 2021-02-24 NOTE — Progress Notes (Signed)
ATC patient several times, either no answer or would ring and then sounded like a clicking. Mailed results to address on chart.

## 2021-02-25 ENCOUNTER — Ambulatory Visit (INDEPENDENT_AMBULATORY_CARE_PROVIDER_SITE_OTHER): Payer: Medicare PPO

## 2021-02-25 VITALS — BP 118/68 | Ht 61.0 in | Wt 112.4 lb

## 2021-02-25 DIAGNOSIS — Z0000 Encounter for general adult medical examination without abnormal findings: Secondary | ICD-10-CM | POA: Diagnosis not present

## 2021-02-25 NOTE — Progress Notes (Signed)
Subjective:   Gabrielle Robinson is a 80 y.o. female who presents for Medicare Annual (Subsequent) preventive examination.  Review of Systems    No ROS  Cardiac Risk Factors include: advanced age (>66men, >6 women);diabetes mellitus     Objective:    Today's Vitals   02/25/21 1359  BP: 118/68  Weight: 112 lb 6.4 oz (51 kg)  Height: 5\' 1"  (1.549 m)   Body mass index is 21.24 kg/m.  Advanced Directives 02/25/2021 02/25/2020 01/30/2018 12/05/2017 12/05/2017 09/17/2014  Does Patient Have a Medical Advance Directive? No No No No No No  Would patient like information on creating a medical advance directive? No - Patient declined No - Patient declined Yes (MAU/Ambulatory/Procedural Areas - Information given) - - Yes - Educational materials given    Current Medications (verified) Outpatient Encounter Medications as of 02/25/2021  Medication Sig   Biotin 5 MG TABS Take 5 mg by mouth daily.   Cyanocobalamin (VITAMIN B-12 PO) Take by mouth.   OVER THE COUNTER MEDICATION Sleep aid   Vitamin D, Ergocalciferol, (DRISDOL) 1.25 MG (50000 UNIT) CAPS capsule Take 1 capsule (50,000 Units total) by mouth every 7 (seven) days.   Calcium Acetate, Phos Binder, (CALCIUM ACETATE PO) Take 1 tablet by mouth daily. (Patient not taking: Reported on 02/25/2021)   diphenhydrAMINE (BENADRYL) 25 MG tablet Take 12.5 mg by mouth at bedtime. (Patient not taking: Reported on 02/25/2021)   escitalopram (LEXAPRO) 5 MG tablet Take 1 tablet (5 mg total) by mouth daily. (Patient not taking: Reported on 02/17/2021)   Multiple Vitamin (MULTIVITAMIN WITH MINERALS) TABS tablet Take 1 tablet by mouth daily. (Patient not taking: Reported on 02/17/2021)   Polyethyl Glycol-Propyl Glycol (SYSTANE ULTRA OP) Place 1 drop into both eyes daily as needed (dry eyes). (Patient not taking: Reported on 02/25/2021)   No facility-administered encounter medications on file as of 02/25/2021.    Allergies (verified) Patient has no known allergies.    History: Past Medical History:  Diagnosis Date   Cancer ([place])    Left Hip Melanoma   Diabetes mellitus without complication ([place])    Hyperlipemia 01/05/2009   Qualifier: Diagnosis of  By: Arnoldo Morale MD, Balinda Quails    Major depressive disorder with single episode, in full remission ([place]) 05/28/2015   Past Surgical History:  Procedure Laterality Date   MELANOMA EXCISION Left 02/08/2018   Procedure: WIDE LOCAL EXCISION WITH ADVANCEMENT FLAP CLOSURE OF LEFT HIP MELANOMA ERAS PATHWAY;  Surgeon: [name], MD;  Location: [place];  Service: General;  Laterality: Left;   SENTINEL NODE BIOPSY Left 02/08/2018   Procedure: LEFT INGUINAL SENTINEL LYMPH NODE BIOPSY;  Surgeon: [name], MD;  Location: [place];  Service: General;  Laterality: Left;   Family History  Problem Relation Age of Onset   Diabetes Mellitus II Mother    Diabetes Mellitus II Father    Melanoma Sister        vaginal   Breast cancer Maternal Grandmother    Renal cancer Maternal Grandfather    Social History   Socioeconomic History   Marital status: Married    Spouse name: Not on file   Number of children: Not on file   Years of education: Not on file   Highest education level: Bachelor's degree (e.g., BA, AB, BS)  Occupational History   Not on file  Tobacco Use   Smoking status: Never   Smokeless tobacco: Never  Vaping Use   Vaping Use: Never used  Substance and Sexual Activity  Alcohol use: Yes    Alcohol/week: 21.0 standard drinks    Types: 21 Glasses of wine per week    Comment: occ   Drug use: No   Sexual activity: Not on file  Other Topics Concern   Not on file  Social History Narrative   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      Home Situation: lives with husband and son and two grandchildren      Spiritual Beliefs:       Lifestyle: eating very healthy; some walking      Social Determinants of Health   Financial Resource Strain: Low Risk    Difficulty of Paying  Living Expenses: Not hard at all  Food Insecurity: No Food Insecurity   Worried About Charity fundraiser in the Last Year: Never true   Arboriculturist in the Last Year: Never true  Transportation Needs: No Transportation Needs   Lack of Transportation (Medical): No   Lack of Transportation (Non-Medical): No  Physical Activity: Insufficiently Active   Days of Exercise per Week: 2 days   Minutes of Exercise per Session: 10 min  Stress: No Stress Concern Present   Feeling of Stress : Not at all  Social Connections: Socially Integrated   Frequency of Communication with Friends and Family: More than three times a week   Frequency of Social Gatherings with Friends and Family: More than three times a week   Attends Religious Services: More than 4 times per year   Active Member of [employer] or Organizations: Yes   Attends Music therapist: More than 4 times per year   Marital Status: Married    Clinical Intake:  Pre-visit preparation completed: Yes  Nutrition Risk Assessment:  Has the patient had any N/V/D within the last 2 months?  No. Does the patient have any non-healing wounds?  No  Has the patient had any unintentional weight loss or weight gain?  No   Diabetes:  Is the patient diabetic?  Yes  If diabetic, was a CBG obtained today?  No  Did the patient bring in their glucometer from home?  No  How often do you monitor your CBG's? Daily.   Financial Strains and Diabetes Management:  Are you having any financial strains with the device, your supplies or your medication? No .   Diabetic Exams:  Diabetic Eye Exam: Completed Yes Followed by [facility].  Diabetic Foot Exam: Completed no. Pt has been advised about the importance in completing this exam.     Diabetic? Yes  Interpreter Needed?: No  Activities of Daily Living In your present state of health, do you have any difficulty performing the following activities: 02/25/2021  Hearing? N  Comment No  difficulty hearing  Vision? N  Comment Wears reading glasses  Difficulty concentrating or making decisions? N  Walking or climbing stairs? N  Dressing or bathing? N  Doing errands, shopping? N  Preparing Food and eating ? N  Using the Toilet? N  In the past six months, have you accidently leaked urine? N  Do you have problems with loss of bowel control? N  Managing your Finances? N  Housekeeping or managing your Housekeeping? N  Some recent data might be hidden    Patient Care Team: [name], MD as PCP - General (Family Medicine) Wyatt Portela, MD as Consulting Physician (Oncology) Jarome Matin, MD as Consulting Physician (Dermatology)  Indicate any recent Medical Services you may  have received from other than [facility] providers in the past year (date may be approximate).     Assessment:   This is a routine wellness examination for Suhaila.  Hearing/Vision screen Hearing Screening - Comments:: Wears hearing aids. Vision Screening - Comments:: Wears reading glasses. Followed [facility]  Dietary issues and exercise activities discussed: Current Exercise Habits: Home exercise routine, Type of exercise: stretching, Time (Minutes): 10, Frequency (Times/Week): 2, Weekly Exercise (Minutes/Week): 20, Intensity: Mild   Goals Addressed             This Visit's Progress    Exercise 150 min/wk Moderate Activity       Call parks and recreation the women's walking group  Or join club.       Depression Screen PHQ 2/9 Scores 02/25/2021 02/25/2021 02/17/2021 02/25/2020 01/08/2019 11/28/2018 08/16/2018  PHQ - 2 Score 0 0 0 0 0 0 0  PHQ- 9 Score 0 0 0 0 0 0 0    Fall Risk Fall Risk  02/25/2021 02/17/2021 02/13/2021 02/25/2020 01/08/2019  Falls in the past year? 0 0 0 1 0  Number falls in past yr: 0 - - 0 0  Injury with Fall? 0 - - 0 0  Risk for fall due to : - - - History of fall(s) -  Follow up - - - Falls evaluation completed;Falls prevention discussed -     FALL RISK PREVENTION PERTAINING TO THE HOME:  Any stairs in or around the home? Yes  If so, are there any without handrails? No  Home free of loose throw rugs in walkways, pet beds, electrical cords, etc? Yes  Adequate lighting in your home to reduce risk of falls? Yes   ASSISTIVE DEVICES UTILIZED TO PREVENT FALLS:  Life alert? No  Use of a cane, walker or w/c? No  Grab bars in the bathroom? Yes  Shower chair or bench in shower? No  Elevated toilet seat or a handicapped toilet? No   TIMED UP AND GO:  Was the test performed? Yes .  Length of time to ambulate 10 feet: 5 sec.   Gait steady and fast without use of assistive device  Cognitive Function: MMSE - Mini Mental State Exam 12/05/2017  Not completed: (No Data)     6CIT Screen 02/25/2021  What Year? 0 points  What month? 0 points  What time? 0 points  Count back from 20 0 points  Months in reverse 0 points  Repeat phrase 2 points  Total Score 2    Immunizations Immunization History  Administered Date(s) Administered   Fluad Quad(high Dose 65+) 02/05/2019   Influenza Split 12/26/2012   Influenza Whole 01/09/2008, 01/01/2010   Influenza, High Dose Seasonal PF 03/10/2014, 12/29/2014, 03/07/2016, 12/05/2017   PFIZER(Purple Top)SARS-COV-2 Vaccination 04/22/2019, 05/10/2019   Pneumococcal Conjugate-13 09/17/2014   Pneumococcal Polysaccharide-23 01/01/2010   Td 10/29/2007, 12/05/2017, 12/05/2017    Qualifies for Shingles Vaccine? Yes   Zostavax completed No   Shingrix Completed?: No.    Education has been provided regarding the importance of this vaccine. Patient has been advised to call insurance company to determine out of pocket expense if they have not yet received this vaccine. Advised may also receive vaccine at local pharmacy or Health Dept. Verbalized acceptance and understanding.  Screening Tests Health Maintenance  Topic Date Due   URINE MICROALBUMIN  08/28/2019   FOOT EXAM  11/29/2019   INFLUENZA  VACCINE  03/11/2021 (Originally 10/26/2020)   COVID-19 Vaccine (3 - Pfizer risk series)  03/13/2021 (Originally 06/07/2019)   OPHTHALMOLOGY EXAM  04/11/2021 (Originally 07/25/2015)   Zoster Vaccines- Shingrix (1 of 2) 05/26/2021 (Originally 10/20/1959)   HEMOGLOBIN A1C  08/17/2021   TETANUS/TDAP  12/06/2027   Pneumonia Vaccine 77+ Years old  Completed   DEXA SCAN  Completed   HPV VACCINES  Aged Out    Health Maintenance  Health Maintenance Due  Topic Date Due   URINE MICROALBUMIN  08/28/2019   FOOT EXAM  11/29/2019    Additional Screening:  Vision Screening: Recommended annual ophthalmology exams for early detection of glaucoma and other disorders of the eye. Is the patient up to date with their annual eye exam?  Yes  Who is the provider or what is the name of the office in which the patient attends annual eye exams? Followed by [facility].   Dental Screening: Recommended annual dental exams for proper oral hygiene  Community Resource Referral / Chronic Care Management:  CRR required this visit?  No   CCM required this visit?  No      Plan:     I have personally reviewed and noted the following in the patient's chart:   Medical and social history Use of alcohol, tobacco or illicit drugs  Current medications and supplements including opioid prescriptions. Not currently taking opioids Functional ability and status Nutritional status Physical activity Advanced directives List of other physicians Hospitalizations, surgeries, and ER visits in previous 12 months Vitals Screenings to include cognitive, depression, and falls Referrals and appointments  In addition, I have reviewed and discussed with patient certain preventive protocols, quality metrics, and best practice recommendations. A written personalized care plan for preventive services as well as general preventive health recommendations were provided to patient.     Criselda Peaches, LPN   [id number]

## 2021-02-25 NOTE — Patient Instructions (Addendum)
[name] , Thank you for taking time to come for your Medicare Wellness Visit. I appreciate your ongoing commitment to your health goals. Please review the following plan we discussed and let me know if I can assist you in the future.   These are the goals we discussed:  Goals      Exercise 150 min/wk Moderate Activity     Call parks and recreation the women's walking group  Or join club.        This is a list of the screening recommended for you and due dates:  Health Maintenance  Topic Date Due   Urine Protein Check  08/28/2019   Complete foot exam   11/29/2019   Flu Shot  03/11/2021*   COVID-19 Vaccine (3 - Pfizer risk series) 03/13/2021*   Eye exam for diabetics  04/11/2021*   Zoster (Shingles) Vaccine (1 of 2) 05/26/2021*   Hemoglobin A1C  08/17/2021   Tetanus Vaccine  12/06/2027   Pneumonia Vaccine  Completed   DEXA scan (bone density measurement)  Completed   HPV Vaccine  Aged Out  *Topic was postponed. The date shown is not the original due date.   Advanced directives: No   Conditions/risks identified: None  Next appointment: Follow up in one year for your annual wellness visit    Preventive Care 65 Years and Older, Female Preventive care refers to lifestyle choices and visits with your health care provider that can promote health and wellness. What does preventive care include? A yearly physical exam. This is also called an annual well check. Dental exams once or twice a year. Routine eye exams. Ask your health care provider how often you should have your eyes checked. Personal lifestyle choices, including: Daily care of your teeth and gums. Regular physical activity. Eating a healthy diet. Avoiding tobacco and drug use. Limiting alcohol use. Practicing safe sex. Taking low-dose aspirin every day. Taking vitamin and mineral supplements as recommended by your health care provider. What happens during an annual well check? The services and screenings done  by your health care provider during your annual well check will depend on your age, overall health, lifestyle risk factors, and family history of disease. Counseling  Your health care provider may ask you questions about your: Alcohol use. Tobacco use. Drug use. Emotional well-being. Home and relationship well-being. Sexual activity. Eating habits. History of falls. Memory and ability to understand (cognition). Work and work Statistician. Reproductive health. Screening  You may have the following tests or measurements: Height, weight, and BMI. Blood pressure. Lipid and cholesterol levels. These may be checked every 5 years, or more frequently if you are over 107 years old. Skin check. Lung cancer screening. You may have this screening every year starting at age 27 if you have a 30-pack-year history of smoking and currently smoke or have quit within the past 15 years. Fecal occult blood test (FOBT) of the stool. You may have this test every year starting at age 76. Flexible sigmoidoscopy or colonoscopy. You may have a sigmoidoscopy every 5 years or a colonoscopy every 10 years starting at age 20. Hepatitis C blood test. Hepatitis B blood test. Sexually transmitted disease (STD) testing. Diabetes screening. This is done by checking your blood sugar (glucose) after you have not eaten for a while (fasting). You may have this done every 1-3 years. Bone density scan. This is done to screen for osteoporosis. You may have this done starting at age 87. Mammogram. This may be done every 1-2  years. Talk to your health care provider about how often you should have regular mammograms. Talk with your health care provider about your test results, treatment options, and if necessary, the need for more tests. Vaccines  Your health care provider may recommend certain vaccines, such as: Influenza vaccine. This is recommended every year. Tetanus, diphtheria, and acellular pertussis (Tdap, Td) vaccine. You  may need a Td booster every 10 years. Zoster vaccine. You may need this after age 25. Pneumococcal 13-valent conjugate (PCV13) vaccine. One dose is recommended after age 93. Pneumococcal polysaccharide (PPSV23) vaccine. One dose is recommended after age 44. Talk to your health care provider about which screenings and vaccines you need and how often you need them. This information is not intended to replace advice given to you by your health care provider. Make sure you discuss any questions you have with your health care provider. Document Released: 04/10/2015 Document Revised: 12/02/2015 Document Reviewed: 01/13/2015 Elsevier Interactive Patient Education  2017 [place] Prevention in the Home Falls can cause injuries. They can happen to people of all ages. There are many things you can do to make your home safe and to help prevent falls. What can I do on the outside of my home? Regularly fix the edges of walkways and driveways and fix any cracks. Remove anything that might make you trip as you walk through a door, such as a raised step or threshold. Trim any bushes or trees on the path to your home. Use bright outdoor lighting. Clear any walking paths of anything that might make someone trip, such as rocks or tools. Regularly check to see if handrails are loose or broken. Make sure that both sides of any steps have handrails. Any raised decks and porches should have guardrails on the edges. Have any leaves, snow, or ice cleared regularly. Use sand or salt on walking paths during winter. Clean up any spills in your garage right away. This includes oil or grease spills. What can I do in the bathroom? Use night lights. Install grab bars by the toilet and in the tub and shower. Do not use towel bars as grab bars. Use non-skid mats or decals in the tub or shower. If you need to sit down in the shower, use a plastic, non-slip stool. Keep the floor dry. Clean up any water that spills  on the floor as soon as it happens. Remove soap buildup in the tub or shower regularly. Attach bath mats securely with double-sided non-slip rug tape. Do not have throw rugs and other things on the floor that can make you trip. What can I do in the bedroom? Use night lights. Make sure that you have a light by your bed that is easy to reach. Do not use any sheets or blankets that are too big for your bed. They should not hang down onto the floor. Have a firm chair that has side arms. You can use this for support while you get dressed. Do not have throw rugs and other things on the floor that can make you trip. What can I do in the kitchen? Clean up any spills right away. Avoid walking on wet floors. Keep items that you use a lot in easy-to-reach places. If you need to reach something above you, use a strong step stool that has a grab bar. Keep electrical cords out of the way. Do not use floor polish or wax that makes floors slippery. If you must use wax, use non-skid floor  wax. Do not have throw rugs and other things on the floor that can make you trip. What can I do with my stairs? Do not leave any items on the stairs. Make sure that there are handrails on both sides of the stairs and use them. Fix handrails that are broken or loose. Make sure that handrails are as long as the stairways. Check any carpeting to make sure that it is firmly attached to the stairs. Fix any carpet that is loose or worn. Avoid having throw rugs at the top or bottom of the stairs. If you do have throw rugs, attach them to the floor with carpet tape. Make sure that you have a light switch at the top of the stairs and the bottom of the stairs. If you do not have them, ask someone to add them for you. What else can I do to help prevent falls? Wear shoes that: Do not have high heels. Have rubber bottoms. Are comfortable and fit you well. Are closed at the toe. Do not wear sandals. If you use a stepladder: Make  sure that it is fully opened. Do not climb a closed stepladder. Make sure that both sides of the stepladder are locked into place. Ask someone to hold it for you, if possible. Clearly mark and make sure that you can see: Any grab bars or handrails. First and last steps. Where the edge of each step is. Use tools that help you move around (mobility aids) if they are needed. These include: Canes. Walkers. Scooters. Crutches. Turn on the lights when you go into a dark area. Replace any light bulbs as soon as they burn out. Set up your furniture so you have a clear path. Avoid moving your furniture around. If any of your floors are uneven, fix them. If there are any pets around you, be aware of where they are. Review your medicines with your doctor. Some medicines can make you feel dizzy. This can increase your chance of falling. Ask your doctor what other things that you can do to help prevent falls. This information is not intended to replace advice given to you by your health care provider. Make sure you discuss any questions you have with your health care provider. Document Released: 01/08/2009 Document Revised: 08/20/2015 Document Reviewed: 04/18/2014 Elsevier Interactive Patient Education  2017 [employer].

## 2021-03-04 ENCOUNTER — Other Ambulatory Visit: Payer: Self-pay | Admitting: Family Medicine

## 2021-03-05 ENCOUNTER — Encounter: Payer: Self-pay | Admitting: Family Medicine

## 2021-03-10 NOTE — Addendum Note (Signed)
Addended by: Criselda Peaches on: 03/10/2021 10:38 AM   Modules accepted: Orders

## 2021-03-11 ENCOUNTER — Other Ambulatory Visit: Payer: Self-pay | Admitting: Family Medicine

## 2021-03-12 NOTE — Telephone Encounter (Signed)
No answer at the patient's home number-cell number busy.

## 2021-03-12 NOTE — Telephone Encounter (Signed)
I sent message to nursing today to check in with her regarding last lab results note. Labs resulted by Dr. Volanda Napoleon, but I don't see that patient was made aware. Sugars are more elevated. Patient transferred care to Dr. Yong Channel, but isn't set to see him until May. Getting med requests for metformin which she stopped taking last year. She needs to be seen by provider for follow up on sugars (this was written in note to nursing) and determine med treatment. Adding note in here for why metformin refill requested by pharmacy is being refused.

## 2021-03-12 NOTE — Telephone Encounter (Signed)
Patient returned call and was informed of message,  patient has scheduled appt 04/16/21 for Dm follow up patient is also requesting metformin refill send to [facility]

## 2021-03-13 ENCOUNTER — Other Ambulatory Visit: Payer: Self-pay | Admitting: Family Medicine

## 2021-03-13 MED ORDER — METFORMIN HCL 500 MG PO TABS: ORAL_TABLET | ORAL | 0 refills | Status: DC

## 2021-03-13 NOTE — Telephone Encounter (Signed)
Refill sent.

## 2021-03-23 ENCOUNTER — Other Ambulatory Visit: Payer: Self-pay | Admitting: Family Medicine

## 2021-03-23 DIAGNOSIS — E559 Vitamin D deficiency, unspecified: Secondary | ICD-10-CM

## 2021-03-26 ENCOUNTER — Other Ambulatory Visit: Payer: Self-pay | Admitting: Family Medicine

## 2021-03-26 DIAGNOSIS — E559 Vitamin D deficiency, unspecified: Secondary | ICD-10-CM

## 2021-04-16 ENCOUNTER — Ambulatory Visit (INDEPENDENT_AMBULATORY_CARE_PROVIDER_SITE_OTHER): Payer: Medicare PPO | Admitting: Family Medicine

## 2021-04-16 DIAGNOSIS — E1165 Type 2 diabetes mellitus with hyperglycemia: Secondary | ICD-10-CM

## 2021-04-17 NOTE — Progress Notes (Signed)
NO SHOW 04/16/21

## 2021-05-03 ENCOUNTER — Encounter: Payer: Self-pay | Admitting: Family Medicine

## 2021-05-03 ENCOUNTER — Ambulatory Visit: Payer: Medicare PPO | Admitting: Family Medicine

## 2021-05-03 VITALS — BP 140/90 | HR 97 | Temp 99.2°F | Ht 61.0 in | Wt 109.8 lb

## 2021-05-03 DIAGNOSIS — E1169 Type 2 diabetes mellitus with other specified complication: Secondary | ICD-10-CM

## 2021-05-03 DIAGNOSIS — E785 Hyperlipidemia, unspecified: Secondary | ICD-10-CM | POA: Diagnosis not present

## 2021-05-03 DIAGNOSIS — R454 Irritability and anger: Secondary | ICD-10-CM | POA: Diagnosis not present

## 2021-05-03 MED ORDER — ESCITALOPRAM OXALATE 5 MG PO TABS: 5.0000 mg | ORAL_TABLET | Freq: Every day | ORAL | 0 refills | Status: DC

## 2021-05-03 MED ORDER — METFORMIN HCL ER 500 MG PO TB24: 500.0000 mg | ORAL_TABLET | Freq: Every day | ORAL | 0 refills | Status: DC

## 2021-05-03 NOTE — Patient Instructions (Addendum)
If your blood pressures at home are not regularly less than 140/90, let me know.   Try the metformin XR (this is an extended release and usually better on the stomach). If it upsets your stomach, then please let me know and we can try something different.   Complete bloodwork prior to your visit in may with Dr. Yong Channel. This way you can review health concerns with him and see what adjustments are needed.   I would suggest checking fasting blood sugar on occasion - just to make sure you are running around 160 or less. Make sure to check if you are not feeling well. We will adjust medication based on numbers.

## 2021-05-03 NOTE — Progress Notes (Signed)
Gabrielle Robinson DOB: [birth year] Encounter date: 05/03/2021  This is a 81 y.o. female who presents with Chief Complaint  Patient presents with   Follow-up    History of present illness:  Last visit with me was 02/2020. Patient requested transfer of care in October to Dr. Yong Channel but has not yet followed up with him. Ccd labs that came to me after wellness visit showing A1C of 10.7 (02/17/21) prompted phone call for follow up for diabetes management. No show visit on 04/16/21 with me.   Hasn't taken metformin for awhile. Upset stomach when she was taking it. This was only sugar medication. Rarely checks sugars at home. Not as hungry as she used to be. Portions are smaller in general. Not trying to loose weight, although slight decrease from prior visit. Does drink wine at night. Minimal amount of wine.   Does sleep well.   Up a couple of times/night to urinate. Usually has drink with her. Tends to drink more tea through the day.   Blood pressure - not checking at home. Husband wondering if ok (he sent in note today)  Husband worried about her being tired a lot. She feels it is related to just being home more with covid.   Husband states irritated easily with every day tasks that don't do well. She stopped lexapro (although doesn't remember stopping/starting in past).   Following up with derm - Jarome Matin in march  She did get flu shot this year at [facility].  No Known Allergies Current Meds  Medication Sig   metFORMIN (GLUCOPHAGE XR) 500 MG 24 hr tablet Take 1 tablet (500 mg total) by mouth daily with breakfast.   Multiple Vitamin (MULTIVITAMIN WITH MINERALS) TABS tablet Take 1 tablet by mouth daily.   OVER THE COUNTER MEDICATION Sleep aid   Polyethyl Glycol-Propyl Glycol (SYSTANE ULTRA OP) Place 1 drop into both eyes daily as needed (dry eyes).   Vitamin D, Ergocalciferol, (DRISDOL) 1.25 MG (50000 UNIT) CAPS capsule Take 1 capsule (50,000 Units total) by mouth every 7 (seven) days.    [DISCONTINUED] Biotin 5 MG TABS Take 5 mg by mouth daily.   [DISCONTINUED] Calcium Acetate, Phos Binder, (CALCIUM ACETATE PO) Take 1 tablet by mouth daily.   [DISCONTINUED] Cyanocobalamin (VITAMIN B-12 PO) Take by mouth.   [DISCONTINUED] diphenhydrAMINE (BENADRYL) 25 MG tablet Take 12.5 mg by mouth at bedtime.   [DISCONTINUED] escitalopram (LEXAPRO) 5 MG tablet Take 1 tablet (5 mg total) by mouth daily.   [DISCONTINUED] metFORMIN (GLUCOPHAGE) 500 MG tablet TAKE 1 TABLET BY MOUTH TWICE A DAY WITH MEALS    Review of Systems  Constitutional:  Negative for chills, fatigue and fever.  Respiratory:  Negative for cough, chest tightness, shortness of breath and wheezing.   Cardiovascular:  Negative for chest pain, palpitations and leg swelling.  Neurological:  Negative for dizziness, light-headedness and headaches.  Psychiatric/Behavioral:  Agitation: gets irritable, per husbands note he sent in with her. she stopped taking lexapro at some point. The patient is not nervous/anxious.    Objective:  BP 140/90 (BP Location: Left Arm, Patient Position: Sitting, Cuff Size: Normal)    Pulse 97    Temp 99.2 F (37.3 C) (Oral)    Ht 5\' 1"  (1.549 m)    Wt 109 lb 12.8 oz (49.8 kg)    SpO2 94%    BMI 20.75 kg/m   Weight: 109 lb 12.8 oz (49.8 kg)   BP Readings from Last 3 Encounters:  05/03/21 140/90  02/25/21 118/68  02/17/21 (!) 128/100   Wt Readings from Last 3 Encounters:  05/03/21 109 lb 12.8 oz (49.8 kg)  02/25/21 112 lb 6.4 oz (51 kg)  02/17/21 114 lb 6.4 oz (51.9 kg)    Physical Exam Constitutional:      General: She is not in acute distress.    Appearance: She is well-developed.  HENT:     Right Ear: Decreased hearing noted.     Left Ear: Decreased hearing noted.  Cardiovascular:     Rate and Rhythm: Normal rate and regular rhythm.     Heart sounds: Normal heart sounds. No murmur heard.   No friction rub.  Pulmonary:     Effort: Pulmonary effort is normal. No respiratory distress.      Breath sounds: Normal breath sounds. No wheezing or rales.  Musculoskeletal:     Right lower leg: No edema.     Left lower leg: No edema.  Neurological:     Mental Status: She is alert and oriented to person, place, and time.  Psychiatric:        Behavior: Behavior normal.    Assessment/Plan  1. Type 2 diabetes mellitus with other specified complication, without long-term current use of insulin (HCC) She was on metformin for a time, but had looser stools. We are going to try metformin XR to see if she tolerates. Discussed that there are multiple other options for treatment if this is not well tolerated. Encouraged at least occasional sugar check at home. She has appointment scheduled with Dr. Yong Channel in May. We also discussed typically we add ace/arb for renal protection. We are just going to start with metformin today and get her on track with sugar control first (she has stopped most medications in her own in the past, so I want to make sure she tolerates this first before adding more). However, encouraged her to check bp at home and let me know if regularly more than 140/90. We also discussed statin addition in future. She was on pravastatin in past, but stopped on her own (doesn't recall side effects). - Hemoglobin A1c; Future - Comprehensive metabolic panel; Future - Microalbumin / creatinine urine ratio; Future  2. Hyperlipidemia, unspecified hyperlipidemia type See above.should be on statin for preventative. Discussed that she can review adding with Dr. Yong Channel.  - Lipid panel; Future  3. Irritability She would like to restart the lexapro. I have refilled this for her today.   Return for bloodwork in 3 months time; prior to next visit with Dr. Yong Channel.      Micheline Rough, MD

## 2021-05-06 ENCOUNTER — Other Ambulatory Visit: Payer: Self-pay

## 2021-05-06 ENCOUNTER — Inpatient Hospital Stay: Payer: Medicare PPO | Attending: Oncology | Admitting: Oncology

## 2021-05-06 VITALS — BP 134/68 | HR 97 | Temp 97.9°F | Resp 15 | Ht 61.0 in | Wt 108.5 lb

## 2021-05-06 DIAGNOSIS — C439 Malignant melanoma of skin, unspecified: Secondary | ICD-10-CM

## 2021-05-06 DIAGNOSIS — Z85820 Personal history of malignant melanoma of skin: Secondary | ICD-10-CM | POA: Insufficient documentation

## 2021-05-06 NOTE — Progress Notes (Signed)
Hematology and Oncology Follow Up Visit  Gabrielle Robinson [id number] 1940-12-06 81 y.o. 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  She was found to have T2N1 disease.   Prior Therapy:  She is a status post wide excision and sentinel lymph node sampling completed and February 08, 2018.  Current therapy: Active surveillance   Interim History: [name] returns today for a follow-up visit.  Since the last visit, she reports no major changes in her health.  She denies any skin rashes or lesions.  She denies any hospitalizations or illnesses.  She denies any constitutional symptoms.  Her performance status quality of life remains unchanged.   Medications: Reviewed without changes. Current Outpatient Medications  Medication Sig Dispense Refill   escitalopram (LEXAPRO) 5 MG tablet Take 1 tablet (5 mg total) by mouth daily. 90 tablet 0   metFORMIN (GLUCOPHAGE XR) 500 MG 24 hr tablet Take 1 tablet (500 mg total) by mouth daily with breakfast. 90 tablet 0   Multiple Vitamin (MULTIVITAMIN WITH MINERALS) TABS tablet Take 1 tablet by mouth daily.     OVER THE COUNTER MEDICATION Sleep aid     Polyethyl Glycol-Propyl Glycol (SYSTANE ULTRA OP) Place 1 drop into both eyes daily as needed (dry eyes).     Vitamin D, Ergocalciferol, (DRISDOL) 1.25 MG (50000 UNIT) CAPS capsule Take 1 capsule (50,000 Units total) by mouth every 7 (seven) days. 12 capsule 0   No current facility-administered medications for this visit.     Allergies: No Known Allergies      Physical Exam:   Blood pressure 134/68, pulse 97, temperature 97.9 F (36.6 C), temperature source Temporal, resp. rate 15, height 5\' 1"  (1.549 m), weight 108 lb 8 oz (49.2 kg), SpO2 98 %.    ECOG: 1    General appearance: Alert, awake without any distress. Head: Atraumatic without abnormalities Oropharynx:  Without any thrush or ulcers. Eyes: No scleral icterus. Lymph nodes: No lymphadenopathy noted in the cervical, supraclavicular, or axillary nodes Heart:regular rate and rhythm, without any murmurs or gallops.   Lung: Clear to auscultation without any rhonchi, wheezes or dullness to percussion. Abdomin: Soft, nontender without any shifting dullness or ascites. Musculoskeletal: No clubbing or cyanosis. Neurological: No motor or sensory deficits. Skin: No rashes or lesions.        Lab Results: Lab Results  Component Value Date   WBC 7.8 02/17/2021   HGB 14.7 02/17/2021   HCT 44.0 02/17/2021   MCV 91.9 02/17/2021   PLT 258 02/17/2021     Chemistry      Component Value Date/Time   NA 134 (L) 02/17/2021 1551   K 5.2 02/17/2021 1551   CL 97 (L) 02/17/2021 1551   CO2 20 02/17/2021 1551   BUN 22 02/17/2021 1551   CREATININE 1.29 (H) 02/17/2021 1551      Component Value Date/Time   CALCIUM 9.0 02/17/2021 1551   ALKPHOS 109 04/10/2020 1028   AST 15 02/17/2021 1551   AST 15 04/10/2020 1028   ALT 12 02/17/2021 1551   ALT 16 04/10/2020 1028   BILITOT 0.4 02/17/2021 1551   BILITOT 0.7 04/10/2020 1028        Impression and Plan:   81 year old woman with:  1.  Cutaneous melanoma of the lower extremity diagnosed in 2019.  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  He continues to be on active surveillance at this time without any evidence of relapsed disease.  Salvage therapy options including oral targeted therapy, immunotherapy among others were reviewed if she develops any relapse disease.  She has no signs or symptoms to suggest relapse at this time.  She is agreeable at this time.  Laboratory data obtained on February 17, 2021 reviewed showed no evidence to suggest any abnormalities.    2.  Dermatology surveillance: She is currently up-to-date at this time.  4.  Age-appropriate cancer screening: She is up-to-date at this time.   5.  Follow-up:  In 1 year for repeat follow-up.  30  minutes were dedicated to this visit.  The time was spent on reviewing laboratory data, disease status update and different salvage therapy options for the future.     Zola Button, MD 2/9/20239:38 AM

## 2021-06-09 NOTE — Progress Notes (Signed)
?Phone: [phone] ?  ?Subjective:  ?Patient presents today to establish care with me as their new primary care provider. Patient was formerly a patient of Dr. Toy Care ?Marland Kitchen  ?Chief Complaint  ?Patient presents with  ? Transitions Of Care  ?   ?  ? Diabetes  ? Depression  ? spot on left leg  ?  Pt c/o spoton left leg that has been there for a few months.  ? ?See problem oriented charting ? ?The following were reviewed and entered/updated in epic: ?Past Medical History:  ?Diagnosis Date  ? Cancer [facility])   ? Left Hip Melanoma  ? Diabetes mellitus without complication ([place])   ? Hyperlipemia 01/05/2009  ? Qualifier: Diagnosis of  By: Arnoldo Morale MD, Balinda Quails   ? Major depressive disorder with single episode, in full remission ([place]) 05/28/2015  ? ?Patient Active Problem List  ? Diagnosis Date Noted  ? History of melanoma 07/27/2021  ?  Priority: High  ? Diabetes mellitus ([place]) 04/01/2013  ?  Priority: High  ? Atherosclerosis of aorta ([place]) 07/27/2021  ?  Priority: Medium   ? Major depressive disorder with single episode, in full remission ([place]) 05/28/2015  ?  Priority: Medium   ? Hyperlipemia 01/05/2009  ?  Priority: Medium   ? Bilateral shoulder pain 05/28/2015  ?  Priority: Low  ? ?Past Surgical History:  ?Procedure Laterality Date  ? MELANOMA EXCISION Left 02/08/2018  ? Procedure: WIDE LOCAL EXCISION WITH ADVANCEMENT FLAP CLOSURE OF LEFT HIP MELANOMA ERAS PATHWAY;  Surgeon: [name], MD;  Location: [place];  Service: General;  Laterality: Left;  ? SENTINEL NODE BIOPSY Left 02/08/2018  ? Procedure: LEFT INGUINAL SENTINEL LYMPH NODE BIOPSY;  Surgeon: [name], MD;  Location: [place];  Service: General;  Laterality: Left;  ? ? ?Family History  ?Problem Relation Age of Onset  ? Diabetes Mellitus II Mother   ?     died of "old age" right at 39  ? Diabetes Mellitus II Father   ?     died of "old age" in 17s  ? Melanoma Sister   ? Breast cancer Maternal Grandmother   ? Renal cancer Maternal Grandfather   ? ? ?Medications- reviewed  and updated ?Current Outpatient Medications  ?Medication Sig Dispense Refill  ? escitalopram (LEXAPRO) 5 MG tablet Take 1 tablet (5 mg total) by mouth daily. 90 tablet 0  ? Multiple Vitamin (MULTIVITAMIN WITH MINERALS) TABS tablet Take 1 tablet by mouth daily.    ? OVER THE COUNTER MEDICATION Sleep aid    ? Polyethyl Glycol-Propyl Glycol (SYSTANE ULTRA OP) Place 1 drop into both eyes daily as needed (dry eyes).    ? pravastatin (PRAVACHOL) 40 MG tablet Take 1 tablet (40 mg total) by mouth daily. 90 tablet 3  ? Vitamin D, Ergocalciferol, (DRISDOL) 1.25 MG (50000 UNIT) CAPS capsule Take 1 capsule (50,000 Units total) by mouth every 7 (seven) days. 12 capsule 0  ? metFORMIN (GLUCOPHAGE XR) 500 MG 24 hr tablet Take 2 tablets (1,000 mg total) by mouth 2 (two) times daily with a meal. 360 tablet 3  ? ?No current facility-administered medications for this visit.  ? ? ?Allergies-reviewed and updated ?No Known Allergies ? ?Social History  ? ?Social History Narrative  ? Home Situation: lives with husband. 1 son and two grandsons 61 (studying linguistics) and 41 in 2023  ?   ? 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   ? Hobbies: reading, enjoys  time of at home  ?   ? ?Objective  ?Objective:  ?BP 120/62   Pulse 86   Temp 98 ?F (36.7 ?C)   Ht '5\' 1"'$  (1.549 m)   Wt 110 lb 6.4 oz (50.1 kg)   SpO2 96%   BMI 20.86 kg/m?  ?Gen: NAD, resting comfortably ?HEENT: Mucous membranes are moist. Oropharynx normal ?Neck: no thyromegaly ?CV: RRR no murmurs rubs or gallops ?Lungs: CTAB no crackles, wheeze, rhonchi ?Abdomen: soft/nontender/nondistended/normal bowel sounds. No rebound or guarding.  ?Ext: no edema ?Skin: warm, dry ?Neuro: grossly normal, moves all extremities, PERRLA, hard of hearing ? ?Diabetic Foot Exam - Simple   ?Simple Foot Form ?Diabetic Foot exam was performed with the following findings: Yes 07/27/2021  3:49 PM  ?Visual Inspection ?No deformities, no ulcerations, no other skin breakdown  bilaterally: Yes ?Sensation Testing ?Intact to touch and monofilament testing bilaterally: Yes ?Pulse Check ?Posterior Tibialis and Dorsalis pulse intact bilaterally: Yes ?Comments ?  ?   ?  ?Assessment and Plan:  ? ?# Diabetes- peak a1c of 10.7 in November 2022 off meds ?S: Medication:Metformin 500 mg extended release- titrate to 1 g BID ? A/P: Poor control- titrate up metformin ?-prefers to avoid jardiance due to UTI risk ?-prefers pill over injection- open to rybelsus as next step ? ?#hyperlipidemia ?#aortic atherosclerosis - LDL goal 70 ?S: Medication:none at present  ?Lab Results  ?Component Value Date  ? CHOL 238 (H) 07/19/2021  ? HDL 61.50 07/19/2021  ? LDLCALC 143 (H) 07/19/2021  ? LDLDIRECT 162.3 01/01/2010  ? TRIG 169.0 (H) 07/19/2021  ? CHOLHDL 4 07/19/2021  ?A/P: poor control- restart pravastatin 40 mg with elevated lipids- recheck in 2-6 months ?  ?# Depression ?S: Medication:Lexapro  5 mg  ? ?  07/27/2021  ?  2:58 PM 05/03/2021  ? 12:47 PM 02/25/2021  ?  2:22 PM  ?Depression screen PHQ 2/9  ?Decreased Interest 0 0 0  ?Down, Depressed, Hopeless 0 0 0  ?PHQ - 2 Score 0 0 0  ?Altered sleeping 0  0  ?Tired, decreased energy 0    ?Change in appetite 0 0 0  ?Feeling bad or failure about yourself  0 0 0  ?Trouble concentrating 0 0 0  ?Moving slowly or fidgety/restless 0 0   ?Suicidal thoughts 0 0   ?PHQ-9 Score 0  0  ?Difficult doing work/chores Not difficult at all    ?A/P: doing well on current meds/full remission- continue current medicine ? ?#Vitamin D deficiency ?S: Medication: Previously treated with high-dose vitamin D 50,000 units for 12 weeks in November- ?- takes vitamin D daily but doesn't know dose ?A/P: hopefully improved- update with next labs- she will bring dose of vitamin D to next visit  ? ?#Skin lesion on left leg-appears to be possible squamous cell- has derm visit this month  ?-history of melnaoma and follows closely ? ?Recommended follow up: Return in about 1 month (around 08/27/2021) for  followup or sooner if needed.Schedule b4 you leave. ?Future Appointments  ?Date Time Provider [place]  ?02/28/2022  1:45 PM [facility]SE HEALTH ADVISOR [facility]  ?05/06/2022 10:00 AM Shadad, Mathis Dad, MD [facility] None  ? ?Lab/Order associations: ?  ICD-10-CM   ?1. Type 2 diabetes mellitus with hyperglycemia, without long-term current use of insulin (HCC)  E11.65   ?  ?2. Depression, recurrent ([place])  F33.9   ?  ?3. Atherosclerosis of aorta (HCC)  I70.0   ?  ?4. History of melanoma  Z85.820   ?  ?  5. Major depressive disorder with single episode, in full remission ([place])  F32.5   ?  ? ? ?Meds ordered this encounter  ?Medications  ? metFORMIN (GLUCOPHAGE XR) 500 MG 24 hr tablet  ?  Sig: Take 2 tablets (1,000 mg total) by mouth 2 (two) times daily with a meal.  ?  Dispense:  360 tablet  ?  Refill:  3  ? pravastatin (PRAVACHOL) 40 MG tablet  ?  Sig: Take 1 tablet (40 mg total) by mouth daily.  ?  Dispense:  90 tablet  ?  Refill:  3  ? ?Time Spent: ?44 minutes of total time (3:10 PM- 3:54 PM) was spent on the date of the encounter performing the following actions: chart review prior to seeing the patient, obtaining history and updating chart, performing a medically necessary exam, counseling on the treatment plan, placing orders for meds, and documenting in our EHR.  ? ? ?Return precautions advised.  ?Garret Reddish, MD ? ? ?

## 2021-07-19 ENCOUNTER — Other Ambulatory Visit (INDEPENDENT_AMBULATORY_CARE_PROVIDER_SITE_OTHER): Payer: Medicare PPO

## 2021-07-19 DIAGNOSIS — E1169 Type 2 diabetes mellitus with other specified complication: Secondary | ICD-10-CM

## 2021-07-19 DIAGNOSIS — E785 Hyperlipidemia, unspecified: Secondary | ICD-10-CM | POA: Diagnosis not present

## 2021-07-19 LAB — COMPREHENSIVE METABOLIC PANEL
ALT: 12 U/L (ref 0–35)
AST: 17 U/L (ref 0–37)
Albumin: 4 g/dL (ref 3.5–5.2)
Alkaline Phosphatase: 75 U/L (ref 39–117)
BUN: 14 mg/dL (ref 6–23)
CO2: 27 mEq/L (ref 19–32)
Calcium: 9.4 mg/dL (ref 8.4–10.5)
Chloride: 100 mEq/L (ref 96–112)
Creatinine, Ser: 0.82 mg/dL (ref 0.40–1.20)
GFR: 66.93 mL/min (ref >60.00)
Glucose, Bld: 209 mg/dL — ABNORMAL HIGH (ref 70–99)
Potassium: 4.6 mEq/L (ref 3.5–5.1)
Sodium: 135 mEq/L (ref 135–145)
Total Bilirubin: 0.4 mg/dL (ref 0.2–1.2)
Total Protein: 7.2 g/dL (ref 6.0–8.3)

## 2021-07-19 LAB — LIPID PANEL
Cholesterol: 238 mg/dL — ABNORMAL HIGH (ref 0–200)
HDL: 61.5 mg/dL (ref >39.00)
LDL Cholesterol: 143 mg/dL — ABNORMAL HIGH (ref 0–99)
NonHDL: 176.3
Total CHOL/HDL Ratio: 4
Triglycerides: 169 mg/dL — ABNORMAL HIGH (ref 0.0–149.0)
VLDL: 33.8 mg/dL (ref 0.0–40.0)

## 2021-07-19 LAB — HEMOGLOBIN A1C: Hgb A1c MFr Bld: 10 % — ABNORMAL HIGH (ref 4.6–6.5)

## 2021-07-19 LAB — MICROALBUMIN / CREATININE URINE RATIO
Creatinine,U: 116.9 mg/dL
Microalb Creat Ratio: 3.4 mg/g (ref 0.0–30.0)
Microalb, Ur: 4 mg/dL — ABNORMAL HIGH (ref 0.0–1.9)

## 2021-07-27 ENCOUNTER — Encounter: Payer: Self-pay | Admitting: Family Medicine

## 2021-07-27 ENCOUNTER — Ambulatory Visit: Payer: Medicare PPO | Admitting: Family Medicine

## 2021-07-27 VITALS — BP 120/62 | HR 86 | Temp 98.0°F | Ht 61.0 in | Wt 110.4 lb

## 2021-07-27 DIAGNOSIS — F325 Major depressive disorder, single episode, in full remission: Secondary | ICD-10-CM | POA: Diagnosis not present

## 2021-07-27 DIAGNOSIS — F339 Major depressive disorder, recurrent, unspecified: Secondary | ICD-10-CM | POA: Diagnosis not present

## 2021-07-27 DIAGNOSIS — I700 Atherosclerosis of aorta: Secondary | ICD-10-CM | POA: Diagnosis not present

## 2021-07-27 DIAGNOSIS — Z85820 Personal history of malignant melanoma of skin: Secondary | ICD-10-CM | POA: Diagnosis not present

## 2021-07-27 DIAGNOSIS — E1165 Type 2 diabetes mellitus with hyperglycemia: Secondary | ICD-10-CM | POA: Diagnosis not present

## 2021-07-27 MED ORDER — PRAVASTATIN SODIUM 40 MG PO TABS: 40.0000 mg | ORAL_TABLET | Freq: Every day | ORAL | 3 refills | Status: DC

## 2021-07-27 MED ORDER — METFORMIN HCL ER 500 MG PO TB24: 1000.0000 mg | ORAL_TABLET | Freq: Two times a day (BID) | ORAL | 3 refills | Status: DC

## 2021-07-27 NOTE — Patient Instructions (Addendum)
Starting Sunday may 7th ?Increase metformin to '500mg'$  twice daily extended release ? ?Starting Sunday may 14th ?Increase metformin to '1000mg'$  in the morning and '500mg'$  in the evening ? ?Starting may 21st ?Increase metformin to '1000mg'$  in the morning and '1000mg'$  in the evening ? ?If at any point in increasing you develop diarrhea/significant loose stools - take one step back down and let me know.  ? ?she will bring dose of vitamin D to next visit  ? ?Recommended follow up: Return in about 1 month (around 08/27/2021) for followup or sooner if needed.Schedule b4 you leave. ? ? ?

## 2021-08-05 DIAGNOSIS — Z85828 Personal history of other malignant neoplasm of skin: Secondary | ICD-10-CM | POA: Diagnosis not present

## 2021-08-05 DIAGNOSIS — Z85820 Personal history of malignant melanoma of skin: Secondary | ICD-10-CM | POA: Diagnosis not present

## 2021-08-05 DIAGNOSIS — C44729 Squamous cell carcinoma of skin of left lower limb, including hip: Secondary | ICD-10-CM | POA: Diagnosis not present

## 2021-08-05 DIAGNOSIS — D485 Neoplasm of uncertain behavior of skin: Secondary | ICD-10-CM | POA: Diagnosis not present

## 2021-08-27 ENCOUNTER — Ambulatory Visit: Payer: Medicare PPO | Admitting: Family Medicine

## 2021-08-27 ENCOUNTER — Encounter: Payer: Self-pay | Admitting: Family Medicine

## 2021-08-27 VITALS — BP 132/80 | HR 72 | Temp 97.9°F | Ht 61.0 in | Wt 112.0 lb

## 2021-08-27 DIAGNOSIS — E785 Hyperlipidemia, unspecified: Secondary | ICD-10-CM | POA: Diagnosis not present

## 2021-08-27 DIAGNOSIS — E1165 Type 2 diabetes mellitus with hyperglycemia: Secondary | ICD-10-CM

## 2021-08-27 DIAGNOSIS — F325 Major depressive disorder, single episode, in full remission: Secondary | ICD-10-CM

## 2021-08-27 MED ORDER — RYBELSUS 3 MG PO TABS: 3.0000 mg | ORAL_TABLET | Freq: Every day | ORAL | 0 refills | Status: DC

## 2021-08-27 MED ORDER — RYBELSUS 7 MG PO TABS: 7.0000 mg | ORAL_TABLET | Freq: Every day | ORAL | 5 refills | Status: DC

## 2021-08-27 MED ORDER — METFORMIN HCL ER 500 MG PO TB24: 1000.0000 mg | ORAL_TABLET | Freq: Every day | ORAL | 3 refills | Status: DC

## 2021-08-27 NOTE — Progress Notes (Signed)
Phone [phone] In person visit   Subjective:   Gabrielle Robinson is a 81 y.o. year old very pleasant female patient who presents for/with See problem oriented charting Chief Complaint  Patient presents with   Follow-up   Diabetes    Metformin has caused diarrhea and she is only taking it bid.     Past Medical History-  Patient Active Problem List   Diagnosis Date Noted   History of melanoma 07/27/2021    Priority: High   Diabetes mellitus ([place]) 04/01/2013    Priority: High   Atherosclerosis of aorta ([place]) 07/27/2021    Priority: Medium    Major depressive disorder with single episode, in full remission ([place]) 05/28/2015    Priority: Medium    Hyperlipemia 01/05/2009    Priority: Medium    Bilateral shoulder pain 05/28/2015    Priority: Low    Medications- reviewed and updated Current Outpatient Medications  Medication Sig Dispense Refill   escitalopram (LEXAPRO) 5 MG tablet Take 1 tablet (5 mg total) by mouth daily. 90 tablet 0   Multiple Vitamin (MULTIVITAMIN WITH MINERALS) TABS tablet Take 1 tablet by mouth daily.     OVER THE COUNTER MEDICATION Sleep aid     Polyethyl Glycol-Propyl Glycol (SYSTANE ULTRA OP) Place 1 drop into both eyes daily as needed (dry eyes).     pravastatin (PRAVACHOL) 40 MG tablet Take 1 tablet (40 mg total) by mouth daily. 90 tablet 3   Semaglutide (RYBELSUS) 3 MG TABS Take 3 mg by mouth daily. 1st 30 days only- start with 3 mg dose. 30 tablet 0   Semaglutide (RYBELSUS) 7 MG TABS Take 7 mg by mouth daily. Start after 30 days of 3 mg dose then continue. 30 tablet 5   metFORMIN (GLUCOPHAGE XR) 500 MG 24 hr tablet Take 2 tablets (1,000 mg total) by mouth daily with breakfast. 180 tablet 3   No current facility-administered medications for this visit.     Objective:  BP 132/80   Pulse 72   Temp 97.9 F (36.6 C)   Ht '5\' 1"'$  (1.549 m)   Wt 112 lb (50.8 kg)   SpO2 99%   BMI 21.16 kg/m  Gen: NAD, resting comfortably CV: RRR no murmurs rubs or  gallops Lungs: CTAB no crackles, wheeze, rhonchi Ext: no edema Skin: warm, dry Hard of hearing    Assessment and Plan   #short term memory loss -she has noted some issues for about a year. We will check MMSE next visit- she will ewear hearing aids at that time -also worth checking b12 with labs. Has some b12- may try once a week while on metformin  # Diabetes- peak a1c of 10.7 in November 2022 off meds S: Medication:Metformin 500 mg extended release last visit- we had planned to try 1g BID (3 a day caused more issues) but she has had diarrhea and only taking  '1000mg'$  XR once a day with breakfast (diarrhea improved but has had one episode)  Lab Results  Component Value Date   HGBA1C 10.0 (H) 07/19/2021  A/P: suspect poor control but improving- continue metformin '1000mg'$  xr in the am and add rybelsus - recheck a1c in late July though will not see full benefit of rybelsus yet  #hyperlipidemia #aortic atherosclerosis - LDL goal 70 S: Medication: last visit restarted pravastatin 40 mg Lab Results  Component Value Date   CHOL 238 (H) 07/19/2021   HDL 61.50 07/19/2021   LDLCALC 143 (H) 07/19/2021   LDLDIRECT 162.3 01/01/2010  TRIG 169.0 (H) 07/19/2021   CHOLHDL 4 07/19/2021  A/P: suspect improving- update LDL next visit. Hoping get under 70   # Depression S: Medication:Lexapro  5 mg . Sleeps until noon next day, stays up late at night. . No SI.  -husband reports was more related to when son going through divorce A/P: she would like to trial off escitalopram- is going to take half tablet for a month then stop medicine- if worsening symptoms can restart and let me know  #Vitamin D deficiency S: Medication: Previously treated with high-dose vitamin D 50,000 units for 12 weeks in November. She is currently on 2000 units per day Last vitamin D Lab Results  Component Value Date   VD25OH 20 (L) 02/17/2021  A/P: update vitamin D next visit- hopefully improved   #Skin cancer- on left  lower leg- she thinks it was all cut out and has follow up with dermatology soon- next week  Recommended follow up: No follow-ups on file. Future Appointments  Date Time Provider [place]  02/28/2022  1:45 PM [facility] HEALTH ADVISOR [facility]  05/06/2022 10:00 AM Wyatt Portela, MD [facility] None   Lab/Order associations:   ICD-10-CM   1. Type 2 diabetes mellitus with hyperglycemia, without long-term current use of insulin (HCC)  E11.65     2. Hyperlipidemia, unspecified hyperlipidemia type  E78.5     3. Major depressive disorder with single episode, in full remission ([place])  F32.5       Meds ordered this encounter  Medications   Semaglutide (RYBELSUS) 3 MG TABS    Sig: Take 3 mg by mouth daily. 1st 30 days only- start with 3 mg dose.    Dispense:  30 tablet    Refill:  0   Semaglutide (RYBELSUS) 7 MG TABS    Sig: Take 7 mg by mouth daily. Start after 30 days of 3 mg dose then continue.    Dispense:  30 tablet    Refill:  5   metFORMIN (GLUCOPHAGE XR) 500 MG 24 hr tablet    Sig: Take 2 tablets (1,000 mg total) by mouth daily with breakfast.    Dispense:  180 tablet    Refill:  3    Return precautions advised.  Garret Reddish, MD

## 2021-08-27 NOTE — Patient Instructions (Addendum)
Please schedule diabetic eye exam.  Continue metformin '1000mg'$  XR in the morning  Trial rybelsus 3 mg for 1 month then go up to 7 mg   she would like to trial off escitalopram- is going to take half tablet for a month then stop medicine- if worsening symptoms can restart and let me know  Bring hearing aids to next visit and we will update memory test  Recommended follow up: 10/20/21 or later for next visit and do bloodwork at that time

## 2021-09-01 DIAGNOSIS — L853 Xerosis cutis: Secondary | ICD-10-CM | POA: Diagnosis not present

## 2021-09-01 DIAGNOSIS — L570 Actinic keratosis: Secondary | ICD-10-CM | POA: Diagnosis not present

## 2021-09-01 DIAGNOSIS — Z85828 Personal history of other malignant neoplasm of skin: Secondary | ICD-10-CM | POA: Diagnosis not present

## 2021-09-01 DIAGNOSIS — Z85820 Personal history of malignant melanoma of skin: Secondary | ICD-10-CM | POA: Diagnosis not present

## 2021-09-01 DIAGNOSIS — L821 Other seborrheic keratosis: Secondary | ICD-10-CM | POA: Diagnosis not present

## 2021-09-01 DIAGNOSIS — L812 Freckles: Secondary | ICD-10-CM | POA: Diagnosis not present

## 2021-09-02 DIAGNOSIS — C44729 Squamous cell carcinoma of skin of left lower limb, including hip: Secondary | ICD-10-CM | POA: Diagnosis not present

## 2021-09-02 DIAGNOSIS — Z85820 Personal history of malignant melanoma of skin: Secondary | ICD-10-CM | POA: Diagnosis not present

## 2021-09-02 DIAGNOSIS — Z85828 Personal history of other malignant neoplasm of skin: Secondary | ICD-10-CM | POA: Diagnosis not present

## 2021-10-13 ENCOUNTER — Telehealth: Payer: Self-pay | Admitting: Family Medicine

## 2021-10-13 NOTE — Telephone Encounter (Signed)
Patient's husband Gabrielle Robinson states that [facility] does not have the RX with the increased dosage for (1st 30 days was '3mg'$ ):  Semaglutide (RYBELSUS) 7 MG TABS  Bill requests RX for the above be sent to   [facility] # [place], [place] Phone:  [phone]  Fax:  [phone]

## 2021-10-14 MED ORDER — RYBELSUS 7 MG PO TABS: 7.0000 mg | ORAL_TABLET | Freq: Every day | ORAL | 5 refills | Status: DC

## 2021-10-14 NOTE — Telephone Encounter (Signed)
Rx resent to [facility].

## 2021-10-19 ENCOUNTER — Ambulatory Visit: Payer: Medicare PPO | Admitting: Family Medicine

## 2021-10-22 ENCOUNTER — Ambulatory Visit: Payer: Medicare PPO | Admitting: Family Medicine

## 2021-10-22 ENCOUNTER — Encounter: Payer: Self-pay | Admitting: Family Medicine

## 2021-10-22 VITALS — BP 130/72 | HR 69 | Temp 98.4°F | Ht 61.0 in | Wt 111.6 lb

## 2021-10-22 DIAGNOSIS — F325 Major depressive disorder, single episode, in full remission: Secondary | ICD-10-CM | POA: Diagnosis not present

## 2021-10-22 DIAGNOSIS — E785 Hyperlipidemia, unspecified: Secondary | ICD-10-CM | POA: Diagnosis not present

## 2021-10-22 DIAGNOSIS — E1165 Type 2 diabetes mellitus with hyperglycemia: Secondary | ICD-10-CM

## 2021-10-22 DIAGNOSIS — E559 Vitamin D deficiency, unspecified: Secondary | ICD-10-CM | POA: Diagnosis not present

## 2021-10-22 LAB — POCT GLYCOSYLATED HEMOGLOBIN (HGB A1C): Hemoglobin A1C: 9.3 % — AB (ref 4.0–5.6)

## 2021-10-22 NOTE — Progress Notes (Addendum)
Phone [phone] In person visit   Subjective:   Gabrielle Robinson is a 81 y.o. year old very pleasant female patient who presents for/with See problem oriented charting Chief Complaint  Patient presents with   Follow-up    Pt states she thinks she may be lactose or have ulcerative colitis.   Diabetes   Past Medical History-  Patient Active Problem List   Diagnosis Date Noted   History of melanoma 07/27/2021    Priority: High   Diabetes mellitus ([place]) 04/01/2013    Priority: High   Vitamin D deficiency 10/22/2021    Priority: Medium    Atherosclerosis of aorta ([place]) 07/27/2021    Priority: Medium    Major depressive disorder with single episode, in full remission ([place]) 05/28/2015    Priority: Medium    Hyperlipemia 01/05/2009    Priority: Medium    Bilateral shoulder pain 05/28/2015    Priority: Low    Medications- reviewed and updated Current Outpatient Medications  Medication Sig Dispense Refill   metFORMIN (GLUCOPHAGE XR) 500 MG 24 hr tablet Take 2 tablets (1,000 mg total) by mouth daily with breakfast. 180 tablet 3   Multiple Vitamin (MULTIVITAMIN WITH MINERALS) TABS tablet Take 1 tablet by mouth daily.     OVER THE COUNTER MEDICATION Sleep aid     Polyethyl Glycol-Propyl Glycol (SYSTANE ULTRA OP) Place 1 drop into both eyes daily as needed (dry eyes).     pravastatin (PRAVACHOL) 40 MG tablet Take 1 tablet (40 mg total) by mouth daily. 90 tablet 3   Semaglutide (RYBELSUS) 7 MG TABS Take 7 mg by mouth daily. Start after 30 days of 3 mg dose then continue. 30 tablet 5   No current facility-administered medications for this visit.     Objective:  BP 130/72   Pulse 69   Temp 98.4 F (36.9 C)   Ht '5\' 1"'$  (1.549 m)   Wt 111 lb 9.6 oz (50.6 kg)   SpO2 97%   BMI 21.09 kg/m  Gen: NAD, resting comfortably    Assessment and Plan   #memory loss -noted issues short term for about a year. MMSE 29/30 with hearing aids in.  -still plan on b12 with labs -plans to get  another opinion outside of [facility] - still consider b12 next labs- b12 daily right now- suspect in normal range   # Diabetes- peak a1c of 10.7 in November 2022 off meds S: Medication:Metformin 1000 mg extended release once a day and last visit we added Rybelsus with plans to titrate to 7 mg (only for 3 days) -patient is concerned about lactose intolerance or ulcerative colitis per her report due to loose stools. No blood in stool - has had long term diarrhea but not worse on metformin Exercise and diet- had been doing a fair amount of cookout milkshakes 3x a week- cutting back and bowels are doing better Lab Results  Component Value Date   HGBA1C 9.3 (A) 10/22/2021   HGBA1C 10.0 (H) 07/19/2021   HGBA1C 10.7 (H) 02/17/2021   A/P: a1c poc down to 9.3 but has only been on rybelsus 7 mg for a few days- suspect further improvement as remains on.   - from avs "If you tolerate rybelsus 7 mg over next 3 weeks let me know and we will increase to 14 mg" -wants to hold off on diabetes education formally- gave some videos to watch -I think has a lot of space to improve on diets based on prior 3 milkshakes a  week- glad she has cut back. Doubt ulcerative colitis. Could have some lactose intolerance- issues predated metformin with loose stools so did not opt to stop  #hyperlipidemia #aortic atherosclerosis - LDL goal 70 S: Medication:Pravastatin 40 mg Lab Results  Component Value Date   CHOL 238 (H) 07/19/2021   HDL 61.50 07/19/2021   LDLCALC 143 (H) 07/19/2021   LDLDIRECT 162.3 01/01/2010   TRIG 169.0 (H) 07/19/2021   CHOLHDL 4 07/19/2021   A/P: Plan was to check direct LDL today but once again her lab has not been available all day-we will plan to recheck at next visit-continue current medication for now with LDL goal 70 or less   # Depression S: Medication:Lexapro  5 mg in the past A/P: phq9 slightly worse from 0 up to 5 (5 or less still consider full remission) but rated as not difficult at all  and we would like to minimize meds- will remain off meds- she can reach out if worsening symptoms- such as anxiety or depressed mood  #Vitamin D deficiency S: Medication: Previously treated with high-dose vitamin D 50,000 units for 12 weeks in November. Remains on 2000 units per day Last vitamin D Lab Results  Component Value Date   VD25OH 20 (L) 02/17/2021  A/P: Plan was to repeat vitamin D with labs today but our lab unfortunately has not been available all day-we will plan to recheck at next visit  Recommended follow up: Return in about 14 weeks (around 01/28/2022) for followup or sooner if needed.Schedule b4 you leave. Future Appointments  Date Time Provider [place]  01/28/2022  3:20 PM Marin Olp, MD [facility]  02/28/2022  1:45 PM [facility]-NURSE HEALTH ADVISOR [facility]  05/06/2022 10:00 AM Wyatt Portela, MD [facility] None    Lab/Order associations:   ICD-10-CM   1. Type 2 diabetes mellitus with hyperglycemia, without long-term current use of insulin (HCC)  E11.65 POCT HgB A1C    2. Major depressive disorder with single episode, in full remission ([place])  F32.5     3. Hyperlipidemia, unspecified hyperlipidemia type  E78.5     4. Vitamin D deficiency  E55.9      Time Spent: 42 minutes of total time (4:02 PM- 4:44 PM) was spent on the date of the encounter performing the following actions: chart review prior to seeing the patient, obtaining history, performing a medically necessary exam including MMSE, counseling on the treatment plan and upcoming visit needs, placing orders, and documenting in our EHR.   Return precautions advised.  Garret Reddish, MD

## 2021-10-22 NOTE — Patient Instructions (Addendum)
If you tolerate rybelsus 7 mg over next 3 weeks let me know and we will increase to 14 mg  Diabetes education-Get on youtube and access videos by Dr. Smiley Houseman (an endocrinologist. Listen to diabetes education parts 1-5) -we can send you to formal diabetes education if you would lik ein the future but you wanted to hold off for now so at least consider trying these videos  Recommended follow up: Return in about 14 weeks (around 01/28/2022) for followup or sooner if needed.Schedule b4 you leave.

## 2021-11-26 ENCOUNTER — Telehealth: Payer: Self-pay | Admitting: Family Medicine

## 2021-11-26 NOTE — Telephone Encounter (Signed)
Patient would like a cl back - was told dr hunter told her to call back should she have any issues with diabetes- only wants to talk to [facility] for hunter

## 2021-12-01 ENCOUNTER — Telehealth: Payer: Self-pay | Admitting: Family Medicine

## 2021-12-01 NOTE — Telephone Encounter (Signed)
See below

## 2021-12-01 NOTE — Telephone Encounter (Signed)
Based on time course-it sounds like Rybelsus could be causing the loose bowels intermittently-is this tolerable for her?  If so we can continue until November visit and have her refill-if intolerable we probably need to have a visit to discuss other options since her A1c was so high-also hope she is working on diet improvements

## 2021-12-01 NOTE — Telephone Encounter (Signed)
Called and spoke with pt and pt transferred to scheduling for a sooner OV.

## 2021-12-01 NOTE — Telephone Encounter (Signed)
Caller states: -pt experiencing intermittent loose bowels. -Taking all 5 medication (3 Rx, 2 OTC vitamins B and D) -pt ran out of Rybelsus on Wednesday 08/30 -no loose bowels since 08/30 - "pt is feeling good today" 09/06 -Refill of Rybelsus is ready for pick up   Caller declined OV, understands call back could take at least 48 hours.   Caller Asks: -Should patient continue all medications?   Caller Requests: -Call back with advice

## 2021-12-06 DIAGNOSIS — H903 Sensorineural hearing loss, bilateral: Secondary | ICD-10-CM | POA: Diagnosis not present

## 2021-12-10 ENCOUNTER — Ambulatory Visit: Payer: Medicare PPO | Admitting: Family Medicine

## 2021-12-10 ENCOUNTER — Encounter: Payer: Self-pay | Admitting: Family Medicine

## 2021-12-10 VITALS — BP 138/70 | HR 79 | Temp 97.6°F | Ht 61.0 in | Wt 110.0 lb

## 2021-12-10 DIAGNOSIS — I700 Atherosclerosis of aorta: Secondary | ICD-10-CM

## 2021-12-10 DIAGNOSIS — Z23 Encounter for immunization: Secondary | ICD-10-CM

## 2021-12-10 DIAGNOSIS — E559 Vitamin D deficiency, unspecified: Secondary | ICD-10-CM

## 2021-12-10 DIAGNOSIS — E1165 Type 2 diabetes mellitus with hyperglycemia: Secondary | ICD-10-CM

## 2021-12-10 DIAGNOSIS — E785 Hyperlipidemia, unspecified: Secondary | ICD-10-CM

## 2021-12-10 NOTE — Patient Instructions (Addendum)
Schedule diabetic eye exam and have it sent to [place] at [phone].  Let us know when you get new covid shot- we are thinking mid October will be available at most pharmacies - can also ask [facility] about shingrix  Call Dr. Ronnald Ramp for yearly skin check- ask him about spot on left lower leg after site of prior surgery  No changes in medicine today  Stay away from the chocolate!   Recommended follow up: Return for next already scheduled visit or sooner if needed.

## 2021-12-10 NOTE — Progress Notes (Signed)
Phone [phone] In person visit   Subjective:   Gabrielle Robinson is a 81 y.o. year old very pleasant female patient who presents for/with See problem oriented charting Chief Complaint  Patient presents with   Diabetes    Pt is here to f/u on side affects of Ryblesus, the last episode of diarrhea happened about 3 weeks ago.   Past Medical History-  Patient Active Problem List   Diagnosis Date Noted   History of melanoma 07/27/2021    Priority: High   Diabetes mellitus ([place]) 04/01/2013    Priority: High   Vitamin D deficiency 10/22/2021    Priority: Medium    Atherosclerosis of aorta ([place]) 07/27/2021    Priority: Medium    Major depressive disorder with single episode, in full remission ([place]) 05/28/2015    Priority: Medium    Hyperlipemia 01/05/2009    Priority: Medium    Bilateral shoulder pain 05/28/2015    Priority: Low   Medications- reviewed and updated Current Outpatient Medications  Medication Sig Dispense Refill   metFORMIN (GLUCOPHAGE XR) 500 MG 24 hr tablet Take 2 tablets (1,000 mg total) by mouth daily with breakfast. 180 tablet 3   Multiple Vitamin (MULTIVITAMIN WITH MINERALS) TABS tablet Take 1 tablet by mouth daily.     OVER THE COUNTER MEDICATION Sleep aid     Polyethyl Glycol-Propyl Glycol (SYSTANE ULTRA OP) Place 1 drop into both eyes daily as needed (dry eyes).     pravastatin (PRAVACHOL) 40 MG tablet Take 1 tablet (40 mg total) by mouth daily. 90 tablet 3   Semaglutide (RYBELSUS) 7 MG TABS Take 7 mg by mouth daily. Start after 30 days of 3 mg dose then continue. 30 tablet 5   No current facility-administered medications for this visit.     Objective:  BP 138/70   Pulse 79   Temp 97.6 F (36.4 C)   Ht '5\' 1"'$  (1.549 m)   Wt 110 lb (49.9 kg)   SpO2 98%   BMI 20.78 kg/m  Gen: NAD, resting comfortably CV: RRR no murmurs rubs or gallops Lungs: CTAB no crackles, wheeze, rhonchi Ext: trace edema Skin: warm, dry    Assessment and Plan   # Diabetes-  peak a1c of 10.7 in November 2022 off meds S: Medication:Metformin 500 mg extended release-listed as 1000 mg twice daily but takes once and only 500(takes b12 1000 mcg), Rybelsus 7 mg started around July 25 -she has noted on rybelsus '7mg'$ - 3 weeks ago had large volume diarhea after chocolate- if avoids chocolate no issues since that time CBGs- not checking Exercise and diet- not exercising - she is consideirng walking and I encouraged her to do  Lab Results  Component Value Date   HGBA1C 9.3 (A) 10/22/2021   HGBA1C 10.0 (H) 07/19/2021   HGBA1C 10.7 (H) 02/17/2021   A/P: hopefully a1c is improving- we discussed going up on metformin but they would like to wait until next visit with updated labs before making decision- cotinue metformin '500mg'$  ER plus rybelsus 7 mg for now and keep November visit.    #hyperlipidemia #aortic atherosclerosis - LDL goal 70 S: Medication:Pravastatin 40 mg Lab Results  Component Value Date   CHOL 238 (H) 07/19/2021   HDL 61.50 07/19/2021   LDLCALC 143 (H) 07/19/2021   LDLDIRECT 162.3 01/01/2010   TRIG 169.0 (H) 07/19/2021   CHOLHDL 4 07/19/2021   A/P: hopefully improved- prefer LDL under 70 with aortic atherosclerosis- update lipids at November visit- for now continue current  meds   # Depression S: Medication:Lexapro  5 mg in the past A/P: no recurrence of depression off of meds- continue to monitor  #Vitamin D deficiency S: Medication: Previously treated with high-dose vitamin D 50,000 units for 12 weeks in November- she is currently taking 2000 units each day Last vitamin D Lab Results  Component Value Date   VD25OH 20 (L) 02/17/2021  A/P: hopefully stable- update vitamin D next labs in november. Continue current meds for now   Recommended follow up: Return for next already scheduled visit or sooner if needed. Future Appointments  Date Time Provider [place]  01/28/2022  3:20 PM Marin Olp, MD [facility]  02/28/2022  1:45 PM  [facility] HEALTH ADVISOR [facility]  05/06/2022 10:00 AM Wyatt Portela, MD [facility] None   Lab/Order associations:   ICD-10-CM   1. Type 2 diabetes mellitus with hyperglycemia, without long-term current use of insulin (HCC)  E11.65     2. Hyperlipidemia, unspecified hyperlipidemia type  E78.5     3. Atherosclerosis of aorta (HCC)  I70.0     4. Vitamin D deficiency  E55.9     5. Need for immunization against influenza  Z23 Flu Vaccine QUAD High Dose(Fluad)     No orders of the defined types were placed in this encounter.  Return precautions advised.  Garret Reddish, MD

## 2022-01-28 ENCOUNTER — Encounter: Payer: Self-pay | Admitting: Family Medicine

## 2022-01-28 ENCOUNTER — Ambulatory Visit: Payer: Medicare PPO | Admitting: Family Medicine

## 2022-01-28 VITALS — BP 130/64 | HR 85 | Temp 97.6°F | Ht 61.0 in | Wt 112.2 lb

## 2022-01-28 DIAGNOSIS — F325 Major depressive disorder, single episode, in full remission: Secondary | ICD-10-CM

## 2022-01-28 DIAGNOSIS — E1165 Type 2 diabetes mellitus with hyperglycemia: Secondary | ICD-10-CM | POA: Diagnosis not present

## 2022-01-28 DIAGNOSIS — E785 Hyperlipidemia, unspecified: Secondary | ICD-10-CM | POA: Diagnosis not present

## 2022-01-28 DIAGNOSIS — E559 Vitamin D deficiency, unspecified: Secondary | ICD-10-CM

## 2022-01-28 LAB — POCT GLYCOSYLATED HEMOGLOBIN (HGB A1C): Hemoglobin A1C: 8 % — AB (ref 4.0–5.6)

## 2022-01-28 MED ORDER — RYBELSUS 14 MG PO TABS: 14.0000 mg | ORAL_TABLET | Freq: Every day | ORAL | 5 refills | Status: DC

## 2022-01-28 NOTE — Patient Instructions (Addendum)
Please get your Diabetic Eye Exam scheduled.  diabetes improving but not quite to goal -currently taking metformin '500mg'$  XR and rybelsus 7 mg -increase metformin to 2 pills or '1000mg'$  XR -increase rybelsus to 14 mg (new pill set in) -switch to [facility] later for rybelsus if you tolerate higher dose as may be cheaper- you can message me anytime if you are ready for [place] to send ofr 90 days  Health Maintenance Due  Topic Date Due   Medicare Annual Wellness (AWV)  02/25/2022  You are eligible to schedule your annual wellness visit with our nurse specialist Otila Kluver.  Please consider scheduling this before you leave today  Please stop by lab before you go If you have mychart- we will send your results within 3 business days of [place] receiving them.  If you do not have mychart- we will call you about results within 5 business days of [place] receiving them.  *please also note that you will see labs on mychart as soon as they post. I will later go in and write notes on them- will say "notes from Dr. Yong Channel"   Recommended follow up: Return in about 14 weeks (around 05/06/2022) for followup or sooner if needed.Schedule b4 you leave.  -another 14 weeks out from then schedule a physical

## 2022-01-28 NOTE — Progress Notes (Signed)
Phone [phone] In person visit   Subjective:   Gabrielle Robinson is a 81 y.o. year old very pleasant female patient who presents for/with See problem oriented charting Chief Complaint  Patient presents with   Follow-up   Diabetes    Past Medical History-  Patient Active Problem List   Diagnosis Date Noted   History of melanoma 07/27/2021    Priority: High   Diabetes mellitus ([place]) 04/01/2013    Priority: High   Vitamin D deficiency 10/22/2021    Priority: Medium    Atherosclerosis of aorta ([place]) 07/27/2021    Priority: Medium    Major depressive disorder with single episode, in full remission ([place]) 05/28/2015    Priority: Medium    Hyperlipemia 01/05/2009    Priority: Medium    Bilateral shoulder pain 05/28/2015    Priority: Low    Medications- reviewed and updated Current Outpatient Medications  Medication Sig Dispense Refill   metFORMIN (GLUCOPHAGE XR) 500 MG 24 hr tablet Take 2 tablets (1,000 mg total) by mouth daily with breakfast. 180 tablet 3   Multiple Vitamin (MULTIVITAMIN WITH MINERALS) TABS tablet Take 1 tablet by mouth daily.     OVER THE COUNTER MEDICATION Sleep aid     Polyethyl Glycol-Propyl Glycol (SYSTANE ULTRA OP) Place 1 drop into both eyes daily as needed (dry eyes).     pravastatin (PRAVACHOL) 40 MG tablet Take 1 tablet (40 mg total) by mouth daily. 90 tablet 3   Semaglutide (RYBELSUS) 14 MG TABS Take 1 tablet (14 mg total) by mouth daily. 30 tablet 5   No current facility-administered medications for this visit.     Objective:  BP 130/64   Pulse 85   Temp 97.6 F (36.4 C)   Ht '5\' 1"'$  (1.549 m)   Wt 112 lb 3.2 oz (50.9 kg)   SpO2 96%   BMI 21.20 kg/m  Gen: NAD, resting comfortably CV: RRR no murmurs rubs or gallops Lungs: CTAB no crackles, wheeze, rhonchi Skin: warm, dry     Assessment and Plan   # Diabetes- peak a1c of 10.7 in November 2022 off meds S: Medication:Metformin 1000 mg extended release (only taking 500), Rybelsus 7  mg CBGs- not checking- prefers not to check Exercise and diet- some walking 1 block for 5 minutes 3-4 days a week Lab Results  Component Value Date   HGBA1C 8.0 (A) 01/28/2022   HGBA1C 9.3 (A) 10/22/2021   HGBA1C 10.0 (H) 07/19/2021   A/P: diabetes improving but not quite to goal -currently taking metformin '500mg'$  XR and rybelsus 7 mg -increase metformin to 2 pills or '1000mg'$  XR -increase rybelsus to 14 mg (new pill set in) -switch to [facility] later for rybelsus if you tolerate higher dose as may be cheaper- you can message me anytime if you are ready for [place] to send ofr 90 days  #hyperlipidemia #aortic atherosclerosis - LDL goal 70 S: Medication:Pravastatin 40 mg Lab Results  Component Value Date   CHOL 238 (H) 07/19/2021   HDL 61.50 07/19/2021   LDLCALC 143 (H) 07/19/2021   LDLDIRECT 162.3 01/01/2010   TRIG 169.0 (H) 07/19/2021   CHOLHDL 4 07/19/2021   A/P: on last LDL check was above goal but was not taking medicine consistnetly- hopefully improved- update direct LDLD   #Vitamin D deficiency S: Medication: Previously treated with high-dose vitamin D 50,000 units for 12 weeks in November - on 1k units now Last vitamin D Lab Results  Component Value Date   VD25OH 20 (L)  02/17/2021  A/P: hopefully improved- update vitamin D today. Continue current meds for now   Recommended follow up: Return in about 14 weeks (around 05/06/2022) for followup or sooner if needed.Schedule b4 you leave. Future Appointments  Date Time Provider [place]  02/28/2022 12:30 PM [facility] HEALTH ADVISOR [facility]  05/06/2022 10:00 AM Wyatt Portela, MD [facility] None   Lab/Order associations:   ICD-10-CM   1. Type 2 diabetes mellitus with hyperglycemia, without long-term current use of insulin (HCC)  E11.65 POCT glycosylated hemoglobin (Hb A1C)    2. Hyperlipidemia, unspecified hyperlipidemia type  E78.5     3. Vitamin D deficiency  E55.9     4. Major depressive disorder with single  episode, in full remission ([place])  F32.5       Meds ordered this encounter  Medications   Semaglutide (RYBELSUS) 14 MG TABS    Sig: Take 1 tablet (14 mg total) by mouth daily.    Dispense:  30 tablet    Refill:  5    Return precautions advised.  Garret Reddish, MD

## 2022-01-29 LAB — CBC WITH DIFFERENTIAL/PLATELET
Absolute Monocytes: 1204 cells/uL — ABNORMAL HIGH (ref 200–950)
Basophils Absolute: 83 cells/uL (ref 0–200)
Basophils Relative: 0.7 %
Eosinophils Absolute: 330 cells/uL (ref 15–500)
Eosinophils Relative: 2.8 %
HCT: 41.1 % (ref 35.0–45.0)
Hemoglobin: 14 g/dL (ref 11.7–15.5)
Lymphs Abs: 2513 cells/uL (ref 850–3900)
MCH: 30 pg (ref 27.0–33.0)
MCHC: 34.1 g/dL (ref 32.0–36.0)
MCV: 88.2 fL (ref 80.0–100.0)
MPV: 9.3 fL (ref 7.5–12.5)
Monocytes Relative: 10.2 %
Neutro Abs: 7670 cells/uL (ref 1500–7800)
Neutrophils Relative %: 65 %
Platelets: 304 10*3/uL (ref 140–400)
RBC: 4.66 10*6/uL (ref 3.80–5.10)
RDW: 11.8 % (ref 11.0–15.0)
Total Lymphocyte: 21.3 %
WBC: 11.8 10*3/uL — ABNORMAL HIGH (ref 3.8–10.8)

## 2022-01-29 LAB — COMPREHENSIVE METABOLIC PANEL
AG Ratio: 1.4 (calc) (ref 1.0–2.5)
ALT: 12 U/L (ref 6–29)
AST: 12 U/L (ref 10–35)
Albumin: 4.1 g/dL (ref 3.6–5.1)
Alkaline phosphatase (APISO): 80 U/L (ref 37–153)
BUN: 13 mg/dL (ref 7–25)
CO2: 26 mmol/L (ref 20–32)
Calcium: 9.9 mg/dL (ref 8.6–10.4)
Chloride: 95 mmol/L — ABNORMAL LOW (ref 98–110)
Creat: 0.9 mg/dL (ref 0.60–0.95)
Globulin: 2.9 g/dL (calc) (ref 1.9–3.7)
Glucose, Bld: 178 mg/dL — ABNORMAL HIGH (ref 65–99)
Potassium: 4.8 mmol/L (ref 3.5–5.3)
Sodium: 133 mmol/L — ABNORMAL LOW (ref 135–146)
Total Bilirubin: 0.3 mg/dL (ref 0.2–1.2)
Total Protein: 7 g/dL (ref 6.1–8.1)

## 2022-01-29 LAB — VITAMIN D 25 HYDROXY (VIT D DEFICIENCY, FRACTURES): Vit D, 25-Hydroxy: 40 ng/mL (ref 30–100)

## 2022-01-29 LAB — LDL CHOLESTEROL, DIRECT: Direct LDL: 93 mg/dL (ref <100)

## 2022-03-02 ENCOUNTER — Telehealth: Payer: Self-pay | Admitting: Oncology

## 2022-03-02 NOTE — Telephone Encounter (Signed)
Called patient per dr. Alen Blew transition. Patient not interested in coming back for f/u. Did not inform of Dr. Alen Blew moving. Patient wanted to cancel all f/u visits.

## 2022-03-07 DIAGNOSIS — D225 Melanocytic nevi of trunk: Secondary | ICD-10-CM | POA: Diagnosis not present

## 2022-03-07 DIAGNOSIS — L570 Actinic keratosis: Secondary | ICD-10-CM | POA: Diagnosis not present

## 2022-03-07 DIAGNOSIS — D2272 Melanocytic nevi of left lower limb, including hip: Secondary | ICD-10-CM | POA: Diagnosis not present

## 2022-03-07 DIAGNOSIS — D2262 Melanocytic nevi of left upper limb, including shoulder: Secondary | ICD-10-CM | POA: Diagnosis not present

## 2022-03-07 DIAGNOSIS — D2271 Melanocytic nevi of right lower limb, including hip: Secondary | ICD-10-CM | POA: Diagnosis not present

## 2022-03-07 DIAGNOSIS — D2261 Melanocytic nevi of right upper limb, including shoulder: Secondary | ICD-10-CM | POA: Diagnosis not present

## 2022-03-07 DIAGNOSIS — D1801 Hemangioma of skin and subcutaneous tissue: Secondary | ICD-10-CM | POA: Diagnosis not present

## 2022-03-07 DIAGNOSIS — L814 Other melanin hyperpigmentation: Secondary | ICD-10-CM | POA: Diagnosis not present

## 2022-03-07 DIAGNOSIS — L821 Other seborrheic keratosis: Secondary | ICD-10-CM | POA: Diagnosis not present

## 2022-04-04 ENCOUNTER — Encounter: Payer: Self-pay | Admitting: Family Medicine

## 2022-04-04 ENCOUNTER — Ambulatory Visit (INDEPENDENT_AMBULATORY_CARE_PROVIDER_SITE_OTHER): Payer: Medicare PPO | Admitting: Family Medicine

## 2022-04-04 VITALS — BP 138/90 | HR 78 | Temp 98.0°F | Ht 61.0 in | Wt 108.0 lb

## 2022-04-04 DIAGNOSIS — I700 Atherosclerosis of aorta: Secondary | ICD-10-CM

## 2022-04-04 DIAGNOSIS — E1165 Type 2 diabetes mellitus with hyperglycemia: Secondary | ICD-10-CM

## 2022-04-04 DIAGNOSIS — E785 Hyperlipidemia, unspecified: Secondary | ICD-10-CM | POA: Diagnosis not present

## 2022-04-04 DIAGNOSIS — F325 Major depressive disorder, single episode, in full remission: Secondary | ICD-10-CM

## 2022-04-04 DIAGNOSIS — R413 Other amnesia: Secondary | ICD-10-CM

## 2022-04-04 DIAGNOSIS — Z79899 Other long term (current) drug therapy: Secondary | ICD-10-CM | POA: Diagnosis not present

## 2022-04-04 NOTE — Progress Notes (Signed)
Phone [phone] In person visit   Subjective:   Gabrielle Robinson is a 82 y.o. year old very pleasant female patient who presents for/with See problem oriented charting Chief Complaint  Patient presents with   confusion    Pt states she has been more confused than normal lately and would like to discuss any medication or possible testing to help with this.    Past Medical History-  Patient Active Problem List   Diagnosis Date Noted   History of melanoma 07/27/2021    Priority: High   Diabetes mellitus ([place]) 04/01/2013    Priority: High   Vitamin D deficiency 10/22/2021    Priority: Medium    Atherosclerosis of aorta ([place]) 07/27/2021    Priority: Medium    Major depressive disorder with single episode, in full remission ([place]) 05/28/2015    Priority: Medium    Hyperlipemia 01/05/2009    Priority: Medium    Bilateral shoulder pain 05/28/2015    Priority: Low    Medications- reviewed and updated Current Outpatient Medications  Medication Sig Dispense Refill   metFORMIN (GLUCOPHAGE XR) 500 MG 24 hr tablet Take 2 tablets (1,000 mg total) by mouth daily with breakfast. 180 tablet 3   Multiple Vitamin (MULTIVITAMIN WITH MINERALS) TABS tablet Take 1 tablet by mouth daily.     OVER THE COUNTER MEDICATION Sleep aid     Polyethyl Glycol-Propyl Glycol (SYSTANE ULTRA OP) Place 1 drop into both eyes daily as needed (dry eyes).     pravastatin (PRAVACHOL) 40 MG tablet Take 1 tablet (40 mg total) by mouth daily. 90 tablet 3   Semaglutide (RYBELSUS) 14 MG TABS Take 1 tablet (14 mg total) by mouth daily. 30 tablet 5   No current facility-administered medications for this visit.     Objective:  BP (!) 138/90   Pulse 78   Temp 98 F (36.7 C)   Ht '5\' 1"'$  (1.549 m)   Wt 108 lb (49 kg)   SpO2 96%   BMI 20.41 kg/m  Gen: NAD, resting comfortably CV: RRR no murmurs rubs or gallops Lungs: CTAB no crackles, wheeze, rhonchi Abdomen: soft/nontender/nondistended/normal bowel sounds. No rebound  or guarding.  Ext: no edema Skin: warm, dry Neuro: CN II-XII intact, sensation and reflexes normal throughout, 5/5 muscle strength in bilateral upper and lower extremities. Normal finger to nose. Normal rapid alternating movements. No pronator drift. Normal romberg. Normal gait.      Assessment and Plan   # Memory concerns/confusion S: Husband reports patient has been more confused lately-more than usual. First noted a drop off 2-3 months ago with no worsening since that time.  - has moments of confusion for instance coming back from [place] and felt like hse was on a different road than she was on A/P: MMSE 23 out of 30 today - Refer to neurology - MRI of the brain- Given reported drop off perhaps 2 to 3 months ago wonder about possibility of vascular dementia.  Also with history of melanoma check for mets in the brain but strongly doubt - Labs as below -In particular with chronic metformin use despite taking B12 1 to rule out B12 deficiency -Neurological exam largely reassuring - History of depression but no recent issues-do not think this is the cause  # Elevated blood pressure-may have been high due to gravity of topic but patient/spouse agreed to monitor at home over the next week and let me know   # Diabetes-has been improving but not quite due for repeat  A1c.  Also scheduled for diabetic eye exam in the next few months  #hyperlipidemia #aortic atherosclerosis - LDL goal 70 S: Medication:Pravastatin 40 mg Lab Results  Component Value Date   CHOL 238 (H) 07/19/2021   HDL 61.50 07/19/2021   LDLCALC 143 (H) 07/19/2021   LDLDIRECT 93 01/28/2022   TRIG 169.0 (H) 07/19/2021   CHOLHDL 4 07/19/2021  A/P: Discussed statins can be associated with memory loss but if this were to be vascular dementia and certainly would not want to stop statin-defer any potential changes to at least after MRI of the brain.  With aortic atherosclerosis which we presume is stable prefer LDL under 70   #  Depression S: Medication:Lexapro  5 mg in the past- came off around march 2023 and doing well     01/28/2022    3:29 PM 10/22/2021    4:21 PM 07/27/2021    2:58 PM  Depression screen PHQ 2/9  Decreased Interest 0 1 0  Down, Depressed, Hopeless 0 1 0  PHQ - 2 Score 0 2 0  Altered sleeping 0 1 0  Tired, decreased energy 0 1 0  Change in appetite 0 1 0  Feeling bad or failure about yourself  0 0 0  Trouble concentrating 0 0 0  Moving slowly or fidgety/restless 0 0 0  Suicidal thoughts 0 0 0  PHQ-9 Score 0 5 0  Difficult doing work/chores Not difficult at all Not difficult at all Not difficult at all  A/P: No reported recurrence-continue to monitor.  Remains in full remission  # Prior white count elevation-update with labs today  Recommended follow up: Return for next already scheduled visit or sooner if needed. Future Appointments  Date Time Provider [place]  04/05/2022  3:30 PM [facility] HEALTH COACH [facility]  05/05/2022  2:20 PM Marin Olp, MD [facility]   Lab/Order associations:   ICD-10-CM   1. Memory loss  R41.3 MR BRAIN WO CONTRAST    Vitamin B12    TSH    Comprehensive metabolic panel    CBC with Differential/Platelet    Ambulatory referral to Neurology    2. Hyperlipidemia, unspecified hyperlipidemia type  E78.5 TSH    Comprehensive metabolic panel    CBC with Differential/Platelet    3. High risk medication use  Z79.899 Vitamin B12    4. Major depressive disorder with single episode, in full remission ([place])  F32.5     5. Type 2 diabetes mellitus with hyperglycemia, without long-term current use of insulin (HCC)  E11.65     6. Atherosclerosis of aorta (HCC) Chronic I70.0      Return precautions advised.  Garret Reddish, MD

## 2022-04-04 NOTE — Patient Instructions (Addendum)
Get Diabetic Eye Exam Scheduled.  We will call you within two weeks about your referral to neurology. If you do not hear within 2 weeks, give [place] a call. If possible have MRI done before neurology visit  We will call you within two weeks about your referral to MRI brain through [facility].  Their phone number is [phone].  Please call them if you have not heard in 1-2 weeks  Please stop by lab before you go If you have mychart- we will send your results within 3 business days of [place] receiving them.  If you do not have mychart- we will call you about results within 5 business days of [place] receiving them.  *please also note that you will see labs on mychart as soon as they post. I will later go in and write notes on them- will say "notes from Dr. Yong Channel"   Recommended follow up: Return for next already scheduled visit or sooner if needed.

## 2022-04-05 ENCOUNTER — Ambulatory Visit (INDEPENDENT_AMBULATORY_CARE_PROVIDER_SITE_OTHER): Payer: Medicare PPO

## 2022-04-05 DIAGNOSIS — Z0000 Encounter for general adult medical examination without abnormal findings: Secondary | ICD-10-CM | POA: Diagnosis not present

## 2022-04-05 LAB — CBC WITH DIFFERENTIAL/PLATELET
Basophils Absolute: 0.1 10*3/uL (ref 0.0–0.1)
Basophils Relative: 1.2 % (ref 0.0–3.0)
Eosinophils Absolute: 0.3 10*3/uL (ref 0.0–0.7)
Eosinophils Relative: 2.4 % (ref 0.0–5.0)
HCT: 41.5 % (ref 36.0–46.0)
Hemoglobin: 13.7 g/dL (ref 12.0–15.0)
Lymphocytes Relative: 18.7 % (ref 12.0–46.0)
Lymphs Abs: 2.2 10*3/uL (ref 0.7–4.0)
MCHC: 33.1 g/dL (ref 30.0–36.0)
MCV: 89.8 fl (ref 78.0–100.0)
Monocytes Absolute: 0.8 10*3/uL (ref 0.1–1.0)
Monocytes Relative: 7.1 % (ref 3.0–12.0)
Neutro Abs: 8.3 10*3/uL — ABNORMAL HIGH (ref 1.4–7.7)
Neutrophils Relative %: 70.6 % (ref 43.0–77.0)
Platelets: 337 10*3/uL (ref 150.0–400.0)
RBC: 4.62 Mil/uL (ref 3.87–5.11)
RDW: 13.1 % (ref 11.5–15.5)
WBC: 11.8 10*3/uL — ABNORMAL HIGH (ref 4.0–10.5)

## 2022-04-05 LAB — COMPREHENSIVE METABOLIC PANEL
ALT: 20 U/L (ref 0–35)
AST: 20 U/L (ref 0–37)
Albumin: 4.3 g/dL (ref 3.5–5.2)
Alkaline Phosphatase: 70 U/L (ref 39–117)
BUN: 13 mg/dL (ref 6–23)
CO2: 29 mEq/L (ref 19–32)
Calcium: 10.1 mg/dL (ref 8.4–10.5)
Chloride: 102 mEq/L (ref 96–112)
Creatinine, Ser: 0.9 mg/dL (ref 0.40–1.20)
GFR: 59.56 mL/min — ABNORMAL LOW (ref >60.00)
Glucose, Bld: 199 mg/dL — ABNORMAL HIGH (ref 70–99)
Potassium: 5.5 mEq/L — ABNORMAL HIGH (ref 3.5–5.1)
Sodium: 141 mEq/L (ref 135–145)
Total Bilirubin: 0.4 mg/dL (ref 0.2–1.2)
Total Protein: 7.2 g/dL (ref 6.0–8.3)

## 2022-04-05 LAB — VITAMIN B12: Vitamin B-12: 1057 pg/mL — ABNORMAL HIGH (ref 211–911)

## 2022-04-05 LAB — TSH: TSH: 2.89 u[IU]/mL (ref 0.35–5.50)

## 2022-04-05 NOTE — Patient Instructions (Signed)
Gabrielle Robinson , Thank you for taking time to come for your Medicare Wellness Visit. I appreciate your ongoing commitment to your health goals. Please review the following plan we discussed and let me know if I can assist you in the future.   These are the goals we discussed:  Goals      Exercise 150 min/wk Moderate Activity     Call parks and recreation the women's walking group  Or join club.     Patient Stated     Start walking         This is a list of the screening recommended for you and due dates:  Health Maintenance  Topic Date Due   Eye exam for diabetics  07/25/2015   COVID-19 Vaccine (3 - Pfizer risk series) 04/20/2022*   Zoster (Shingles) Vaccine (1 of 2) 04/30/2022*   Yearly kidney health urinalysis for diabetes  07/20/2022   Complete foot exam   07/28/2022   Hemoglobin A1C  07/29/2022   Yearly kidney function blood test for diabetes  04/05/2023   Medicare Annual Wellness Visit  04/06/2023   DTaP/Tdap/Td vaccine (4 - Tdap) 12/06/2027   Pneumonia Vaccine  Completed   Flu Shot  Completed   DEXA scan (bone density measurement)  Completed   HPV Vaccine  Aged Out  *Topic was postponed. The date shown is not the original due date.    Advanced directives: Advance directive discussed with you today. Even though you declined this today please call our office should you change your mind and we can give you the proper paperwork for you to fill out.  Conditions/risks identified: start walking more   Next appointment: Follow up in one year for your annual wellness visit    Preventive Care 65 Years and Older, Female Preventive care refers to lifestyle choices and visits with your health care provider that can promote health and wellness. What does preventive care include? A yearly physical exam. This is also called an annual well check. Dental exams once or twice a year. Routine eye exams. Ask your health care provider how often you should have your eyes checked. Personal  lifestyle choices, including: Daily care of your teeth and gums. Regular physical activity. Eating a healthy diet. Avoiding tobacco and drug use. Limiting alcohol use. Practicing safe sex. Taking low-dose aspirin every day. Taking vitamin and mineral supplements as recommended by your health care provider. What happens during an annual well check? The services and screenings done by your health care provider during your annual well check will depend on your age, overall health, lifestyle risk factors, and family history of disease. Counseling  Your health care provider may ask you questions about your: Alcohol use. Tobacco use. Drug use. Emotional well-being. Home and relationship well-being. Sexual activity. Eating habits. History of falls. Memory and ability to understand (cognition). Work and work Statistician. Reproductive health. Screening  You may have the following tests or measurements: Height, weight, and BMI. Blood pressure. Lipid and cholesterol levels. These may be checked every 5 years, or more frequently if you are over 39 years old. Skin check. Lung cancer screening. You may have this screening every year starting at age 33 if you have a 30-pack-year history of smoking and currently smoke or have quit within the past 15 years. Fecal occult blood test (FOBT) of the stool. You may have this test every year starting at age 72. Flexible sigmoidoscopy or colonoscopy. You may have a sigmoidoscopy every 5 years or a colonoscopy every  10 years starting at age 29. Hepatitis C blood test. Hepatitis B blood test. Sexually transmitted disease (STD) testing. Diabetes screening. This is done by checking your blood sugar (glucose) after you have not eaten for a while (fasting). You may have this done every 1-3 years. Bone density scan. This is done to screen for osteoporosis. You may have this done starting at age 83. Mammogram. This may be done every 1-2 years. Talk to your  health care provider about how often you should have regular mammograms. Talk with your health care provider about your test results, treatment options, and if necessary, the need for more tests. Vaccines  Your health care provider may recommend certain vaccines, such as: Influenza vaccine. This is recommended every year. Tetanus, diphtheria, and acellular pertussis (Tdap, Td) vaccine. You may need a Td booster every 10 years. Zoster vaccine. You may need this after age 26. Pneumococcal 13-valent conjugate (PCV13) vaccine. One dose is recommended after age 62. Pneumococcal polysaccharide (PPSV23) vaccine. One dose is recommended after age 72. Talk to your health care provider about which screenings and vaccines you need and how often you need them. This information is not intended to replace advice given to you by your health care provider. Make sure you discuss any questions you have with your health care provider. Document Released: 04/10/2015 Document Revised: 12/02/2015 Document Reviewed: 01/13/2015 Elsevier Interactive Patient Education  2017 [place] Prevention in the Home Falls can cause injuries. They can happen to people of all ages. There are many things you can do to make your home safe and to help prevent falls. What can I do on the outside of my home? Regularly fix the edges of walkways and driveways and fix any cracks. Remove anything that might make you trip as you walk through a door, such as a raised step or threshold. Trim any bushes or trees on the path to your home. Use bright outdoor lighting. Clear any walking paths of anything that might make someone trip, such as rocks or tools. Regularly check to see if handrails are loose or broken. Make sure that both sides of any steps have handrails. Any raised decks and porches should have guardrails on the edges. Have any leaves, snow, or ice cleared regularly. Use sand or salt on walking paths during winter. Clean  up any spills in your garage right away. This includes oil or grease spills. What can I do in the bathroom? Use night lights. Install grab bars by the toilet and in the tub and shower. Do not use towel bars as grab bars. Use non-skid mats or decals in the tub or shower. If you need to sit down in the shower, use a plastic, non-slip stool. Keep the floor dry. Clean up any water that spills on the floor as soon as it happens. Remove soap buildup in the tub or shower regularly. Attach bath mats securely with double-sided non-slip rug tape. Do not have throw rugs and other things on the floor that can make you trip. What can I do in the bedroom? Use night lights. Make sure that you have a light by your bed that is easy to reach. Do not use any sheets or blankets that are too big for your bed. They should not hang down onto the floor. Have a firm chair that has side arms. You can use this for support while you get dressed. Do not have throw rugs and other things on the floor that can make you  trip. What can I do in the kitchen? Clean up any spills right away. Avoid walking on wet floors. Keep items that you use a lot in easy-to-reach places. If you need to reach something above you, use a strong step stool that has a grab bar. Keep electrical cords out of the way. Do not use floor polish or wax that makes floors slippery. If you must use wax, use non-skid floor wax. Do not have throw rugs and other things on the floor that can make you trip. What can I do with my stairs? Do not leave any items on the stairs. Make sure that there are handrails on both sides of the stairs and use them. Fix handrails that are broken or loose. Make sure that handrails are as long as the stairways. Check any carpeting to make sure that it is firmly attached to the stairs. Fix any carpet that is loose or worn. Avoid having throw rugs at the top or bottom of the stairs. If you do have throw rugs, attach them to the  floor with carpet tape. Make sure that you have a light switch at the top of the stairs and the bottom of the stairs. If you do not have them, ask someone to add them for you. What else can I do to help prevent falls? Wear shoes that: Do not have high heels. Have rubber bottoms. Are comfortable and fit you well. Are closed at the toe. Do not wear sandals. If you use a stepladder: Make sure that it is fully opened. Do not climb a closed stepladder. Make sure that both sides of the stepladder are locked into place. Ask someone to hold it for you, if possible. Clearly mark and make sure that you can see: Any grab bars or handrails. First and last steps. Where the edge of each step is. Use tools that help you move around (mobility aids) if they are needed. These include: Canes. Walkers. Scooters. Crutches. Turn on the lights when you go into a dark area. Replace any light bulbs as soon as they burn out. Set up your furniture so you have a clear path. Avoid moving your furniture around. If any of your floors are uneven, fix them. If there are any pets around you, be aware of where they are. Review your medicines with your doctor. Some medicines can make you feel dizzy. This can increase your chance of falling. Ask your doctor what other things that you can do to help prevent falls. This information is not intended to replace advice given to you by your health care provider. Make sure you discuss any questions you have with your health care provider. Document Released: 01/08/2009 Document Revised: 08/20/2015 Document Reviewed: 04/18/2014 Elsevier Interactive Patient Education  2017 [employer].

## 2022-04-05 NOTE — Progress Notes (Signed)
I connected with  Gabrielle Robinson on 04/05/22 by a audio enabled telemedicine application and verified that I am speaking with the correct person using two identifiers.  Patient Location: Home  Provider Location: Office/Clinic  I discussed the limitations of evaluation and management by telemedicine. The patient expressed understanding and agreed to proceed.\  Subjective:   Gabrielle Robinson is a 82 y.o. female who presents for Medicare Annual (Subsequent) preventive examination.  Review of Systems     Cardiac Risk Factors include: advanced age (>47mn, >>66women);diabetes mellitus;dyslipidemia     Objective:    There were no vitals filed for this visit. There is no height or weight on file to calculate BMI.     04/05/2022    3:40 PM 02/25/2021    2:28 PM 02/25/2020    2:11 PM 01/30/2018   10:32 AM 12/05/2017    4:26 PM 12/05/2017    4:10 PM 09/17/2014    9:40 AM  Advanced Directives  Does Patient Have a Medical Advance Directive? No No No No No No No  Would patient like information on creating a medical advance directive? No - Patient declined No - Patient declined No - Patient declined Yes (MAU/Ambulatory/Procedural Areas - Information given)   Yes - Educational materials given    Current Medications (verified) Outpatient Encounter Medications as of 04/05/2022  Medication Sig   Cyanocobalamin (VITAMIN B 12 PO) Take by mouth.   metFORMIN (GLUCOPHAGE XR) 500 MG 24 hr tablet Take 2 tablets (1,000 mg total) by mouth daily with breakfast.   OVER THE COUNTER MEDICATION Sleep aid   Polyethyl Glycol-Propyl Glycol (SYSTANE ULTRA OP) Place 1 drop into both eyes daily as needed (dry eyes).   pravastatin (PRAVACHOL) 40 MG tablet Take 1 tablet (40 mg total) by mouth daily.   Semaglutide (RYBELSUS) 14 MG TABS Take 1 tablet (14 mg total) by mouth daily.   Multiple Vitamin (MULTIVITAMIN WITH MINERALS) TABS tablet Take 1 tablet by mouth daily. (Patient not taking: Reported on 04/05/2022)   No  facility-administered encounter medications on file as of 04/05/2022.    Allergies (verified) Patient has no known allergies.   History: Past Medical History:  Diagnosis Date   Cancer ([facility]    Left Hip Melanoma   Diabetes mellitus without complication ([facility]    Hyperlipemia 01/05/2009   Qualifier: Diagnosis of  By: JArnoldo MoraleMD, JBalinda Quails   Major depressive disorder with single episode, in full remission ([facility] 05/28/2015   Past Surgical History:  Procedure Laterality Date   MELANOMA EXCISION Left 02/08/2018   Procedure: WIDE LOCAL EXCISION WITH ADVANCEMENT FLAP CLOSURE OF LEFT HIP MELANOMA ERAS PATHWAY;  Surgeon: BStark Klein MD;  [facility]  Service: General;  Laterality: Left;   SENTINEL NODE BIOPSY Left 02/08/2018   Procedure: LEFT INGUINAL SENTINEL LYMPH NODE BIOPSY;  Surgeon: BStark Klein MD;  [facility]  Service: General;  Laterality: Left;   Family History  Problem Relation Age of Onset   Diabetes Mellitus II Mother        died of "old age" right at 898  Diabetes Mellitus II Father        died of "old age" in 886s  Melanoma Sister    Breast cancer Maternal Grandmother    Renal cancer Maternal Grandfather    Social History   Socioeconomic History   Marital status: Married    Spouse name: Not on file   Number of children: Not on file   Years  of education: Not on file   Highest education level: Bachelor's degree (e.g., BA, AB, BS)  Occupational History   Not on file  Tobacco Use   Smoking status: Never   Smokeless tobacco: Never  Vaping Use   Vaping Use: Never used  Substance and Sexual Activity   Alcohol use: Yes    Alcohol/week: 14.0 standard drinks of alcohol    Types: 14 Glasses of wine per week    Comment: encouraged max 1 per day   Drug use: No   Sexual activity: Yes    Partners: Male    Comment: husband  Other Topics Concern   Not on file  Social History Narrative   Home Situation: lives with husband. 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)       Hobbies: reading, enjoys time of at home      Social Determinants of Health   Financial Resource Strain: Low Risk  (04/05/2022)   Overall Financial Resource Strain (CARDIA)    Difficulty of Paying Living Expenses: Not hard at all  Food Insecurity: No Food Insecurity (04/05/2022)   Hunger Vital Sign    Worried About Running Out of Food in the Last Year: Never true    [place] in the Last Year: Never true  Transportation Needs: No Transportation Needs (04/05/2022)   PRAPARE - Hydrologist (Medical): No    Lack of Transportation (Non-Medical): No  Physical Activity: Insufficiently Active (04/05/2022)   Exercise Vital Sign    Days of Exercise per Week: 2 days    Minutes of Exercise per Session: 10 min  Stress: No Stress Concern Present (04/05/2022)   [place]    Feeling of Stress : Not at all  Social Connections: [place] (04/05/2022)   Social Connection and Isolation Panel [NHANES]    Frequency of Communication with Friends and Family: More than three times a week    Frequency of Social Gatherings with Friends and Family: More than three times a week    Attends Religious Services: 1 to 4 times per year    Active Member of [employer] or Organizations: Yes    Attends Archivist Meetings: 1 to 4 times per year    Marital Status: Married    Tobacco Counseling Counseling given: Not Answered   Clinical Intake:  Pre-visit preparation completed: Yes  Pain : No/denies pain     BMI - recorded: 20.41 Nutritional Status: BMI of 19-24  Normal Nutritional Risks: None Diabetes: Yes CBG done?: No Did pt. bring in CBG monitor from home?: No  How often do you need to have someone help you when you read instructions, pamphlets, or other written materials from your doctor or  pharmacy?: 1 - Never  Diabetic?Nutrition Risk Assessment:  Has the patient had any N/V/D within the last 2 months?  No  Does the patient have any non-healing wounds?  No  Has the patient had any unintentional weight loss or weight gain?  No   Diabetes:  Is the patient diabetic?  Yes  If diabetic, was a CBG obtained today?  No  Did the patient bring in their glucometer from home?  No  How often do you monitor your CBG's? N/A.   Financial Strains and Diabetes Management:  Are you having any financial strains with  the device, your supplies or your medication? No .  Does the patient want to be seen by Chronic Care Management for management of their diabetes?  No  Would the patient like to be referred to a Nutritionist or for Diabetic Management?  No   Diabetic Exams:  Diabetic Eye Exam: Overdue for diabetic eye exam. Pt has been advised about the importance in completing this exam. Patient advised to call and schedule an eye exam. Diabetic Foot Exam: Completed 07/27/21   Interpreter Needed?: No  Information entered by :: Charlott Rakes, LPN   Activities of Daily Living    04/05/2022    3:42 PM  In your present state of health, do you have any difficulty performing the following activities:  Hearing? 1  Comment wears hearing aids  Vision? 0  Difficulty concentrating or making decisions? 0  Walking or climbing stairs? 0  Dressing or bathing? 0  Doing errands, shopping? 0  Preparing Food and eating ? N  Using the Toilet? N  In the past six months, have you accidently leaked urine? N  Do you have problems with loss of bowel control? N  Managing your Medications? N  Managing your Finances? N  Housekeeping or managing your Housekeeping? N    Patient Care Team: Marin Olp, MD as PCP - General (Family Medicine) Wyatt Portela, MD as Consulting Physician (Oncology) Jarome Matin, MD as Consulting Physician (Dermatology)  Indicate any recent Medical Services you may have  received from other than [facility] providers in the past year (date may be approximate).     Assessment:   This is a routine wellness examination for Gabrielle Robinson.  Hearing/Vision screen Hearing Screening - Comments:: Wears hearing aids  Vision Screening - Comments:: Pt follows up with [place] [facility] exams for annual eye exams   Dietary issues and exercise activities discussed: Current Exercise Habits: Home exercise routine, Type of exercise: walking, Time (Minutes): 10, Frequency (Times/Week): 2, Weekly Exercise (Minutes/Week): 20   Goals Addressed             This Visit's Progress    Patient Stated       Start walking        Depression Screen    04/05/2022    3:39 PM 01/28/2022    3:29 PM 10/22/2021    4:21 PM 07/27/2021    2:58 PM 05/03/2021   12:47 PM 02/25/2021    2:22 PM 02/25/2021    2:14 PM  PHQ 2/9 Scores  PHQ - 2 Score 0 0 2 0 0 0 0  PHQ- 9 Score  0 5 0  0 0    Fall Risk    04/05/2022    3:41 PM 04/04/2022    3:28 PM 02/25/2021    2:22 PM 02/17/2021    3:26 PM 02/13/2021   10:57 AM  [place] in the past year? 0 0 0 0 0  Number falls in past yr: 0 0 0    Injury with Fall? 0 0 0    Risk for fall due to : Impaired vision No Fall Risks     Follow up Falls prevention discussed Falls evaluation completed       FALL RISK PREVENTION PERTAINING TO THE HOME:  Any stairs in or around the home? Yes  If so, are there any without handrails? No  Home free of loose throw rugs in walkways, pet beds, electrical cords, etc? Yes  Adequate lighting in your home to reduce risk  of falls? Yes   ASSISTIVE DEVICES UTILIZED TO PREVENT FALLS:  Life alert? No  Use of a cane, walker or w/c? No  Grab bars in the bathroom? Yes  Shower chair or bench in shower? No  Elevated toilet seat or a handicapped toilet? No   TIMED UP AND GO:  Was the test performed? No .   Cognitive Function:    10/25/2021    8:42 AM  MMSE - Mini Mental State Exam  Orientation to time 5  Orientation to  Place 5  Registration 3  Attention/ Calculation 5  Recall 2  Language- name 2 objects 2  Language- repeat 1  Language- follow 3 step command 3  Language- read & follow direction 1  Write a sentence 1  Copy design 1  Total score 29        04/05/2022    3:43 PM 02/25/2021    2:24 PM  6CIT Screen  What Year? 0 points 0 points  What month? 0 points 0 points  What time? 0 points 0 points  Count back from 20 0 points 0 points  Months in reverse 4 points 0 points  Repeat phrase 2 points 2 points  Total Score 6 points 2 points    Immunizations Immunization History  Administered Date(s) Administered   Fluad Quad(high Dose 65+) 02/05/2019, 12/10/2021   Influenza Split 12/26/2012   Influenza Whole 01/09/2008, 01/01/2010   Influenza, High Dose Seasonal PF 03/10/2014, 12/29/2014, 03/07/2016, 12/05/2017   PFIZER(Purple Top)SARS-COV-2 Vaccination 04/22/2019, 05/10/2019   Pneumococcal Conjugate-13 09/17/2014   Pneumococcal Polysaccharide-23 01/01/2010   Td 10/29/2007, 12/05/2017, 12/05/2017    TDAP status: Up to date  Flu Vaccine status: Up to date  Pneumococcal vaccine status: Up to date  Covid-19 vaccine status: Completed vaccines  Qualifies for Shingles Vaccine? Yes   Zostavax completed No   Shingrix Completed?: No.    Education has been provided regarding the importance of this vaccine. Patient has been advised to call insurance company to determine out of pocket expense if they have not yet received this vaccine. Advised may also receive vaccine at local pharmacy or Health Dept. Verbalized acceptance and understanding.  Screening Tests Health Maintenance  Topic Date Due   OPHTHALMOLOGY EXAM  07/25/2015   COVID-19 Vaccine (3 - Pfizer risk series) 04/20/2022 (Originally 06/07/2019)   Zoster Vaccines- Shingrix (1 of 2) 04/30/2022 (Originally 10/20/1959)   Diabetic kidney evaluation - Urine ACR  07/20/2022   FOOT EXAM  07/28/2022   HEMOGLOBIN A1C  07/29/2022   Diabetic kidney  evaluation - eGFR measurement  04/05/2023   Medicare Annual Wellness (AWV)  04/06/2023   DTaP/Tdap/Td (4 - Tdap) 12/06/2027   Pneumonia Vaccine 5+ Years old  Completed   INFLUENZA VACCINE  Completed   DEXA SCAN  Completed   HPV VACCINES  Aged Out    Health Maintenance  Health Maintenance Due  Topic Date Due   OPHTHALMOLOGY EXAM  07/25/2015    Colorectal cancer screening: No longer required.   Mammogram status: Completed 06/16/14. Repeat every year pt stated not unless she has to   Bone Density status: Completed 01/07/16. Results reflect: Bone density results: OSTEOPENIA. Repeat every 2 years.   Additional Screening:   Vision Screening: Recommended annual ophthalmology exams for early detection of glaucoma and other disorders of the eye. Is the patient up to date with their annual eye exam?  No  Who is the provider or what is the name of the office in which the patient attends annual  eye exams? Has an upcoming appt  If pt is not established with a provider, would they like to be referred to a provider to establish care? No .   Dental Screening: Recommended annual dental exams for proper oral hygiene  Community Resource Referral / Chronic Care Management: CRR required this visit?  No   CCM required this visit?  No      Plan:     I have personally reviewed and noted the following in the patient's chart:   Medical and social history Use of alcohol, tobacco or illicit drugs  Current medications and supplements including opioid prescriptions. Patient is not currently taking opioid prescriptions. Functional ability and status Nutritional status Physical activity Advanced directives List of other physicians Hospitalizations, surgeries, and ER visits in previous 12 months Vitals Screenings to include cognitive, depression, and falls Referrals and appointments  In addition, I have reviewed and discussed with patient certain preventive protocols, quality metrics, and  best practice recommendations. A written personalized care plan for preventive services as well as general preventive health recommendations were provided to patient.     Willette Brace, LPN   08/27/5636   Nurse Notes: none

## 2022-04-07 ENCOUNTER — Encounter: Payer: Medicare PPO | Admitting: Physician Assistant

## 2022-04-07 NOTE — Progress Notes (Incomplete)
Assessment/Plan:    The patient is seen in neurologic consultation at the request of Marin Olp, MD for the evaluation of memory.  Gabrielle Robinson is a very pleasant 82 y.o. 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 MoCA today is .   Memory Impairment  Follow up in 3 months *** MRI of the brain has been ordered by her PCP, results are currently pending. Neurocognitive testing to further evaluate cognitive concerns and determine other underlying cause of memory changes, including potential contribution from sleep, anxiety, or depression  Continue to control cardiovascular risk factors Continue to control mood as per PCP Follow-up elevated white count with elevated neutrophil count as per PCP/hematology oncology, as this has been present since the beginning of her memory complaints, rule out any inflammatory-infectious disease.  (She is not on any steroids)  Subjective:    The patient is accompanied by her husband***  who supplements the history.    How long did patient have memory difficulties?  For the last 2 to 3 months, her husband reports that he has been a decline in her memory, "she is more confused lately ".  For instance, he was coming back from [place] and she was driving, she felt like she was in a different road than the 1 she was on. repeats oneself?  Endorsed Disoriented when walking into a room?  Patient denies except occasionally not remembering what patient came to the room for ***  Leaving objects in unusual places?   denies   Wandering behavior? denies   Any personality changes since last visit? denies   Any worsening depression?:  denies   Hallucinations or paranoia?  denies   Seizures?   denies    Any sleep  changes?  Denies  vivid dreams, REM behavior or sleepwalking   Sleep apnea? denies   Any hygiene concerns?  denies   Independent of bathing and dressing?  Endorsed  Does the patient need help with medications? is in charge *** Who is in charge of the finances?   is in charge   *** Any changes in appetite?   denies ***   Patient have trouble swallowing?  denies   Does the patient cook?  Any kitchen accidents such as leaving the stove on? Patient denies   Any headaches?  denies   Chronic back pain  denies   Ambulates with difficulty?   denies   Recent falls or head injuries?  denies     Unilateral weakness, numbness or tingling?  denies   Any tremors?  denies   Any anosmia?  denies   Any incontinence of urine?  denies   Any bowel dysfunction?    denies      Patient lives  ***  History of heavy alcohol intake? denies   History of heavy tobacco use?   denies   Family history of dementia?   ***  Dose patient drive?*** Pertinent recent labs 04/04/2022 TSH 2.89, B12 1057, WBC 11.8 with ANC of 8.3 (unclear etiology) No Known Allergies  Current Outpatient Medications  Medication Instructions   Cyanocobalamin (VITAMIN B 12 PO) Oral   metFORMIN (GLUCOPHAGE XR) 1,000 mg, Oral, Daily with breakfast   Multiple Vitamin (MULTIVITAMIN WITH MINERALS) TABS tablet 1 tablet, Daily  OVER THE COUNTER MEDICATION Sleep aid    Polyethyl Glycol-Propyl Glycol (SYSTANE ULTRA OP) 1 drop, Both Eyes, Daily PRN   pravastatin (PRAVACHOL) 40 mg, Oral, Daily   Rybelsus 14 mg, Oral, Daily     VITALS:  There were no vitals filed for this visit.    04/05/2022    3:39 PM 01/28/2022    3:29 PM 10/22/2021    4:21 PM 07/27/2021    2:58 PM 05/03/2021   12:47 PM  Depression screen PHQ 2/9  Decreased Interest 0 0 1 0 0  Down, Depressed, Hopeless 0 0 1 0 0  PHQ - 2 Score 0 0 2 0 0  Altered sleeping  0 1 0   Tired, decreased energy  0 1 0   Change in appetite  0 1 0 0  Feeling bad or failure about yourself   0 0 0 0   Trouble concentrating  0 0 0 0  Moving slowly or fidgety/restless  0 0 0 0  Suicidal thoughts  0 0 0 0  PHQ-9 Score  0 5 0   Difficult doing work/chores  Not difficult at all Not difficult at all Not difficult at all     PHYSICAL EXAM   HEENT:  Normocephalic, atraumatic. The mucous membranes are moist. The superficial temporal arteries are without ropiness or tenderness. Cardiovascular: Regular rate and rhythm. Lungs: Clear to auscultation bilaterally. Neck: There are no carotid bruits noted bilaterally.  NEUROLOGICAL:     No data to display             10/25/2021    8:42 AM  MMSE - Mini Mental State Exam  Orientation to time 5  Orientation to Place 5  Registration 3  Attention/ Calculation 5  Recall 2  Language- name 2 objects 2  Language- repeat 1  Language- follow 3 step command 3  Language- read & follow direction 1  Write a sentence 1  Copy design 1  Total score 29     Orientation:  Alert and oriented to person, place and time. No aphasia or dysarthria. Fund of knowledge is appropriate. Recent memory impaired and remote memory intact.  Attention and concentration are normal.  Able to name objects and repeat phrases. Delayed recall   Cranial nerves: There is good facial symmetry. Extraocular muscles are intact and visual fields are full to confrontational testing. Speech is fluent and clear. no tongue deviation. Hearing is intact to conversational tone. Tone: Tone is good throughout. Sensation: Sensation is intact to light touch and pinprick throughout. Vibration is intact at the bilateral big toe.There is no extinction with double simultaneous stimulation. There is no sensory dermatomal level identified. Coordination: The patient has no difficulty with RAM's or FNF bilaterally. Normal finger to nose  Motor: Strength is 5/5 in the bilateral upper and lower extremities. There is no pronator drift. There are no fasciculations noted. DTR's: Deep tendon reflexes are 2/4  at the bilateral biceps, triceps, brachioradialis, patella and achilles.  Plantar responses are downgoing bilaterally. Gait and Station: The patient is able to ambulate without difficulty.The patient is able to ambulate in a tandem fashion, able to stand in the Romberg position.     Thank you for allowing [place] the opportunity to participate in the care of this nice patient. Please do not hesitate to contact us for any questions or concerns.   Total time spent on today's visit was *** minutes dedicated to this patient today, preparing to see patient, examining the patient,  ordering tests and/or medications and counseling the patient, documenting clinical information in the EHR or other health record, independently interpreting results and communicating results to the patient/family, discussing treatment and goals, answering patient's questions and coordinating care.  Cc:  Marin Olp, MD  Sharene Butters 04/07/2022 7:02 AM

## 2022-04-08 ENCOUNTER — Telehealth: Payer: Self-pay | Admitting: Oncology

## 2022-04-08 NOTE — Telephone Encounter (Signed)
Contacted patient to scheduled appointments. Patient is aware of appointments that are scheduled.   

## 2022-04-14 ENCOUNTER — Encounter: Payer: Self-pay | Admitting: Physician Assistant

## 2022-04-14 NOTE — Progress Notes (Incomplete)
Assessment/Plan:    The patient is seen in neurologic consultation at the request of Marin Olp, MD for the evaluation of memory.  Gabrielle Robinson is a very pleasant 82 y.o. 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 MoCA today is .   Memory Impairment  Follow up in 3 months *** MRI of the brain has been ordered by her PCP, results are currently pending. Neurocognitive testing to further evaluate cognitive concerns and determine other underlying cause of memory changes, including potential contribution from sleep, anxiety, or depression  Continue to control cardiovascular risk factors Continue to control mood as per PCP Follow-up elevated white count with elevated neutrophil count as per PCP/hematology oncology, as this has been present since the beginning of her memory complaints, rule out any inflammatory-infectious disease.  (She is not on any steroids)  Subjective:    The patient is accompanied by her husband***  who supplements the history.    How long did patient have memory difficulties?  For the last 2 to 3 months, her husband reports that he has been a decline in her memory, "she is more confused lately ".  For instance, he was coming back from [place] and she was driving, she felt like she was in a different road than the 1 she was on. repeats oneself?  Endorsed Disoriented when walking into a room?  Patient denies except occasionally not remembering what patient came to the room for ***  Leaving objects in unusual places?   denies   Wandering behavior? denies   Any personality changes since last visit? denies   Any worsening depression?:  denies   Hallucinations or paranoia?  denies   Seizures?   denies    Any sleep  changes?  Denies  vivid dreams, REM behavior or sleepwalking   Sleep apnea? denies   Any hygiene concerns?  denies   Independent of bathing and dressing?  Endorsed  Does the patient need help with medications? is in charge *** Who is in charge of the finances?   is in charge   *** Any changes in appetite?   denies ***   Patient have trouble swallowing?  denies   Does the patient cook?  Any kitchen accidents such as leaving the stove on? Patient denies   Any headaches?  denies   Chronic back pain  denies   Ambulates with difficulty?   denies   Recent falls or head injuries?  denies     Unilateral weakness, numbness or tingling?  denies   Any tremors?  denies   Any anosmia?  denies   Any incontinence of urine?  denies   Any bowel dysfunction?    denies      Patient lives  ***  History of heavy alcohol intake? denies   History of heavy tobacco use?   denies   Family history of dementia?   ***  Dose patient drive?*** Pertinent recent labs 04/04/2022 TSH 2.89, B12 1057, WBC 11.8 with ANC of 8.3 (unclear etiology) No Known Allergies  Current Outpatient Medications  Medication Instructions   Cyanocobalamin (VITAMIN B 12 PO) Oral   metFORMIN (GLUCOPHAGE XR) 1,000 mg, Oral, Daily with breakfast   Multiple Vitamin (MULTIVITAMIN WITH MINERALS) TABS tablet 1 tablet, Daily  OVER THE COUNTER MEDICATION Sleep aid    Polyethyl Glycol-Propyl Glycol (SYSTANE ULTRA OP) 1 drop, Both Eyes, Daily PRN   pravastatin (PRAVACHOL) 40 mg, Oral, Daily   Rybelsus 14 mg, Oral, Daily     VITALS:  There were no vitals filed for this visit.    04/05/2022    3:39 PM 01/28/2022    3:29 PM 10/22/2021    4:21 PM 07/27/2021    2:58 PM 05/03/2021   12:47 PM  Depression screen PHQ 2/9  Decreased Interest 0 0 1 0 0  Down, Depressed, Hopeless 0 0 1 0 0  PHQ - 2 Score 0 0 2 0 0  Altered sleeping  0 1 0   Tired, decreased energy  0 1 0   Change in appetite  0 1 0 0  Feeling bad or failure about yourself   0 0 0 0   Trouble concentrating  0 0 0 0  Moving slowly or fidgety/restless  0 0 0 0  Suicidal thoughts  0 0 0 0  PHQ-9 Score  0 5 0   Difficult doing work/chores  Not difficult at all Not difficult at all Not difficult at all     PHYSICAL EXAM   HEENT:  Normocephalic, atraumatic. The mucous membranes are moist. The superficial temporal arteries are without ropiness or tenderness. Cardiovascular: Regular rate and rhythm. Lungs: Clear to auscultation bilaterally. Neck: There are no carotid bruits noted bilaterally.  NEUROLOGICAL:     No data to display             10/25/2021    8:42 AM  MMSE - Mini Mental State Exam  Orientation to time 5  Orientation to Place 5  Registration 3  Attention/ Calculation 5  Recall 2  Language- name 2 objects 2  Language- repeat 1  Language- follow 3 step command 3  Language- read & follow direction 1  Write a sentence 1  Copy design 1  Total score 29     Orientation:  Alert and oriented to person, place and time. No aphasia or dysarthria. Fund of knowledge is appropriate. Recent memory impaired and remote memory intact.  Attention and concentration are normal.  Able to name objects and repeat phrases. Delayed recall   Cranial nerves: There is good facial symmetry. Extraocular muscles are intact and visual fields are full to confrontational testing. Speech is fluent and clear. no tongue deviation. Hearing is intact to conversational tone. Tone: Tone is good throughout. Sensation: Sensation is intact to light touch and pinprick throughout. Vibration is intact at the bilateral big toe.There is no extinction with double simultaneous stimulation. There is no sensory dermatomal level identified. Coordination: The patient has no difficulty with RAM's or FNF bilaterally. Normal finger to nose  Motor: Strength is 5/5 in the bilateral upper and lower extremities. There is no pronator drift. There are no fasciculations noted. DTR's: Deep tendon reflexes are 2/4  at the bilateral biceps, triceps, brachioradialis, patella and achilles.  Plantar responses are downgoing bilaterally. Gait and Station: The patient is able to ambulate without difficulty.The patient is able to ambulate in a tandem fashion, able to stand in the Romberg position.     Thank you for allowing [place] the opportunity to participate in the care of this nice patient. Please do not hesitate to contact us for any questions or concerns.   Total time spent on today's visit was *** minutes dedicated to this patient today, preparing to see patient, examining the patient,  ordering tests and/or medications and counseling the patient, documenting clinical information in the EHR or other health record, independently interpreting results and communicating results to the patient/family, discussing treatment and goals, answering patient's questions and coordinating care.  Cc:  Marin Olp, MD  Sharene Butters 04/14/2022 7:16 AM

## 2022-04-18 ENCOUNTER — Ambulatory Visit
Admission: RE | Admit: 2022-04-18 | Discharge: 2022-04-18 | Disposition: A | Discharge status: Home / Self Care | Payer: Medicare PPO | Source: Ambulatory Visit | Attending: Family Medicine | Admitting: Family Medicine

## 2022-04-18 DIAGNOSIS — G319 Degenerative disease of nervous system, unspecified: Secondary | ICD-10-CM | POA: Diagnosis not present

## 2022-04-18 DIAGNOSIS — F0390 Unspecified dementia without behavioral disturbance: Secondary | ICD-10-CM | POA: Diagnosis not present

## 2022-04-18 DIAGNOSIS — I6381 Other cerebral infarction due to occlusion or stenosis of small artery: Secondary | ICD-10-CM | POA: Diagnosis not present

## 2022-04-18 DIAGNOSIS — R413 Other amnesia: Secondary | ICD-10-CM

## 2022-04-18 DIAGNOSIS — I6782 Cerebral ischemia: Secondary | ICD-10-CM | POA: Diagnosis not present

## 2022-04-19 ENCOUNTER — Encounter: Payer: Medicare PPO | Admitting: Physician Assistant

## 2022-04-20 ENCOUNTER — Other Ambulatory Visit: Payer: Self-pay

## 2022-04-20 MED ORDER — ROSUVASTATIN CALCIUM 10 MG PO TABS: 10.0000 mg | ORAL_TABLET | Freq: Every day | ORAL | 3 refills | Status: DC

## 2022-04-21 ENCOUNTER — Encounter: Payer: Medicare PPO | Admitting: Physician Assistant

## 2022-04-26 ENCOUNTER — Encounter: Payer: Self-pay | Admitting: Physician Assistant

## 2022-05-05 ENCOUNTER — Ambulatory Visit: Payer: Medicare PPO | Admitting: Family Medicine

## 2022-05-06 ENCOUNTER — Ambulatory Visit: Payer: Medicare PPO | Admitting: Oncology

## 2022-05-09 ENCOUNTER — Encounter: Payer: Self-pay | Admitting: Physician Assistant

## 2022-05-10 ENCOUNTER — Ambulatory Visit: Payer: Medicare PPO | Admitting: Physician Assistant

## 2022-05-12 ENCOUNTER — Ambulatory Visit: Payer: Medicare PPO | Admitting: Family Medicine

## 2022-05-12 ENCOUNTER — Encounter: Payer: Self-pay | Admitting: Family Medicine

## 2022-05-12 VITALS — BP 128/79 | HR 86 | Temp 97.7°F | Ht 61.0 in | Wt 111.2 lb

## 2022-05-12 DIAGNOSIS — I700 Atherosclerosis of aorta: Secondary | ICD-10-CM | POA: Diagnosis not present

## 2022-05-12 DIAGNOSIS — E1165 Type 2 diabetes mellitus with hyperglycemia: Secondary | ICD-10-CM | POA: Diagnosis not present

## 2022-05-12 DIAGNOSIS — R413 Other amnesia: Secondary | ICD-10-CM | POA: Diagnosis not present

## 2022-05-12 DIAGNOSIS — Z8673 Personal history of transient ischemic attack (TIA), and cerebral infarction without residual deficits: Secondary | ICD-10-CM | POA: Diagnosis not present

## 2022-05-12 DIAGNOSIS — E785 Hyperlipidemia, unspecified: Secondary | ICD-10-CM

## 2022-05-12 DIAGNOSIS — F0390 Unspecified dementia without behavioral disturbance: Secondary | ICD-10-CM | POA: Insufficient documentation

## 2022-05-12 NOTE — Progress Notes (Signed)
Phone [phone] In person visit   Subjective:   Gabrielle Robinson is a 82 y.o. year old very pleasant female patient who presents for/with See problem oriented charting Chief Complaint  Patient presents with   Follow-up   Diabetes   Past Medical History-  Patient Active Problem List   Diagnosis Date Noted   History of cerebral infarction 05/12/2022    Priority: High   Memory loss 05/12/2022    Priority: High   History of melanoma 07/27/2021    Priority: High   Diabetes mellitus ([place]) 04/01/2013    Priority: High   Vitamin D deficiency 10/22/2021    Priority: Medium    Atherosclerosis of aorta ([place]) 07/27/2021    Priority: Medium    Major depressive disorder with single episode, in full remission ([place]) 05/28/2015    Priority: Medium    Hyperlipemia 01/05/2009    Priority: Medium    Bilateral shoulder pain 05/28/2015    Priority: Low    Medications- reviewed and updated Current Outpatient Medications  Medication Sig Dispense Refill   aspirin EC 81 MG tablet Take 81 mg by mouth daily. Swallow whole.     Cyanocobalamin (VITAMIN B 12 PO) Take by mouth.     metFORMIN (GLUCOPHAGE XR) 500 MG 24 hr tablet Take 2 tablets (1,000 mg total) by mouth daily with breakfast. 180 tablet 3   Multiple Vitamin (MULTIVITAMIN) capsule Take 1 capsule by mouth daily.     Polyethyl Glycol-Propyl Glycol (SYSTANE ULTRA OP) Place 1 drop into both eyes daily as needed (dry eyes).     rosuvastatin (CRESTOR) 10 MG tablet Take 1 tablet (10 mg total) by mouth daily. 90 tablet 3   Semaglutide (RYBELSUS) 14 MG TABS Take 1 tablet (14 mg total) by mouth daily. 30 tablet 5   No current facility-administered medications for this visit.     Objective:  BP 128/79 (BP Location: Right Arm, Patient Position: Sitting)   Pulse 86   Temp 97.7 F (36.5 C) (Temporal)   Ht 5' 1"$  (1.549 m)   Wt 111 lb 3.2 oz (50.4 kg)   SpO2 97%   BMI 21.01 kg/m  Gen: NAD, resting comfortably CV: RRR no murmurs rubs or  gallops Lungs: CTAB no crackles, wheeze, rhonchi Abdomen: soft/nontender/nondistended/normal bowel sounds. No rebound or guarding.  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 and mild chronic small vessel ischemic changes as well as generalized cerebral atrophy and mild cerebellar atrophy - We added aspirin 81 mg and stopped pravastatin and switch to rosuvastatin for more intensive lipid control - No reversible cause on labs  A/P: Memory loss with MMSE 23/30 with prior lacunar infarct as only potential cause discovered so far-  we have maximized  medical therapy to reduce risk of recurrence -sees neurology within a week for their opinion/further workup -getting new hearing aids next week -reasonable to add MV - they are going to ask neurology about prevagen- I have not personally seen strong data on benefit compared to cost -eliminate sleep aid before bed- doxylamine  # Diabetes- peak a1c of 10.7 in November 2022 off meds S: Medication:Metformin 500 mg extended release- only takes this not  1000 units, Rybelsus 14 mg CBGs- does not check Exercise and diet- encouraged to exercise Lab Results  Component Value Date   HGBA1C 8.0 (A) 01/28/2022   HGBA1C 9.3 (A) 10/22/2021   HGBA1C 10.0 (H) 07/19/2021  A/P: hopefully improved- update a1c today. Continue current meds for now -encouraged to exercise which also helps with brain health  #History of lacunar infarct  #hyperlipidemia #aortic atherosclerosis - LDL goal 70 S: Medication:Pravastatin 40 mg--> rosuvastatin 10 mg Lab Results  Component Value Date   CHOL 238 (H) 07/19/2021   HDL 61.50 07/19/2021   LDLCALC 143 (H) 07/19/2021   LDLDIRECT 93 01/28/2022    TRIG 169.0 (H) 07/19/2021   CHOLHDL 4 07/19/2021  A/P: with history of stroke just changed statin- update next visit- hoping LDL under 70 which is also goal for aortic atherosclerosis   # Depression- on lexapro 5 mg in the past but doing well off meds even with memory loss/history of stroke    05/12/2022    4:09 PM 04/05/2022    3:39 PM 01/28/2022    3:29 PM  Depression screen PHQ 2/9  Decreased Interest 0 0 0  Down, Depressed, Hopeless 0 0 0  PHQ - 2 Score 0 0 0  Altered sleeping 0  0  Tired, decreased energy 0  0  Change in appetite 0  0  Feeling bad or failure about yourself  0  0  Trouble concentrating 0  0  Moving slowly or fidgety/restless 0  0  Suicidal thoughts 0  0  PHQ-9 Score 0  0  Difficult doing work/chores Not difficult at all  Not difficult at all   Recommended follow up: Return in about 14 weeks (around 08/18/2022) for followup or sooner if needed.Schedule b4 you leave. Future Appointments  Date Time Provider [place]  05/16/2022  1:15 PM [facility] NURSE [facility] None  05/16/2022  1:30 PM Rondel Jumbo, PA-C [facility] None  05/30/2022  3:00 PM Heilingoetter, Cassandra L, PA-C [facility] None  04/11/2023  3:15 PM [facility] HEALTH COACH [facility]   Lab/Order associations:   ICD-10-CM   1. Type 2 diabetes mellitus with hyperglycemia, without long-term current use of insulin (HCC)  E11.65 CBC with Differential/Platelet    Comprehensive metabolic panel    HgB 123456    2. Atherosclerosis of aorta (HCC)  I70.0     3. Hyperlipidemia, unspecified hyperlipidemia type  E78.5     4. History of cerebral infarction  Z86.73     5. Memory loss  R41.3      Return precautions advised.  Garret Reddish, MD

## 2022-05-12 NOTE — Patient Instructions (Addendum)
Scheduled for diabetic eye exam- have them send [place] a copy  I will reach out about labs  I will also read report from neurology from next week and follow along  Recommended follow up: Return in about 14 weeks (around 08/18/2022) for followup or sooner if needed.Schedule b4 you leave.

## 2022-05-13 ENCOUNTER — Other Ambulatory Visit: Payer: Self-pay | Admitting: Family Medicine

## 2022-05-13 LAB — CBC WITH DIFFERENTIAL/PLATELET
Basophils Absolute: 0.1 10*3/uL (ref 0.0–0.1)
Basophils Relative: 1.4 % (ref 0.0–3.0)
Eosinophils Absolute: 0.6 10*3/uL (ref 0.0–0.7)
Eosinophils Relative: 6 % — ABNORMAL HIGH (ref 0.0–5.0)
HCT: 39.3 % (ref 36.0–46.0)
Hemoglobin: 13.2 g/dL (ref 12.0–15.0)
Lymphocytes Relative: 22.8 % (ref 12.0–46.0)
Lymphs Abs: 2.2 10*3/uL (ref 0.7–4.0)
MCHC: 33.4 g/dL (ref 30.0–36.0)
MCV: 89.6 fl (ref 78.0–100.0)
Monocytes Absolute: 0.8 10*3/uL (ref 0.1–1.0)
Monocytes Relative: 8.4 % (ref 3.0–12.0)
Neutro Abs: 6 10*3/uL (ref 1.4–7.7)
Neutrophils Relative %: 61.4 % (ref 43.0–77.0)
Platelets: 301 10*3/uL (ref 150.0–400.0)
RBC: 4.39 Mil/uL (ref 3.87–5.11)
RDW: 12.6 % (ref 11.5–15.5)
WBC: 9.7 10*3/uL (ref 4.0–10.5)

## 2022-05-13 LAB — COMPREHENSIVE METABOLIC PANEL
ALT: 21 U/L (ref 0–35)
AST: 21 U/L (ref 0–37)
Albumin: 4.1 g/dL (ref 3.5–5.2)
Alkaline Phosphatase: 77 U/L (ref 39–117)
BUN: 14 mg/dL (ref 6–23)
CO2: 27 mEq/L (ref 19–32)
Calcium: 9.4 mg/dL (ref 8.4–10.5)
Chloride: 100 mEq/L (ref 96–112)
Creatinine, Ser: 1.01 mg/dL (ref 0.40–1.20)
GFR: 51.82 mL/min — ABNORMAL LOW (ref >60.00)
Glucose, Bld: 363 mg/dL — ABNORMAL HIGH (ref 70–99)
Potassium: 4.7 mEq/L (ref 3.5–5.1)
Sodium: 135 mEq/L (ref 135–145)
Total Bilirubin: 0.3 mg/dL (ref 0.2–1.2)
Total Protein: 6.8 g/dL (ref 6.0–8.3)

## 2022-05-13 LAB — HEMOGLOBIN A1C: Hgb A1c MFr Bld: 9.4 % — ABNORMAL HIGH (ref 4.6–6.5)

## 2022-05-16 ENCOUNTER — Ambulatory Visit: Payer: Medicare PPO | Admitting: Physician Assistant

## 2022-05-16 ENCOUNTER — Other Ambulatory Visit (INDEPENDENT_AMBULATORY_CARE_PROVIDER_SITE_OTHER): Payer: Medicare PPO

## 2022-05-16 ENCOUNTER — Ambulatory Visit: Payer: Medicare PPO

## 2022-05-16 ENCOUNTER — Encounter: Payer: Self-pay | Admitting: Physician Assistant

## 2022-05-16 VITALS — BP 152/84 | HR 75 | Resp 18 | Ht 61.0 in | Wt 112.0 lb

## 2022-05-16 DIAGNOSIS — R413 Other amnesia: Secondary | ICD-10-CM

## 2022-05-16 MED ORDER — DONEPEZIL HCL 5 MG PO TABS: 5.0000 mg | ORAL_TABLET | Freq: Every day | ORAL | 11 refills | Status: DC

## 2022-05-16 NOTE — Patient Instructions (Addendum)
It was a pleasure to see you today at our office.   Recommendations:   Check labs today Follow up in 3 month (End of April)  Start Donepezil 5  mg daily.   Whom to call:  Memory  decline, memory medications: Call our office [phone]   For psychiatric meds, mood meds: Please have your primary care physician manage these medications.     For assessment of decision of mental capacity and competency:  Call Dr. Anthoney Harada, geriatric psychiatrist at [phone]  For guidance in geriatric dementia issues please call [facility] [phone]     If you have any severe symptoms of a stroke, or other severe issues such as confusion,severe chills or fever, etc call 911 or go to the ER as you may need to be evaluated further   Feel free to visit Facebook page " [email or web address]" for tips of how to care for people with memory problems.      RECOMMENDATIONS FOR ALL PATIENTS WITH MEMORY PROBLEMS: 1. Continue to exercise (Recommend 30 minutes of walking everyday, or 3 hours every week) 2. Increase social interactions - continue going to [place] and enjoy social gatherings with friends and family 3. Eat healthy, avoid fried foods and eat more fruits and vegetables 4. Maintain adequate blood pressure, blood sugar, and blood cholesterol level. Reducing the risk of stroke and cardiovascular disease also helps promoting better memory. 5. Avoid stressful situations. Live a simple life and avoid aggravations. Organize your time and prepare for the next day in anticipation. 6. Sleep well, avoid any interruptions of sleep and avoid any distractions in the bedroom that may interfere with adequate sleep quality 7. Avoid sugar, avoid sweets as there is a strong link between excessive sugar intake, diabetes, and cognitive impairment We discussed the Mediterranean diet, which has been shown to help patients reduce the risk of progressive memory disorders and reduces cardiovascular risk. This includes  eating fish, eat fruits and green leafy vegetables, nuts like almonds and hazelnuts, walnuts, and also use olive oil. Avoid fast foods and fried foods as much as possible. Avoid sweets and sugar as sugar use has been linked to worsening of memory function.  There is always a concern of gradual progression of memory problems. If this is the case, then we may need to adjust level of care according to patient needs. Support, both to the patient and caregiver, should then be put into place.      You have been referred for a neuropsychological evaluation (i.e., evaluation of memory and thinking abilities). Please bring someone with you to this appointment if possible, as it is helpful for the doctor to hear from both you and another adult who knows you well. Please bring eyeglasses and hearing aids if you wear them.    The evaluation will take approximately 3 hours and has two parts:   The first part is a clinical interview with the neuropsychologist (Dr. Melvyn Novas or Dr. Nicole Kindred). During the interview, the neuropsychologist will speak with you and the individual you brought to the appointment.    The second part of the evaluation is testing with the doctor's technician Hinton Dyer or Maudie Mercury). During the testing, the technician will ask you to remember different types of material, solve problems, and answer some questionnaires. Your family member will not be present for this portion of the evaluation.   Please note: We must reserve several hours of the neuropsychologist's time and the psychometrician's time for your evaluation appointment. As such, there is  a No-Show fee of $100. If you are unable to attend any of your appointments, please contact our office as soon as possible to reschedule.    FALL PRECAUTIONS: Be cautious when walking. Scan the area for obstacles that may increase the risk of trips and falls. When getting up in the mornings, sit up at the edge of the bed for a few minutes before getting out of  bed. Consider elevating the bed at the head end to avoid drop of blood pressure when getting up. Walk always in a well-lit room (use night lights in the walls). Avoid area rugs or power cords from appliances in the middle of the walkways. Use a walker or a cane if necessary and consider physical therapy for balance exercise. Get your eyesight checked regularly.  FINANCIAL OVERSIGHT: Supervision, especially oversight when making financial decisions or transactions is also recommended.  HOME SAFETY: Consider the safety of the kitchen when operating appliances like stoves, microwave oven, and blender. Consider having supervision and share cooking responsibilities until no longer able to participate in those. Accidents with firearms and other hazards in the house should be identified and addressed as well.   ABILITY TO BE LEFT ALONE: If patient is unable to contact 911 operator, consider using LifeLine, or when the need is there, arrange for someone to stay with patients. Smoking is a fire hazard, consider supervision or cessation. Risk of wandering should be assessed by caregiver and if detected at any point, supervision and safe proof recommendations should be instituted.  MEDICATION SUPERVISION: Inability to self-administer medication needs to be constantly addressed. Implement a mechanism to ensure safe administration of the medications.   DRIVING: Regarding driving, in patients with progressive memory problems, driving will be impaired. We advise to have someone else do the driving if trouble finding directions or if minor accidents are reported. Independent driving assessment is available to determine safety of driving.   If you are interested in the driving assessment, you can contact the following:  [employer] in [place]  [place] [phone] or  [phone]    [place] refers to food and lifestyle choices that are based on the traditions of countries located on the [employer]. This way of eating has been shown to help prevent certain conditions and improve outcomes for people who have chronic diseases, like kidney disease and heart disease. What are tips for following this plan? Lifestyle  Cook and eat meals together with your family, when possible. Drink enough fluid to keep your urine clear or pale yellow. Be physically active every day. This includes: Aerobic exercise like running or swimming. Leisure activities like gardening, walking, or housework. Get 7-8 hours of sleep each night. If recommended by your health care provider, drink red wine in moderation. This means 1 glass a day for nonpregnant women and 2 glasses a day for men. A glass of wine equals 5 oz (150 mL). Reading food labels  Check the serving size of packaged foods. For foods such as rice and pasta, the serving size refers to the amount of cooked product, not dry. Check the total fat in packaged foods. Avoid foods that have saturated fat or trans fats. Check the ingredients list for added sugars, such as corn syrup. Shopping  At the grocery store, buy most of your food from the areas near the walls of the store. This includes: Fresh fruits and vegetables (produce). Grains,  beans, nuts, and seeds. Some of these may be available in unpackaged forms or large amounts (in bulk). Fresh seafood. Poultry and eggs. Low-fat dairy products. Buy whole ingredients instead of prepackaged foods. Buy fresh fruits and vegetables in-season from local farmers markets. Buy frozen fruits and vegetables in resealable bags. If you do not have access to quality fresh seafood, buy precooked frozen shrimp or canned fish, such as tuna, salmon, or sardines. Buy small amounts of raw or cooked vegetables, salads, or olives from the deli or salad bar  at your store. Stock your pantry so you always have certain foods on hand, such as olive oil, canned tuna, canned tomatoes, rice, pasta, and beans. Cooking  Cook foods with extra-virgin olive oil instead of using butter or other vegetable oils. Have meat as a side dish, and have vegetables or grains as your main dish. This means having meat in small portions or adding small amounts of meat to foods like pasta or stew. Use beans or vegetables instead of meat in common dishes like chili or lasagna. Experiment with different cooking methods. Try roasting or broiling vegetables instead of steaming or sauteing them. Add frozen vegetables to soups, stews, pasta, or rice. Add nuts or seeds for added healthy fat at each meal. You can add these to yogurt, salads, or vegetable dishes. Marinate fish or vegetables using olive oil, lemon juice, garlic, and fresh herbs. Meal planning  Plan to eat 1 vegetarian meal one day each week. Try to work up to 2 vegetarian meals, if possible. Eat seafood 2 or more times a week. Have healthy snacks readily available, such as: Vegetable sticks with hummus. Greek yogurt. Fruit and nut trail mix. Eat balanced meals throughout the week. This includes: Fruit: 2-3 servings a day Vegetables: 4-5 servings a day Low-fat dairy: 2 servings a day Fish, poultry, or lean meat: 1 serving a day Beans and legumes: 2 or more servings a week Nuts and seeds: 1-2 servings a day Whole grains: 6-8 servings a day Extra-virgin olive oil: 3-4 servings a day Limit red meat and sweets to only a few servings a month What are my food choices? Mediterranean diet Recommended Grains: Whole-grain pasta. Brown rice. Bulgar wheat. Polenta. Couscous. Whole-wheat bread. [place]. Vegetables: Artichokes. Beets. Broccoli. Cabbage. Carrots. Eggplant. Green beans. Chard. Kale. Spinach. Onions. Leeks. Peas. Squash. Tomatoes. Peppers. Radishes. Fruits: Apples. Apricots. Avocado. Berries.  Bananas. Cherries. Dates. Figs. Grapes. Lemons. Melon. Oranges. Peaches. Plums. Pomegranate. Meats and other protein foods: Beans. Almonds. Sunflower seeds. Pine nuts. Peanuts. [place]. Salmon. Scallops. Shrimp. [place]. Tilapia. Clams. Oysters. Eggs. Dairy: Low-fat milk. Cheese. Greek yogurt. Beverages: Water. Red wine. Herbal tea. Fats and oils: Extra virgin olive oil. Avocado oil. Grape seed oil. Sweets and desserts: [place] yogurt with honey. Baked apples. Poached pears. Trail mix. Seasoning and other foods: Basil. Cilantro. Coriander. Cumin. Mint. Parsley. Sage. Rosemary. Tarragon. Garlic. Oregano. Thyme. Pepper. Balsalmic vinegar. Tahini. Hummus. Tomato sauce. Olives. Mushrooms. Limit these Grains: Prepackaged pasta or rice dishes. Prepackaged cereal with added sugar. Vegetables: Deep fried potatoes (french fries). Fruits: Fruit canned in syrup. Meats and other protein foods: Beef. Pork. Lamb. Poultry with skin. Hot dogs. [place] Salines. Dairy: Ice cream. Sour cream. Whole milk. Beverages: Juice. Sugar-sweetened soft drinks. Beer. Liquor and spirits. Fats and oils: Butter. Canola oil. Vegetable oil. Beef fat (tallow). Lard. Sweets and desserts: Cookies. Cakes. Pies. Candy. Seasoning and other foods: Mayonnaise. Premade sauces and marinades. The items listed may not be a complete list. Talk with your dietitian about  what dietary choices are right for you. Summary The Mediterranean diet includes both food and lifestyle choices. Eat a variety of fresh fruits and vegetables, beans, nuts, seeds, and whole grains. Limit the amount of red meat and sweets that you eat. Talk with your health care provider about whether it is safe for you to drink red wine in moderation. This means 1 glass a day for nonpregnant women and 2 glasses a day for men. A glass of wine equals 5 oz (150 mL). This information is not intended to replace advice given to you by your health care provider. Make sure you discuss any questions you  have with your health care provider. Document Released: 11/05/2015 Document Revised: 12/08/2015 Document Reviewed: 11/05/2015 Elsevier Interactive Patient Education  2017 [place] [address]

## 2022-05-16 NOTE — Progress Notes (Signed)
Assessment/Plan:    The patient is seen in neurologic consultation at the request of Marin Olp, MD for the evaluation of memory.  Gabrielle Robinson is a very pleasant 82 y.o. year old [place] female with  a history of hypertension, hyperlipidemia, DM2, history of depression, vitamin D deficiency, history of melanoma, B12 deficiency, chronic shoulder pain, history of CVA perImaging, seen today for evaluation of memory loss. MoCA today is 18/30 . Workup is in progress    Memory Impairment  MRI brain without contrast to assess for underlying structural abnormality and assess vascular load  Folllow up  in 3 months  Check B1 Start donepezil 5 mg daily side effects discussed Monitor driving  Recommend good control of cardiovascular risk factors.     Subjective:    The patient is accompanied by her husband  who supplements the history.    How long did patient have memory difficulties? She denies any memory issues. Husband noticed some slight memory issues 2 months ago, confusion about dates and times and some difficulty remembering recent conversations and people names  repeats oneself? Denies  Disoriented when walking into a room?  Patient denies Leaving objects in unusual places?  Endorsed, but not in unusual places   Wandering behavior? denies   Any personality changes since last visit? "She gets discouraged when solving an issue, but nothing radical" Any worsening depression?:  denies   Hallucinations or paranoia?  denies   Seizures?   denies    Any sleep changes?  Denies  vivid dreams, REM behavior or sleepwalking   Sleep apnea? denies   Any hygiene concerns?  denies   Independent of bathing and dressing?  Endorsed  Does the patient need help with medications? Husband is in charge   Who is in charge of the finances? Husband  is in charge     Any changes in appetite?  Both eat 2 meals, "not too hungry"    Patient have trouble swallowing?  Denies   Does the patient cook? No  Any  kitchen accidents such as leaving the stove on? Patient denies   Any headaches?  Denies   Chronic back pain  Denies  "I never had pain" Ambulates with difficulty?   Denies   Recent falls or head injuries?  denies     Unilateral weakness, numbness or tingling?  denies   Any tremors?  denies   Any anosmia?  denies   Any incontinence of urine?  denies   Any bowel dysfunction?  Intermittent diarrhea.  Patient lives  with her husband  History of heavy alcohol intake?glass or two of wine at night, "maybe a little more but not overindulgent" History of heavy tobacco use?   denies   Family history of dementia?  Denies  Dose patient drive? She had one episode where she" missed a road or two"  Retired Pharmacist, hospital. 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  January 2024 TSH 2.89, B12 1057  No Known Allergies  Current Outpatient Medications  Medication Instructions   aspirin EC 81 mg, Oral, Daily, Swallow whole.   Cyanocobalamin (VITAMIN B 12 PO) Oral   donepezil (ARICEPT) 5 mg, Oral, Daily   metFORMIN (GLUCOPHAGE XR)  1,000 mg, Oral, Daily with breakfast   Multiple Vitamin (MULTIVITAMIN) capsule 1 capsule, Oral, Daily   Polyethyl Glycol-Propyl Glycol (SYSTANE ULTRA OP) 1 drop, Both Eyes, Daily PRN   rosuvastatin (CRESTOR) 10 mg, Oral, Daily   Rybelsus 14 mg, Oral, Daily     VITALS:   Vitals:   05/16/22 1311  BP: (!) 152/84  Pulse: 75  Resp: 18  SpO2: 98%  Weight: 112 lb (50.8 kg)  Height: 5' 1"$  (1.549 m)    PHYSICAL EXAM   HEENT:  Normocephalic, atraumatic. The mucous membranes are moist. The superficial temporal arteries are without ropiness or tenderness. Cardiovascular:  Regular rate and rhythm. Lungs: Clear to auscultation bilaterally. Neck: There are no carotid bruits noted bilaterally.  NEUROLOGICAL:    05/16/2022    7:00 PM  Montreal Cognitive Assessment   Visuospatial/ Executive (0/5) 1  Naming (0/3) 3  Attention: Read list of digits (0/2) 2  Attention: Read list of letters (0/1) 1  Attention: Serial 7 subtraction starting at 100 (0/3) 1  Language: Repeat phrase (0/2) 1  Language : Fluency (0/1) 1  Abstraction (0/2) 1  Delayed Recall (0/5) 1  Orientation (0/6) 6  Total 18  Adjusted Score (based on education) 18       10/25/2021    8:42 AM  MMSE - Mini Mental State Exam  Orientation to time 5  Orientation to Place 5  Registration 3  Attention/ Calculation 5  Recall 2  Language- name 2 objects 2  Language- repeat 1  Language- follow 3 step command 3  Language- read & follow direction 1  Write a sentence 1  Copy design 1  Total score 29     Orientation:  Alert and oriented to person, place and time. No aphasia or dysarthria. Fund of knowledge is appropriate. Recent memory impaired and remote memory intact.  Attention and concentration are normal.  Able to name objects and repeat phrases. Delayed recall  1/5 Cranial nerves: There is good facial symmetry. Extraocular muscles are intact and visual fields are full to confrontational testing. Speech is not fluent but clear. no tongue deviation. Hearing is intact to conversational tone. Tone: Tone is good throughout. Sensation: Sensation is intact to light touch and pinprick throughout. Vibration is intact at the bilateral big toe.There is no extinction with double simultaneous stimulation. There is no sensory dermatomal level identified. Coordination: The patient has no difficulty with RAM's or FNF bilaterally. Normal finger to nose  Motor: Strength is 5/5 in the bilateral upper and lower extremities. There is no pronator drift. There are no fasciculations noted. DTR's: Deep tendon reflexes  are 2/4 at the bilateral biceps, triceps, brachioradialis, patella and achilles.  Plantar responses are downgoing bilaterally. Gait and Station: The patient is able to ambulate without difficulty.The patient is able to ambulate in a tandem fashion, able to stand in the Romberg position.     Thank you for allowing [place] the opportunity to participate in the care of this nice patient. Please do not hesitate to contact us for any questions or concerns.   Total time spent on today's visit was 56 minutes dedicated to this patient today, preparing to see patient, examining the patient, ordering tests and/or medications and counseling the patient, documenting clinical information in the EHR or other health record, independently interpreting results and communicating results to the patient/family, discussing treatment and goals, answering patient's questions and coordinating care.  Cc:  Marin Olp, MD  Sharene Butters 05/16/2022 7:57 PM

## 2022-05-18 ENCOUNTER — Other Ambulatory Visit: Payer: Self-pay

## 2022-05-19 LAB — VITAMIN B1: Vitamin B1 (Thiamine): 31 nmol/L — ABNORMAL HIGH (ref 8–30)

## 2022-05-19 NOTE — Progress Notes (Signed)
B1 is normal thanks

## 2022-05-20 ENCOUNTER — Telehealth: Payer: Self-pay | Admitting: Physician Assistant

## 2022-05-20 NOTE — Telephone Encounter (Signed)
Pt's husband called in stating the pt took her Donepezil this morning and threw it up. They are not sure if this is normal and if they are to still try to take it tomorrow?

## 2022-05-23 NOTE — Telephone Encounter (Signed)
Called and informed pt of Sharene Butters PA-C answer. She understood,

## 2022-05-26 NOTE — Progress Notes (Signed)
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  She was found to have T2N1 disease.   PRIOR THERAPY: post wide excision and sentinel lymph node sampling completed and February 08, 2018.   CURRENT THERAPY: Active surveillance   INTERVAL HISTORY: Gabrielle Robinson 82 y.o. female returns to the clinic today for a 1 year follow-up visit accompanied by her husband. She last saw Dr. Alen Blew in February 2023.  The patient has a history of stage III melanoma and is currently on observation.  She states she is overdue for her routine skin checks with dermatology. She sees Dr. Dian Situ. She follows with precautions such as staying out of the sun. She is currently been undergoing workup for memory loss.  She actually recently had a brain MRI performed by neurology which was negative for any metastatic disease.  She denies any new or concerning skin lesions.  Denies any headache or visual changes.  Denies any shortness of breath, cough, or hemoptysis.  Denies any abdominal pain or fullness.  Denies any nausea, vomiting, diarrhea, or constipation. She had some GI upset with Aricept and it is currently on hold.  Denies any new or unusual bone pain.  She is here today for evaluation and repeat blood work.   MEDICAL HISTORY: Past Medical History:  Diagnosis Date   Cancer [facility])    Left Hip Melanoma   Diabetes mellitus without complication ([place])    Hyperlipemia 01/05/2009   Qualifier: Diagnosis of  By: Arnoldo Morale MD, Balinda Quails    Major depressive disorder with single episode, in full remission ([place]) 05/28/2015    ALLERGIES:  has No Known Allergies.  MEDICATIONS:  Current Outpatient Medications  Medication Sig Dispense Refill   aspirin EC 81 MG tablet Take 81 mg by mouth daily. Swallow whole.     Cyanocobalamin (VITAMIN B 12 PO) Take by mouth.     donepezil (ARICEPT) 5 MG tablet  Take 1 tablet (5 mg total) by mouth daily. 30 tablet 11   metFORMIN (GLUCOPHAGE XR) 500 MG 24 hr tablet Take 2 tablets (1,000 mg total) by mouth daily with breakfast. 180 tablet 3   Multiple Vitamin (MULTIVITAMIN) capsule Take 1 capsule by mouth daily.     Polyethyl Glycol-Propyl Glycol (SYSTANE ULTRA OP) Place 1 drop into both eyes daily as needed (dry eyes).     rosuvastatin (CRESTOR) 10 MG tablet Take 1 tablet (10 mg total) by mouth daily. 90 tablet 3   Semaglutide (RYBELSUS) 14 MG TABS Take 1 tablet (14 mg total) by mouth daily. 30 tablet 5   No current facility-administered medications for this visit.    SURGICAL HISTORY:  Past Surgical History:  Procedure Laterality Date   MELANOMA EXCISION Left 02/08/2018   Procedure: WIDE LOCAL EXCISION WITH ADVANCEMENT FLAP CLOSURE OF LEFT HIP MELANOMA ERAS PATHWAY;  Surgeon: [name], MD;  Location: [place];  Service: General;  Laterality: Left;   SENTINEL NODE BIOPSY Left 02/08/2018   Procedure: LEFT INGUINAL SENTINEL LYMPH NODE BIOPSY;  Surgeon: [name], MD;  Location: [place];  Service: General;  Laterality: Left;    REVIEW OF SYSTEMS:   Review of Systems  Constitutional: Negative for appetite change, chills, fatigue, fever and unexpected weight change.  HENT: Negative for mouth sores, nosebleeds, sore throat and trouble swallowing.   Eyes: Negative for eye problems and icterus.  Respiratory: Negative for  cough, hemoptysis, shortness of breath and wheezing.   Cardiovascular: Negative for chest pain and leg swelling.  Gastrointestinal: Negative for abdominal pain, constipation, diarrhea, nausea and vomiting.  Genitourinary: Negative for bladder incontinence, difficulty urinating, dysuria, frequency and hematuria.   Musculoskeletal: Negative for back pain, gait problem, neck pain and neck stiffness.  Skin: Negative for itching and rash.  Neurological: Negative for dizziness, extremity weakness, gait problem, headaches, light-headedness  and seizures.  Hematological: Negative for adenopathy. Does not bruise/bleed easily.  Psychiatric/Behavioral: Negative for confusion, depression and sleep disturbance. The patient is not nervous/anxious.     PHYSICAL EXAMINATION:  Blood pressure 139/80, pulse (!) 111, temperature (!) 97.5 F (36.4 C), temperature source Oral, resp. rate 16, weight 106 lb 4.8 oz (48.2 kg), SpO2 97 %.  ECOG PERFORMANCE STATUS: 1  Physical Exam  Constitutional: Oriented to person, place, and time and thin appearing female and in no distress.  HENT:  Head: Normocephalic and atraumatic.  Mouth/Throat: Oropharynx is clear and moist. No oropharyngeal exudate.  Eyes: Conjunctivae are normal. Right eye exhibits no discharge. Left eye exhibits no discharge. No scleral icterus.  Neck: Normal range of motion. Neck supple.  Cardiovascular: Normal rate, regular rhythm, normal heart sounds and intact distal pulses.   Pulmonary/Chest: Effort normal and breath sounds normal. No respiratory distress. No wheezes. No rales.  Abdominal: Soft. Bowel sounds are normal. Exhibits no distension and no mass. There is no tenderness.  Musculoskeletal: Normal range of motion. Exhibits no edema.  Lymphadenopathy:    No cervical adenopathy.  Neurological: Alert and oriented to person, place, and time. Exhibits normal muscle tone. Gait normal. Coordination normal.  Skin: Skin is warm and dry. No rash noted. Not diaphoretic. No erythema. No pallor.  Psychiatric: Mood, memory and judgment normal.  Vitals reviewed.  LABORATORY DATA: Lab Results  Component Value Date   WBC 9.7 05/12/2022   HGB 13.2 05/12/2022   HCT 39.3 05/12/2022   MCV 89.6 05/12/2022   PLT 301.0 05/12/2022      Chemistry      Component Value Date/Time   NA 135 05/12/2022 1639   K 4.7 05/12/2022 1639   CL 100 05/12/2022 1639   CO2 27 05/12/2022 1639   BUN 14 05/12/2022 1639   CREATININE 1.01 05/12/2022 1639   CREATININE 0.90 01/28/2022 1631       Component Value Date/Time   CALCIUM 9.4 05/12/2022 1639   ALKPHOS 77 05/12/2022 1639   AST 21 05/12/2022 1639   AST 15 04/10/2020 1028   ALT 21 05/12/2022 1639   ALT 16 04/10/2020 1028   BILITOT 0.3 05/12/2022 1639   BILITOT 0.7 04/10/2020 1028       RADIOGRAPHIC STUDIES:  No results found.   ASSESSMENT/PLAN:  This is a very pleasant 82 year old Caucasian female diagnosed in 2019 with a stage III (T2a, N1, M0) melanoma.   She underwent excision and is been on observation since that time and is feeling well.   The patient was seen with Dr. Julien Nordmann today.  Labs were reviewed.  Recommend that she continue on observation.  Will arrange for restaging CT scan of the chest, abdomen, pelvis to be performed in the next week or so.  As long as this does not show any evidence of disease progression, we will see her back for follow-up visit in 1 year.   Will call the patient with the results of her scan.  I will double check with Dr. Julien Nordmann if I will need to order repeat CT  scan chest, abdomen, and pelvis for her follow-up in 1 year.  In the meantime, I did encourage her to always make sure she is up-to-date on her routine skin checks with her dermatologist.  She should have this performed at a minimum of annually.  No follow-up?  The patient was advised to call immediately if she has any concerning symptoms in the interval. The patient voices understanding of current disease status and treatment options and is in agreement with the current care plan. All questions were answered. The patient knows to call the clinic with any problems, questions or concerns. We can certainly see the patient much sooner if necessary   Orders Placed This Encounter  Procedures   CT Chest W Contrast    Standing Status:   Future    Standing Expiration Date:   05/30/2023    Order Specific Question:   If indicated for the ordered procedure, I authorize the administration of contrast media per Radiology protocol     Answer:   Yes    Order Specific Question:   Does the patient have a contrast media/X-ray dye allergy?    Answer:   No    Order Specific Question:   Preferred imaging location?    Answer:   [facility]   CT Abdomen Pelvis W Contrast    Standing Status:   Future    Standing Expiration Date:   05/30/2023    Order Specific Question:   If indicated for the ordered procedure, I authorize the administration of contrast media per Radiology protocol    Answer:   Yes    Order Specific Question:   Does the patient have a contrast media/X-ray dye allergy?    Answer:   No    Order Specific Question:   Preferred imaging location?    Answer:   [facility]    Order Specific Question:   Is Oral Contrast requested for this exam?    Answer:   Yes, Per Radiology protocol   CBC with Differential ([place] Only)    Standing Status:   Future    Standing Expiration Date:   05/30/2023   CMP ([place] only)    Standing Status:   Future    Standing Expiration Date:   05/30/2023      Tobe Sos Kayler Rise, PA-C 05/30/22  ADDENDUM: Hematology/Oncology Attending: I had a face-to-face encounter with the patient today.  I reviewed her records, lab and recommended her care plan.  This is a very pleasant 82 years old white female who came to the clinic today to establish care with me after her primary oncologist Dr. Alen Blew left the practice.  The patient has a history of cutaneous melanoma of the left side diagnosed in November 2019 presented initially as T2 N1 disease status post wide excision and sentinel lymph node biopsy completed in November 2019.  She has been on observation since that time.  The patient had a last PET scan in February 2023 and it was unremarkable for any disease recurrence.  She is here today for evaluation and repeat blood work and recommendation regarding her condition. I had a lengthy discussion with the patient today about her condition and I recommended for her  to have repeat CT scan of the chest, abdomen and pelvis for restaging of her disease.  If the patient has no evidence for disease recurrence on the imaging studies, we will see her back for follow-up visit in 1 year for evaluation and repeat imaging  studies. The patient was advised to call immediately if she has any other concerning symptoms in the interval. The total time spent in the appointment was 30 minutes. Disclaimer: This note was dictated with voice recognition software. Similar sounding words can inadvertently be transcribed and may be missed upon review. Eilleen Kempf, MD

## 2022-05-30 ENCOUNTER — Other Ambulatory Visit: Payer: Self-pay

## 2022-05-30 ENCOUNTER — Inpatient Hospital Stay: Payer: Medicare PPO | Attending: Physician Assistant | Admitting: Physician Assistant

## 2022-05-30 VITALS — BP 139/80 | HR 111 | Temp 97.5°F | Resp 16 | Wt 106.3 lb

## 2022-05-30 DIAGNOSIS — C4372 Malignant melanoma of left lower limb, including hip: Secondary | ICD-10-CM | POA: Diagnosis not present

## 2022-05-30 DIAGNOSIS — Z08 Encounter for follow-up examination after completed treatment for malignant neoplasm: Secondary | ICD-10-CM

## 2022-05-30 DIAGNOSIS — Z85820 Personal history of malignant melanoma of skin: Secondary | ICD-10-CM | POA: Diagnosis not present

## 2022-05-31 ENCOUNTER — Ambulatory Visit: Payer: Medicare PPO | Admitting: Family Medicine

## 2022-06-02 ENCOUNTER — Telehealth: Payer: Medicare PPO | Admitting: Family Medicine

## 2022-06-02 ENCOUNTER — Encounter: Payer: Self-pay | Admitting: Family Medicine

## 2022-06-02 DIAGNOSIS — H2513 Age-related nuclear cataract, bilateral: Secondary | ICD-10-CM | POA: Diagnosis not present

## 2022-06-02 DIAGNOSIS — E119 Type 2 diabetes mellitus without complications: Secondary | ICD-10-CM | POA: Diagnosis not present

## 2022-06-02 DIAGNOSIS — H5203 Hypermetropia, bilateral: Secondary | ICD-10-CM | POA: Diagnosis not present

## 2022-06-02 LAB — HM DIABETES EYE EXAM

## 2022-06-03 NOTE — Progress Notes (Signed)
Patient canceled visit same day.

## 2022-06-08 ENCOUNTER — Encounter: Payer: Self-pay | Admitting: Family Medicine

## 2022-06-08 ENCOUNTER — Telehealth (INDEPENDENT_AMBULATORY_CARE_PROVIDER_SITE_OTHER): Payer: Medicare PPO | Admitting: Family Medicine

## 2022-06-08 VITALS — Ht 61.0 in | Wt 109.0 lb

## 2022-06-08 DIAGNOSIS — E785 Hyperlipidemia, unspecified: Secondary | ICD-10-CM

## 2022-06-08 DIAGNOSIS — I700 Atherosclerosis of aorta: Secondary | ICD-10-CM

## 2022-06-08 DIAGNOSIS — R413 Other amnesia: Secondary | ICD-10-CM

## 2022-06-08 DIAGNOSIS — E1165 Type 2 diabetes mellitus with hyperglycemia: Secondary | ICD-10-CM | POA: Diagnosis not present

## 2022-06-08 MED ORDER — LANCET DEVICE MISC: 1.0000 | Freq: Three times a day (TID) | 0 refills | Status: AC

## 2022-06-08 MED ORDER — BLOOD GLUCOSE TEST VI STRP: 1.0000 | ORAL_STRIP | Freq: Three times a day (TID) | 0 refills | Status: AC

## 2022-06-08 MED ORDER — BLOOD GLUCOSE MONITORING SUPPL DEVI: 1.0000 | Freq: Three times a day (TID) | 0 refills | Status: DC

## 2022-06-08 MED ORDER — LANCETS MISC. MISC: 1.0000 | Freq: Three times a day (TID) | 0 refills | Status: AC

## 2022-06-08 NOTE — Progress Notes (Signed)
Phone [phone] Virtual visit via Video note   Subjective:  Chief complaint: Chief Complaint  Patient presents with   medications    Pt husband states he wants to review and discuss each medication, does not have any concerns about depression.    Our team/I connected with Gabrielle Robinson at 11:20 AM EDT by a video enabled telemedicine application ([email or web address] or caregility through epic) and verified that I am speaking with the correct person using two identifiers. No physical exam was performed (except for noted visual exam or audio findings with Telehealth visits).   Location patient: [place] Location provider: [facility], office Persons participating in the virtual visit:  patient, husband  The patient expressed consent for telemedicine visit and agreed to proceed. Patient understands insurance will be billed.   Past Medical History-  Patient Active Problem List   Diagnosis Date Noted   History of cerebral infarction 05/12/2022    Priority: High   Memory loss 05/12/2022    Priority: High   History of melanoma 07/27/2021    Priority: High   Diabetes mellitus ([place]) 04/01/2013    Priority: High   Vitamin D deficiency 10/22/2021    Priority: Medium    Atherosclerosis of aorta ([place]) 07/27/2021    Priority: Medium    Major depressive disorder with single episode, in full remission ([place]) 05/28/2015    Priority: Medium    Hyperlipemia 01/05/2009    Priority: Medium    Bilateral shoulder pain 05/28/2015    Priority: Low    Medications- reviewed and updated Current Outpatient Medications  Medication Sig Dispense Refill   aspirin EC 81 MG tablet Take 81 mg by mouth daily. Swallow whole.     Blood Glucose Monitoring Suppl DEVI 1 each by Does not apply route in the morning, at noon, and at bedtime. May substitute to any manufacturer covered by patient's insurance. 1 each 0   Cyanocobalamin (VITAMIN B 12 PO) Take by mouth.     Glucose Blood (BLOOD GLUCOSE TEST STRIPS) STRP 1 each by  In Vitro route in the morning, at noon, and at bedtime. May substitute to any manufacturer covered by patient's insurance. 100 strip 0   Lancet Device MISC 1 each by Does not apply route in the morning, at noon, and at bedtime. May substitute to any manufacturer covered by patient's insurance. 1 each 0   Lancets Misc. MISC 1 each by Does not apply route in the morning, at noon, and at bedtime. May substitute to any manufacturer covered by patient's insurance. 100 each 0   metFORMIN (GLUCOPHAGE XR) 500 MG 24 hr tablet Take 2 tablets (1,000 mg total) by mouth daily with breakfast. 180 tablet 3   Multiple Vitamin (MULTIVITAMIN) capsule Take 1 capsule by mouth daily.     Polyethyl Glycol-Propyl Glycol (SYSTANE ULTRA OP) Place 1 drop into both eyes daily as needed (dry eyes).     rosuvastatin (CRESTOR) 10 MG tablet Take 1 tablet (10 mg total) by mouth daily. 90 tablet 3   Semaglutide (RYBELSUS) 14 MG TABS Take 1 tablet (14 mg total) by mouth daily. 30 tablet 5   No current facility-administered medications for this visit.     Objective:  Ht '5\' 1"'$  (1.549 m)   Wt 109 lb (49.4 kg)   BMI 20.60 kg/m  self reported vitals Gen: NAD, resting comfortably Lungs: nonlabored, normal respiratory rate  Skin: appears dry, no obvious rash     Assessment and Plan   # activity/appetite change S:after our visit  05/12/22 we increased metformin- she has had less appetite- at 108 on home scales- was 111 here last visit. Also sleeping more.  - trial donepezil 5 mg daily started 05/16/22- threw up everyday for a week and stopped A/P: We discussed some of this could be related to metformin-some could also be related to lingering from donepezil-tolerable enough that she wants to monitor for now  # Memory concerns S:Medication: No medication  -Donepezil 5 mg daily started 05/16/22 -January 2024 MRI-noted prior lacunar infarct-now on aspirin A/P: Memory deficits-suspect mild cognitive impairment-continue follow-up  with neurology.  # Diabetes- peak a1c of 10.7 in November 2022 off meds S: Medication:Metformin 1000 mg extended release (only takes '500mg'$  on 05/12/22- afterwards increase to '1000mg'$ ), Rybelsus 14 mg CBGs- not checking Exercise and diet- trying to cut down on sugar intake. Started with some walking yesterday Lab Results  Component Value Date   HGBA1C 9.4 (H) 05/12/2022   HGBA1C 8.0 (A) 01/28/2022   HGBA1C 9.3 (A) 10/22/2021  A/P: Poor control but hopefully improving with increased metformin to 1000 mg extended release with continuation of Rybelsus-likely not enough to get A1c down so also focus on healthy eating and try to push exercise-they are not interested for now on adding medication  #History of lacunar infarct-on aspirin 81 mg #hyperlipidemia #aortic atherosclerosis - LDL goal 70 S: Medication:Pravastatin 40 mg--> rosuvastatin 10 mg Lab Results  Component Value Date   CHOL 238 (H) 07/19/2021   HDL 61.50 07/19/2021   LDLCALC 143 (H) 07/19/2021   LDLDIRECT 93 01/28/2022   TRIG 169.0 (H) 07/19/2021   CHOLHDL 4 07/19/2021  A/P: Cholesterol has been above ideal goal-we are hoping for LDL under 70 when she follows up-can check full lipid panel at that time in person.  Continue aspirin 81 mg Aortic atherosclerosis (presumed stable)- LDL goal ideally <70- update lipids at follow up    # anxiety/Depression S: Medication:Lexapro  5 mg in the past- came off around march 2023 and doing well-she has been discouraged about recent information about her memory and is well as GI upset concerns    06/08/2022   11:20 AM 05/12/2022    4:09 PM 04/05/2022    3:39 PM  Depression screen PHQ 2/9  Decreased Interest 0 0 0  Down, Depressed, Hopeless 2 0 0  PHQ - 2 Score 2 0 0  Altered sleeping 0 0   Tired, decreased energy 1 0   Change in appetite 3 0   Feeling bad or failure about yourself  0 0   Trouble concentrating 0 0   Moving slowly or fidgety/restless 0 0   Suicidal thoughts 0 0   PHQ-9  Score 6 0   Difficult doing work/chores Somewhat difficult Not difficult at all   A/P: Very mild poor control-does not want to take medication at this time-continue to monitor  Recommended follow up: Return for next already scheduled visit or sooner if needed.  Future Appointments  Date Time Provider [place]  06/13/2022  1:30 PM [facility] 1 [facility] [place]  08/17/2022  2:00 PM Rondel Jumbo, PA-C [facility] None  08/18/2022  1:20 PM Marin Olp, MD [facility]  04/11/2023  3:15 PM [facility] HEALTH COACH [facility]  05/30/2023  3:15 PM [facility] None  05/30/2023  3:45 PM Curt Bears, MD [facility] None    Lab/Order associations: No diagnosis found.  Meds ordered this encounter  Medications   Blood Glucose Monitoring Suppl DEVI    Sig: 1 each  by Does not apply route in the morning, at noon, and at bedtime. May substitute to any manufacturer covered by patient's insurance.    Dispense:  1 each    Refill:  0   Glucose Blood (BLOOD GLUCOSE TEST STRIPS) STRP    Sig: 1 each by In Vitro route in the morning, at noon, and at bedtime. May substitute to any manufacturer covered by patient's insurance.    Dispense:  100 strip    Refill:  0   Lancet Device MISC    Sig: 1 each by Does not apply route in the morning, at noon, and at bedtime. May substitute to any manufacturer covered by patient's insurance.    Dispense:  1 each    Refill:  0   Lancets Misc. MISC    Sig: 1 each by Does not apply route in the morning, at noon, and at bedtime. May substitute to any manufacturer covered by patient's insurance.    Dispense:  100 each    Refill:  0   Return precautions advised.  Garret Reddish, MD

## 2022-06-08 NOTE — Patient Instructions (Addendum)
No changes today other than continuing to limit sugar intake and recheck an A1c at follow-up  Recommended follow up: Return for next already scheduled visit or sooner if needed.

## 2022-06-13 ENCOUNTER — Ambulatory Visit (HOSPITAL_COMMUNITY)
Admission: RE | Admit: 2022-06-13 | Discharge: 2022-06-13 | Disposition: A | Discharge status: Home / Self Care | Payer: Medicare PPO | Source: Ambulatory Visit | Attending: Physician Assistant | Admitting: Physician Assistant

## 2022-06-13 DIAGNOSIS — C439 Malignant melanoma of skin, unspecified: Secondary | ICD-10-CM | POA: Diagnosis not present

## 2022-06-13 DIAGNOSIS — Z08 Encounter for follow-up examination after completed treatment for malignant neoplasm: Secondary | ICD-10-CM | POA: Diagnosis not present

## 2022-06-13 DIAGNOSIS — Z85820 Personal history of malignant melanoma of skin: Secondary | ICD-10-CM | POA: Insufficient documentation

## 2022-06-13 DIAGNOSIS — J9811 Atelectasis: Secondary | ICD-10-CM | POA: Diagnosis not present

## 2022-06-13 DIAGNOSIS — K760 Fatty (change of) liver, not elsewhere classified: Secondary | ICD-10-CM | POA: Diagnosis not present

## 2022-06-13 MED ORDER — IOHEXOL 9 MG/ML PO SOLN
500.0000 mL | ORAL | Status: AC
  Administered 2022-06-13: 500 mL via ORAL

## 2022-06-13 MED ORDER — IOHEXOL 300 MG/ML  SOLN
75.0000 mL | Freq: Once | INTRAMUSCULAR | Status: AC | PRN
  Administered 2022-06-13: 75 mL via INTRAVENOUS

## 2022-06-13 MED ORDER — SODIUM CHLORIDE (PF) 0.9 % IJ SOLN
INTRAMUSCULAR | Status: AC
  Filled 2022-06-13: qty 50

## 2022-06-13 MED ORDER — IOHEXOL 9 MG/ML PO SOLN
ORAL | Status: AC
  Filled 2022-06-13: qty 1000

## 2022-06-15 ENCOUNTER — Other Ambulatory Visit: Payer: Self-pay | Admitting: Physician Assistant

## 2022-06-15 ENCOUNTER — Telehealth: Payer: Self-pay | Admitting: Physician Assistant

## 2022-06-15 DIAGNOSIS — Z85820 Personal history of malignant melanoma of skin: Secondary | ICD-10-CM

## 2022-06-15 NOTE — Telephone Encounter (Signed)
I called the patient to let her know that her CT scan did not show any metastatic diease. We will see her in March 2025 as scheduled. I have ordered a repeat CT scan to be performed a few days before her visit. She expressed understanding and was complementary of the staff in the CT department.

## 2022-06-29 DIAGNOSIS — H269 Unspecified cataract: Secondary | ICD-10-CM | POA: Diagnosis not present

## 2022-06-29 DIAGNOSIS — H2511 Age-related nuclear cataract, right eye: Secondary | ICD-10-CM | POA: Diagnosis not present

## 2022-07-15 ENCOUNTER — Telehealth: Payer: Self-pay | Admitting: Physician Assistant

## 2022-07-15 NOTE — Telephone Encounter (Signed)
Pt's husband called in stating he has seen a decline in the pt's memory since she was last here. She recently got lost in [employer]. He is wanting to see if there is anything that can be done to keep her from declining until they come in on 08/17/22?

## 2022-07-15 NOTE — Telephone Encounter (Signed)
Called Pt. And informed husband of results per Alben Spittle

## 2022-07-20 DIAGNOSIS — H269 Unspecified cataract: Secondary | ICD-10-CM | POA: Diagnosis not present

## 2022-07-20 DIAGNOSIS — H2512 Age-related nuclear cataract, left eye: Secondary | ICD-10-CM | POA: Diagnosis not present

## 2022-08-17 ENCOUNTER — Ambulatory Visit: Payer: Medicare PPO | Admitting: Physician Assistant

## 2022-08-18 ENCOUNTER — Encounter: Payer: Self-pay | Admitting: Family Medicine

## 2022-08-18 ENCOUNTER — Ambulatory Visit: Payer: Medicare PPO | Admitting: Family Medicine

## 2022-08-18 VITALS — BP 122/70 | HR 92 | Temp 97.7°F | Ht 61.0 in | Wt 95.6 lb

## 2022-08-18 DIAGNOSIS — Z85828 Personal history of other malignant neoplasm of skin: Secondary | ICD-10-CM | POA: Diagnosis not present

## 2022-08-18 DIAGNOSIS — D692 Other nonthrombocytopenic purpura: Secondary | ICD-10-CM | POA: Diagnosis not present

## 2022-08-18 DIAGNOSIS — R413 Other amnesia: Secondary | ICD-10-CM | POA: Diagnosis not present

## 2022-08-18 DIAGNOSIS — Z85820 Personal history of malignant melanoma of skin: Secondary | ICD-10-CM | POA: Diagnosis not present

## 2022-08-18 DIAGNOSIS — R634 Abnormal weight loss: Secondary | ICD-10-CM | POA: Diagnosis not present

## 2022-08-18 DIAGNOSIS — E785 Hyperlipidemia, unspecified: Secondary | ICD-10-CM | POA: Diagnosis not present

## 2022-08-18 DIAGNOSIS — Z7984 Long term (current) use of oral hypoglycemic drugs: Secondary | ICD-10-CM | POA: Diagnosis not present

## 2022-08-18 DIAGNOSIS — E1165 Type 2 diabetes mellitus with hyperglycemia: Secondary | ICD-10-CM

## 2022-08-18 LAB — CBC WITH DIFFERENTIAL/PLATELET
Basophils Absolute: 0.1 10*3/uL (ref 0.0–0.1)
Basophils Relative: 0.7 % (ref 0.0–3.0)
Eosinophils Absolute: 0.2 10*3/uL (ref 0.0–0.7)
Eosinophils Relative: 3 % (ref 0.0–5.0)
HCT: 39.5 % (ref 36.0–46.0)
Hemoglobin: 13.1 g/dL (ref 12.0–15.0)
Lymphocytes Relative: 29 % (ref 12.0–46.0)
Lymphs Abs: 2.3 10*3/uL (ref 0.7–4.0)
MCHC: 33 g/dL (ref 30.0–36.0)
MCV: 87.3 fl (ref 78.0–100.0)
Monocytes Absolute: 0.7 10*3/uL (ref 0.1–1.0)
Monocytes Relative: 9.1 % (ref 3.0–12.0)
Neutro Abs: 4.6 10*3/uL (ref 1.4–7.7)
Neutrophils Relative %: 58.2 % (ref 43.0–77.0)
Platelets: 335 10*3/uL (ref 150.0–400.0)
RBC: 4.53 Mil/uL (ref 3.87–5.11)
RDW: 13 % (ref 11.5–15.5)
WBC: 7.9 10*3/uL (ref 4.0–10.5)

## 2022-08-18 LAB — COMPREHENSIVE METABOLIC PANEL
ALT: 25 U/L (ref 0–35)
AST: 24 U/L (ref 0–37)
Albumin: 4.2 g/dL (ref 3.5–5.2)
Alkaline Phosphatase: 48 U/L (ref 39–117)
BUN: 9 mg/dL (ref 6–23)
CO2: 29 mEq/L (ref 19–32)
Calcium: 9.7 mg/dL (ref 8.4–10.5)
Chloride: 100 mEq/L (ref 96–112)
Creatinine, Ser: 0.74 mg/dL (ref 0.40–1.20)
GFR: 75.13 mL/min (ref >60.00)
Glucose, Bld: 161 mg/dL — ABNORMAL HIGH (ref 70–99)
Potassium: 4.1 mEq/L (ref 3.5–5.1)
Sodium: 138 mEq/L (ref 135–145)
Total Bilirubin: 0.5 mg/dL (ref 0.2–1.2)
Total Protein: 6.5 g/dL (ref 6.0–8.3)

## 2022-08-18 LAB — SEDIMENTATION RATE: Sed Rate: 15 mm/hr (ref 0–30)

## 2022-08-18 LAB — C-REACTIVE PROTEIN: CRP: <1 mg/dL (ref 0.5–20.0)

## 2022-08-18 LAB — TSH: TSH: 2.33 u[IU]/mL (ref 0.35–5.50)

## 2022-08-18 LAB — HEMOGLOBIN A1C: Hgb A1c MFr Bld: 7.1 % — ABNORMAL HIGH (ref 4.6–6.5)

## 2022-08-18 NOTE — Progress Notes (Signed)
Phone [phone] In person visit   Subjective:   Gabrielle Robinson is a 82 y.o. year old very pleasant female patient who presents for/with See problem oriented charting Chief Complaint  Patient presents with   Medical Management of Chronic Issues   Diabetes   Past Medical History-  Patient Active Problem List   Diagnosis Date Noted   History of cerebral infarction 05/12/2022    Priority: High   Memory loss 05/12/2022    Priority: High   History of melanoma 07/27/2021    Priority: High   Diabetes mellitus (HCC) 04/01/2013    Priority: High   Vitamin D deficiency 10/22/2021    Priority: Medium    Atherosclerosis of aorta (HCC) 07/27/2021    Priority: Medium    Major depressive disorder with single episode, in full remission (HCC) 05/28/2015    Priority: Medium    Hyperlipemia 01/05/2009    Priority: Medium    Bilateral shoulder pain 05/28/2015    Priority: Low    Medications- reviewed and updated Current Outpatient Medications  Medication Sig Dispense Refill   aspirin EC 81 MG tablet Take 81 mg by mouth daily. Swallow whole.     Blood Glucose Monitoring Suppl DEVI 1 each by Does not apply route in the morning, at noon, and at bedtime. May substitute to any manufacturer covered by patient's insurance. 1 each 0   Cyanocobalamin (VITAMIN B 12 PO) Take by mouth.     metFORMIN (GLUCOPHAGE XR) 500 MG 24 hr tablet Take 2 tablets (1,000 mg total) by mouth daily with breakfast. 180 tablet 3   Multiple Vitamin (MULTIVITAMIN) capsule Take 1 capsule by mouth daily.     Polyethyl Glycol-Propyl Glycol (SYSTANE ULTRA OP) Place 1 drop into both eyes daily as needed (dry eyes).     rosuvastatin (CRESTOR) 10 MG tablet Take 1 tablet (10 mg total) by mouth daily. 90 tablet 3   No current facility-administered medications for this visit.     Objective:  BP 122/70   Pulse 92   Temp 97.7 F (36.5 C)   Ht 5\' 1"  (1.549 m)   Wt 95 lb 9.6 oz (43.4 kg)   SpO2 97%   BMI 18.06 kg/m  Gen:  NAD, resting comfortably, thin appearing in face CV: RRR no murmurs rubs or gallops Lungs: CTAB no crackles, wheeze, rhonchi Abdomen: soft/nontender/nondistended/normal bowel sounds.  Ext: no edema Skin: warm, dry Neuro: looks to husband for many answers  Diabetic Foot Exam - Simple   Simple Foot Form Diabetic Foot exam was performed with the following findings: Yes 08/18/2022  1:59 PM  Visual Inspection No deformities, no ulcerations, no other skin breakdown bilaterally: Yes Sensation Testing Intact to touch and monofilament testing bilaterally: Yes Pulse Check Posterior Tibialis and Dorsalis pulse intact bilaterally: Yes Comments        Assessment and Plan   # Unintentional weight loss-but could be medication related S: At June 08, 2022 visit had noticed significant decreased appetite and activity.  Had taken donepezil and had vomiting episodes and we had also recently increased metformin.  She stopped the donepezil.  We did not increase metformin.  She is on Rybelsus 14 mg.  Weight loss could be related to poorly controlled A1c as well/dehydration -down 16 lbs in 3 months  Today reports she simply is not eating A/P: weight loss could be related to poorly controlled diabetes or due to appetite suppression from rybelsus or metformin (stop rybelsus) but we also opted to do unintentional weight  loss workup and have close follow up in perhaps a month -previously thought could be related to donepezil - but she later restarted this without vomiting or diarrhea thankfully  -decliens HIV and HCV screen  # Memory concerns S:Medication: - trial donepezil 5 mg daily started 05/16/22- threw up everyday for a week and stopped- later restarted and has done well without issues (other than weight loss which wondered if this could be contributing) A/P: with memory issues and weight loss- prior vomiting on donepazil - considered stopping this again but they wanted to start with labs and stopping  rybelsus first   # Diabetes- peak a1c of 10.7 in November 2022 off meds S: Medication:Metformin 1000 mg extended release (only takes 500mg  on 05/12/22- afterwards increase to 1000mg ), Rybelsus 14 mg CBGs- usually in th 100s Exercise and diet- not eating well   Lab Results  Component Value Date   HGBA1C 9.4 (H) 05/12/2022   HGBA1C 8.0 (A) 01/28/2022   HGBA1C 9.3 (A) 10/22/2021  A/P: diabetes- with low appetite I am going to stop rybelsus- may even consider holdoing metformin in future- update a1c today but we may need to look at medications that would not cause weight loss like glipizide (but can cause low sugar) or honestly even insulin - consider reducing metformin on one hand but don't want sugar to jump too much on the other hand   #History of lacunar infarct #hyperlipidemia S: Medication:Pravastatin 40 mg--> rosuvastatin 10 mg, aspirin 81 mg Lab Results  Component Value Date   CHOL 238 (H) 07/19/2021   HDL 61.50 07/19/2021   LDLCALC 143 (H) 07/19/2021   LDLDIRECT 93 01/28/2022   TRIG 169.0 (H) 07/19/2021   CHOLHDL 4 07/19/2021  A/P: cholesterol slightly above goal but we are hesitant to increase any medications at this time with weight loss   Recommended follow up: Return in about 1 month (around 09/18/2022) for followup or sooner if needed.Schedule b4 you leave. Future Appointments  Date Time Provider Department Center  08/23/2022  2:30 PM Elwyn Reach [facility] None  04/11/2023  3:15 PM [facility] ANNUAL WELLNESS VISIT 1 [facility]  05/30/2023  3:15 PM [facility] None  05/30/2023  3:45 PM Si Gaul, MD [facility] None   Lab/Order associations:   ICD-10-CM   1. Unintentional weight loss  R63.4 CBC with Differential/Platelet    Comprehensive metabolic panel    Hemoglobin A1c    TSH    Fecal occult blood, imunochemical    DG Chest 2 View    Sedimentation rate    C-reactive protein    POCT Urinalysis Dipstick (Automated)    2. Type 2 diabetes  mellitus with hyperglycemia, without long-term current use of insulin (HCC)  E11.65 Microalbumin / creatinine urine ratio    CBC with Differential/Platelet    Comprehensive metabolic panel    Hemoglobin A1c    3. Memory loss  R41.3     4. Hyperlipidemia, unspecified hyperlipidemia type  E78.5      Return precautions advised.  Tana Conch, MD

## 2022-08-18 NOTE — Patient Instructions (Addendum)
diabetes- with low appetite I am going to stop rybelsus- may even consider holdoing metformin in future- update a1c today but we may need to look at medications that would not cause weight loss like glipizide (but can cause low sugar) or honestly even insulin  STOP rybelsus to see if that will help appetite  Ensure diabetes care or Boost for diabetes control or glucerna between meals or other similar sugar friendly shakes would be a good idea to try to keep weight up  Please stop by lab before you go If you have mychart- we will send your results within 3 business days of [place] receiving them.  If you do not have mychart- we will call you about results within 5 business days of [place] receiving them.  *please also note that you will see labs on mychart as soon as they post. I will later go in and write notes on them- will say "notes from Dr. Durene Cal"   Please go to [place]  central X-ray (updated 05/23/2019) - located [address] across the street from [place] - in the basement - Hours: 8:30-5:00 PM M-F (with lunch from 12:30- 1 PM). You do NOT need an appointment.    Recommended follow up: Return in about 1 month (around 09/18/2022) for followup or sooner if needed.Schedule b4 you leave.

## 2022-08-23 ENCOUNTER — Ambulatory Visit (INDEPENDENT_AMBULATORY_CARE_PROVIDER_SITE_OTHER)
Admission: RE | Admit: 2022-08-23 | Discharge: 2022-08-23 | Disposition: A | Discharge status: Home / Self Care | Payer: Medicare PPO | Source: Ambulatory Visit | Attending: Family Medicine | Admitting: Family Medicine

## 2022-08-23 ENCOUNTER — Ambulatory Visit: Payer: Medicare PPO | Admitting: Physician Assistant

## 2022-08-23 ENCOUNTER — Encounter: Payer: Self-pay | Admitting: Physician Assistant

## 2022-08-23 VITALS — BP 148/67 | HR 78 | Resp 18 | Ht 61.0 in | Wt 97.0 lb

## 2022-08-23 DIAGNOSIS — F0390 Unspecified dementia without behavioral disturbance: Secondary | ICD-10-CM | POA: Diagnosis not present

## 2022-08-23 DIAGNOSIS — R634 Abnormal weight loss: Secondary | ICD-10-CM | POA: Diagnosis not present

## 2022-08-23 DIAGNOSIS — R413 Other amnesia: Secondary | ICD-10-CM

## 2022-08-23 NOTE — Progress Notes (Signed)
Assessment/Plan:   Major Neurocognitive Disorder , late onset  Gabrielle Robinson is a very pleasant 82 y.o. LH female with a history of hypertension, hyperlipidemia, DM2, history of depression, vitamin D deficiency, history of melanoma, B12 deficiency, chronic shoulder pain, history of CVA per imaging seen today in follow up for memory loss. MRI brain from January 2024 personally reviewed was remarkable for mild chronic small vessel ischemic changes within the cerebral white matter, chronic lacunar infarct within the left basal ganglia, moderate to advanced generalized cerebral atrophy, mild cerebellar atrophy.  Patient is on donepezil 5 mg now, tolerating it well, after a period of GI sensitivity/vomiting a few weeks ago (she restarted last week). She shows some cognitive decline in her STM, and to some extent on her LTM.  She continues to perform her ADLs and drive. MMSE today is 19/30.     Follow up in 6 months. Continue 5 mg daily, recommend to  increase to 10 mg  daily if tolerated.  Alcohol cessation recommended. Monitor driving Recommend good control of her cardiovascular risk factors Continue to control mood as per PCP    Subjective:    This patient is accompanied in the office by her husband who supplements the history.  Previous records as well as any outside records available were reviewed prior to todays visit. Patient was last seen on 05/17/2022, at which time her MoCA was 18/30.   Any changes in memory since last visit?  Patient denies any issues but husband reports that her memory is worse, with some confusion about dates and times, and difficulty remembering recent conversations and names of people. "The concept of time is affected". Sometimes she cannot think of the word and works around it.  She has not been reading as she did before  repeats oneself?  Endorsed, especially about the appointments.  Disoriented when walking into a room?  Patient denies   Leaving objects in  unusual places? denies.  She may misplace some things, but quickly finds them.  Wandering behavior?  denies   Any personality changes since last visit?  She can become frustrated, defensive at times due to memory issues  Any worsening depression?:  denies   Hallucinations or paranoia?  denies   Seizures?    denies    Any sleep changes?  Sleeps well . Denies vivid dreams, REM behavior or sleepwalking   Sleep apnea?   denies   Any hygiene concerns?    denies   Independent of bathing and dressing?  Endorsed  Does the patient needs help with medications?  Husband is in charge   Who is in charge of the finances?  Husband and is in charge     Any changes in appetite?  She always eats 2 meals a day, "she is never too hungry".  She has some unintentional weight loss recently but "is bouncing back"   Patient have trouble swallowing?  denies   Does the patient cook?  No   Any headaches?   denies   Chronic back pain  denies   Ambulates with difficulty?  denies   Recent falls or head injuries? denies     Unilateral weakness, numbness or tingling? denies   Any tremors?  denies   Any anosmia?  Patient denies   Any incontinence of urine?  denies   Any bowel dysfunction?  Intermittent diarrhea      Patient lives with her husband  Does the patient drive? She continues to drive, but she may "miss  a road or two "-she does not agree with her husband  Alcohol?  She drinks a glass of wine at night "maybe a little less, some days none ".  Pertinent labs B1 31, A1c 9.4, TSH 2.89, B12 1057  Initial visit on 05/17/2022 How long did patient have memory difficulties? She denies any memory issues. Husband noticed some slight memory issues 2 months ago, confusion about dates and times and some difficulty remembering recent conversations and people names  repeats oneself? Denies  Disoriented when walking into a room?  Patient denies Leaving objects in unusual places?  Endorsed, but not in unusual places    Wandering behavior? denies   Any personality changes since last visit? "She gets discouraged when solving an issue, but nothing radical" Any worsening depression?:  denies   Hallucinations or paranoia?  denies   Seizures?   denies    Any sleep changes?  Denies  vivid dreams, REM behavior or sleepwalking   Sleep apnea? denies   Any hygiene concerns?  denies   Independent of bathing and dressing?  Endorsed  Does the patient need help with medications? Husband is in charge   Who is in charge of the finances? Husband  is in charge     Any changes in appetite?  Both eat 2 meals, "not too hungry"    Patient have trouble swallowing?  Denies   Does the patient cook? No  Any kitchen accidents such as leaving the stove on? Patient denies   Any headaches?  Denies   Chronic back pain  Denies  "I never had pain" Ambulates with difficulty?   Denies   Recent falls or head injuries?  denies     Unilateral weakness, numbness or tingling?  denies   Any tremors?  denies   Any anosmia?  denies   Any incontinence of urine?  denies   Any bowel dysfunction?  Intermittent diarrhea.  Patient lives  with her husband  History of heavy alcohol intake?glass or two of wine at night, "maybe a little more but not overindulgent" History of heavy tobacco use?   denies   Family history of dementia?  Denies  Dose patient drive? She had one episode where she" missed a road or two"   Retired Runner, broadcasting/film/video. College degree    MRI of the brain January 2024 personally reviewed was remarkable for chronic lacunar infarct within the left basal ganglia and mild chronic small vessel ischemic changes, as well as generalized cerebral atrophy and mild cerebellar atrophy   Pertinent labs, A1c on November 2023 was 8, total cholesterol 238, LDL 143, triglycerides 169.  January 2024 TSH 2.89, B12 1057   PREVIOUS MEDICATIONS:    CURRENT MEDICATIONS:  Outpatient Encounter Medications as of 08/23/2022  Medication Sig   aspirin EC 81 MG  tablet Take 81 mg by mouth daily. Swallow whole.   Blood Glucose Monitoring Suppl DEVI 1 each by Does not apply route in the morning, at noon, and at bedtime. May substitute to any manufacturer covered by patient's insurance.   Cyanocobalamin (VITAMIN B 12 PO) Take by mouth.   metFORMIN (GLUCOPHAGE XR) 500 MG 24 hr tablet Take 2 tablets (1,000 mg total) by mouth daily with breakfast.   Multiple Vitamin (MULTIVITAMIN) capsule Take 1 capsule by mouth daily.   Polyethyl Glycol-Propyl Glycol (SYSTANE ULTRA OP) Place 1 drop into both eyes daily as needed (dry eyes).   rosuvastatin (CRESTOR) 10 MG tablet Take 1 tablet (10 mg total) by mouth daily.   No  facility-administered encounter medications on file as of 08/23/2022.       08/23/2022    4:00 PM 10/25/2021    8:42 AM  MMSE - Mini Mental State Exam  Orientation to time 4 5  Orientation to Place 5 5  Registration 3 3  Attention/ Calculation 0 5  Recall 0 2  Language- name 2 objects 1 2  Language- repeat 1 1  Language- follow 3 step command 3 3  Language- read & follow direction 1 1  Write a sentence 1 1  Copy design 0 1  Total score 19 29      05/18/2022   10:00 AM 05/16/2022    7:00 PM  Montreal Cognitive Assessment   Visuospatial/ Executive (0/5) 2 1  Naming (0/3) 3 3  Attention: Read list of digits (0/2) 2 2  Attention: Read list of letters (0/1) 1 1  Attention: Serial 7 subtraction starting at 100 (0/3) 1 1  Language: Repeat phrase (0/2) 1 1  Language : Fluency (0/1) 1 1  Abstraction (0/2) 1 1  Delayed Recall (0/5) 1 1  Orientation (0/6) 5 6  Total 18 18  Adjusted Score (based on education) 18 18    Objective:     PHYSICAL EXAMINATION:    VITALS:   Vitals:   08/23/22 1418  BP: (!) 148/67  Pulse: 78  Resp: 18  SpO2: 97%  Weight: 97 lb (44 kg)  Height: 5\' 1"  (1.549 m)    GEN:  The patient appears stated age and is in NAD. HEENT:  Normocephalic, atraumatic.   Neurological examination:  General: NAD,  well-groomed, appears stated age. Orientation: The patient is alert. Oriented to person, place and time Cranial nerves: There is good facial symmetry.The speech is fluent and clear. No aphasia or dysarthria. Fund of knowledge is reduced. Recent and remote memory are impaired. Attention and concentration are reduced.  Able to name objects 1/2 and repeat phrases.  Hearing is intact to conversational tone.  Delayed recall 0/3  Sensation: Sensation is intact to light touch throughout Motor: Strength is at least antigravity x4. DTR's 2/4 in UE/LE     Movement examination: Tone: There is normal tone in the UE/LE Abnormal movements:  no tremor.  No myoclonus.  No asterixis.   Coordination:  There is no decremation with RAM's. Normal finger to nose  Gait and Station: The patient has no difficulty arising out of a deep-seated chair without the use of the hands. The patient's stride length is good.  Gait is cautious and narrow.    Thank you for allowing [place] the opportunity to participate in the care of this nice patient. Please do not hesitate to contact us for any questions or concerns.   Total time spent on today's visit was 38 minutes dedicated to this patient today, preparing to see patient, examining the patient, ordering tests and/or medications and counseling the patient, documenting clinical information in the EHR or other health record, independently interpreting results and communicating results to the patient/family, discussing treatment and goals, answering patient's questions and coordinating care.  Cc:  Shelva Majestic, MD  Marlowe Kays 08/23/2022 4:25 PM

## 2022-08-23 NOTE — Patient Instructions (Addendum)
It was a pleasure to see you today at our office.   Recommendations:    Follow up in 6 months  Continue Donepezil 5  mg daily if tolerated, consider increasing to 10 mg a day for better coverage  Continue replenishing B12 and D Monitor the sugars   Whom to call:  Memory  decline, memory medications: Call our office [phone]   For psychiatric meds, mood meds: Please have your primary care physician manage these medications.     For assessment of decision of mental capacity and competency:  Call Dr. Erick Blinks, geriatric psychiatrist at [phone]  For guidance in geriatric dementia issues please call [facility] [phone]     If you have any severe symptoms of a stroke, or other severe issues such as confusion,severe chills or fever, etc call 911 or go to the ER as you may need to be evaluated further   Feel free to visit Facebook page " [email or web address]" for tips of how to care for people with memory problems.      RECOMMENDATIONS FOR ALL PATIENTS WITH MEMORY PROBLEMS: 1. Continue to exercise (Recommend 30 minutes of walking everyday, or 3 hours every week) 2. Increase social interactions - continue going to [place] and enjoy social gatherings with friends and family 3. Eat healthy, avoid fried foods and eat more fruits and vegetables 4. Maintain adequate blood pressure, blood sugar, and blood cholesterol level. Reducing the risk of stroke and cardiovascular disease also helps promoting better memory. 5. Avoid stressful situations. Live a simple life and avoid aggravations. Organize your time and prepare for the next day in anticipation. 6. Sleep well, avoid any interruptions of sleep and avoid any distractions in the bedroom that may interfere with adequate sleep quality 7. Avoid sugar, avoid sweets as there is a strong link between excessive sugar intake, diabetes, and cognitive impairment We discussed the Mediterranean diet, which has been shown to help patients  reduce the risk of progressive memory disorders and reduces cardiovascular risk. This includes eating fish, eat fruits and green leafy vegetables, nuts like almonds and hazelnuts, walnuts, and also use olive oil. Avoid fast foods and fried foods as much as possible. Avoid sweets and sugar as sugar use has been linked to worsening of memory function.  There is always a concern of gradual progression of memory problems. If this is the case, then we may need to adjust level of care according to patient needs. Support, both to the patient and caregiver, should then be put into place.       FALL PRECAUTIONS: Be cautious when walking. Scan the area for obstacles that may increase the risk of trips and falls. When getting up in the mornings, sit up at the edge of the bed for a few minutes before getting out of bed. Consider elevating the bed at the head end to avoid drop of blood pressure when getting up. Walk always in a well-lit room (use night lights in the walls). Avoid area rugs or power cords from appliances in the middle of the walkways. Use a walker or a cane if necessary and consider physical therapy for balance exercise. Get your eyesight checked regularly.  FINANCIAL OVERSIGHT: Supervision, especially oversight when making financial decisions or transactions is also recommended.  HOME SAFETY: Consider the safety of the kitchen when operating appliances like stoves, microwave oven, and blender. Consider having supervision and share cooking responsibilities until no longer able to participate in those. Accidents with firearms and other hazards in  the house should be identified and addressed as well.   ABILITY TO BE LEFT ALONE: If patient is unable to contact 911 operator, consider using LifeLine, or when the need is there, arrange for someone to stay with patients. Smoking is a fire hazard, consider supervision or cessation. Risk of wandering should be assessed by caregiver and if detected at any  point, supervision and safe proof recommendations should be instituted.  MEDICATION SUPERVISION: Inability to self-administer medication needs to be constantly addressed. Implement a mechanism to ensure safe administration of the medications.   DRIVING: Regarding driving, in patients with progressive memory problems, driving will be impaired. We advise to have someone else do the driving if trouble finding directions or if minor accidents are reported. Independent driving assessment is available to determine safety of driving.   If you are interested in the driving assessment, you can contact the following:  [employer] in [place] [phone]  [facility] [phone]  [facility] [phone] or [phone]    Mediterranean Diet A Mediterranean diet refers to food and lifestyle choices that are based on the traditions of countries located on the [employer]. This way of eating has been shown to help prevent certain conditions and improve outcomes for people who have chronic diseases, like kidney disease and heart disease. What are tips for following this plan? Lifestyle  Cook and eat meals together with your family, when possible. Drink enough fluid to keep your urine clear or pale yellow. Be physically active every day. This includes: Aerobic exercise like running or swimming. Leisure activities like gardening, walking, or housework. Get 7-8 hours of sleep each night. If recommended by your health care provider, drink red wine in moderation. This means 1 glass a day for nonpregnant women and 2 glasses a day for men. A glass of wine equals 5 oz (150 mL). Reading food labels  Check the serving size of packaged foods. For foods such as rice and pasta, the serving size refers to the amount of cooked product, not dry. Check the total fat in packaged foods. Avoid foods that have saturated fat or trans  fats. Check the ingredients list for added sugars, such as corn syrup. Shopping  At the grocery store, buy most of your food from the areas near the walls of the store. This includes: Fresh fruits and vegetables (produce). Grains, beans, nuts, and seeds. Some of these may be available in unpackaged forms or large amounts (in bulk). Fresh seafood. Poultry and eggs. Low-fat dairy products. Buy whole ingredients instead of prepackaged foods. Buy fresh fruits and vegetables in-season from local farmers markets. Buy frozen fruits and vegetables in resealable bags. If you do not have access to quality fresh seafood, buy precooked frozen shrimp or canned fish, such as tuna, salmon, or sardines. Buy small amounts of raw or cooked vegetables, salads, or olives from the deli or salad bar at your store. Stock your pantry so you always have certain foods on hand, such as olive oil, canned tuna, canned tomatoes, rice, pasta, and beans. Cooking  Cook foods with extra-virgin olive oil instead of using butter or other vegetable oils. Have meat as a side dish, and have vegetables or grains as your main dish. This means having meat in small portions or adding small amounts of meat to foods like pasta or stew. Use beans or vegetables instead of meat in common dishes like chili or lasagna. Experiment with different cooking methods. Try roasting or  broiling vegetables instead of steaming or sauteing them. Add frozen vegetables to soups, stews, pasta, or rice. Add nuts or seeds for added healthy fat at each meal. You can add these to yogurt, salads, or vegetable dishes. Marinate fish or vegetables using olive oil, lemon juice, garlic, and fresh herbs. Meal planning  Plan to eat 1 vegetarian meal one day each week. Try to work up to 2 vegetarian meals, if possible. Eat seafood 2 or more times a week. Have healthy snacks readily available, such as: Vegetable sticks with hummus. Greek yogurt. Fruit and nut  trail mix. Eat balanced meals throughout the week. This includes: Fruit: 2-3 servings a day Vegetables: 4-5 servings a day Low-fat dairy: 2 servings a day Fish, poultry, or lean meat: 1 serving a day Beans and legumes: 2 or more servings a week Nuts and seeds: 1-2 servings a day Whole grains: 6-8 servings a day Extra-virgin olive oil: 3-4 servings a day Limit red meat and sweets to only a few servings a month What are my food choices? Mediterranean diet Recommended Grains: Whole-grain pasta. Brown rice. Bulgar wheat. Polenta. Couscous. Whole-wheat bread. Orpah Cobb. Vegetables: Artichokes. Beets. Broccoli. Cabbage. Carrots. Eggplant. Green beans. Chard. Kale. Spinach. Onions. Leeks. Peas. Squash. Tomatoes. Peppers. Radishes. Fruits: Apples. Apricots. Avocado. Berries. Bananas. Cherries. Dates. Figs. Grapes. Lemons. Melon. Oranges. Peaches. Plums. Pomegranate. Meats and other protein foods: Beans. Almonds. Sunflower seeds. Pine nuts. Peanuts. Cod. Salmon. Scallops. Shrimp. Tuna. Tilapia. Clams. Oysters. Eggs. Dairy: Low-fat milk. Cheese. Greek yogurt. Beverages: Water. Red wine. Herbal tea. Fats and oils: Extra virgin olive oil. Avocado oil. Grape seed oil. Sweets and desserts: [place] yogurt with honey. Baked apples. Poached pears. Trail mix. Seasoning and other foods: Basil. Cilantro. Coriander. Cumin. Mint. Parsley. Sage. Rosemary. Tarragon. Garlic. Oregano. Thyme. Pepper. Balsalmic vinegar. Tahini. Hummus. Tomato sauce. Olives. Mushrooms. Limit these Grains: Prepackaged pasta or rice dishes. Prepackaged cereal with added sugar. Vegetables: Deep fried potatoes (french fries). Fruits: Fruit canned in syrup. Meats and other protein foods: Beef. Pork. Lamb. Poultry with skin. Hot dogs. Tomasa Blase. Dairy: Ice cream. Sour cream. Whole milk. Beverages: Juice. Sugar-sweetened soft drinks. Beer. Liquor and spirits. Fats and oils: Butter. Canola oil. Vegetable oil. Beef fat (tallow).  Lard. Sweets and desserts: Cookies. Cakes. Pies. Candy. Seasoning and other foods: Mayonnaise. Premade sauces and marinades. The items listed may not be a complete list. Talk with your dietitian about what dietary choices are right for you. Summary The Mediterranean diet includes both food and lifestyle choices. Eat a variety of fresh fruits and vegetables, beans, nuts, seeds, and whole grains. Limit the amount of red meat and sweets that you eat. Talk with your health care provider about whether it is safe for you to drink red wine in moderation. This means 1 glass a day for nonpregnant women and 2 glasses a day for men. A glass of wine equals 5 oz (150 mL). This information is not intended to replace advice given to you by your health care provider. Make sure you discuss any questions you have with your health care provider. Document Released: 11/05/2015 Document Revised: 12/08/2015 Document Reviewed: 11/05/2015 Elsevier Interactive Patient Education  2017 [employer].     [address]

## 2022-08-25 DIAGNOSIS — Z961 Presence of intraocular lens: Secondary | ICD-10-CM | POA: Diagnosis not present

## 2022-09-05 ENCOUNTER — Telehealth: Payer: Self-pay | Admitting: Family Medicine

## 2022-09-05 NOTE — Telephone Encounter (Signed)
Advised to see provider within 24 hrs. Will call to schedule.  Patient Name First: Gabrielle CHRISTIANA Last: Robinson Gender: Female DOB: 1940-10-27 Age: 82 Y 10 M 16 D Return Phone Number: [phone] (Primary), [phone] (Secondary) Address: City/ State/ Zip: [place]  [address] Client [facility] - Human resources officer [facility] Provider Tana Conch- MD Contact Type Call Who Is Calling Patient / Member / Family / Caregiver Call Type Triage / Clinical Caller Name Charliegh Vasudevan Relationship To Patient Spouse Return Phone Number [phone] (Primary) Chief Complaint Diarrhea Reason for Call Symptomatic / Request for Health Information Initial Comment Caller says his wife is having frequent diarrhea. She has been taking a probiotic and they are not sure what to do. Translation No Nurse Assessment Nurse: Logan Bores, RN, Cala Bradford Date/Time (Eastern Time): 09/04/2022 8:18:20 PM Confirm and document reason for call. If symptomatic, describe symptoms. ---Caller says his wife is having frequent diarrhea. She has been taking a probiotic. Has started taking diarrhea relief. Started yesterday. Is having 4-5 episodes a day. Feels fine otherwise Does the patient have any new or worsening symptoms? ---Yes Will a triage be completed? ---Yes Related visit to physician within the last 2 weeks? ---N/A Does the PT have any chronic conditions? (i.e. diabetes, asthma, this includes High risk factors for pregnancy, etc.) ---Yes List chronic conditions. ---Diabetes Is this a behavioral health or substance abuse call? ---No Guidelines Guideline Title Affirmed Question Affirmed Notes Nurse Date/Time (Eastern Time) Diarrhea [1] MODERATE diarrhea (e.g., 4-6 times / day more than normal) AND [2] age > 68 years Logan Bores, RN, Cala Bradford 09/04/2022 8:21:34 PM Disp. Time Lamount Cohen Time) Disposition Final User 09/04/2022 8:25:03 PM See PCP  within 24 Hours Yes Logan Bores, RN, Cala Bradford Final Disposition 09/04/2022 8:25:03 PM See PCP within 24 Hours Yes Logan Bores, RN, Anders Simmonds Disagree/Comply Comply Caller Understands Yes PreDisposition Did not know what to do Care Advice Given Per Guideline SEE PCP WITHIN 24 HOURS: FLUID THERAPY DURING SEVERE DIARRHEA: * Drink more fluids, at least 8 to 10 cups daily. One cup equals 8 oz (240 ml). * WATER: For mild to moderate diarrhea, water is often the best liquid to drink. You should also eat some salty foods (e.g., potato chips, pretzels, saltine crackers). This is important to make sure you are getting enough salt, sugars, and fluids to meet your body's needs. * SPORTS DRINKS: You can also drink half-strength sports drinks (e.g., Gatorade, Powerade) to help treat and prevent dehydration. Mix the sports drink half and half with water. * AVOID caffeinated beverages. Reason: Caffeine is mildly dehydrating. * AVOID carbonated soft drinks (soda) as these can make your diarrhea worse. WASH YOUR HANDS: * Wash your hands after using the bathroom. CALL BACK IF: * You become worse CARE ADVICE given per Diarrhea (Adult) guideline. Referrals REFERRED TO PCP OFFICE

## 2022-09-05 NOTE — Telephone Encounter (Signed)
Pt's spouse states pt is feeling better. Scheduled a follow up on 09/09/22 at 4 pm just in case. If not needed, will cancel & come in at already scheduled time 09/23/22.

## 2022-09-07 DIAGNOSIS — Z85828 Personal history of other malignant neoplasm of skin: Secondary | ICD-10-CM | POA: Diagnosis not present

## 2022-09-07 DIAGNOSIS — D225 Melanocytic nevi of trunk: Secondary | ICD-10-CM | POA: Diagnosis not present

## 2022-09-07 DIAGNOSIS — Z85820 Personal history of malignant melanoma of skin: Secondary | ICD-10-CM | POA: Diagnosis not present

## 2022-09-07 DIAGNOSIS — L853 Xerosis cutis: Secondary | ICD-10-CM | POA: Diagnosis not present

## 2022-09-07 DIAGNOSIS — L821 Other seborrheic keratosis: Secondary | ICD-10-CM | POA: Diagnosis not present

## 2022-09-07 DIAGNOSIS — L570 Actinic keratosis: Secondary | ICD-10-CM | POA: Diagnosis not present

## 2022-09-07 DIAGNOSIS — D692 Other nonthrombocytopenic purpura: Secondary | ICD-10-CM | POA: Diagnosis not present

## 2022-09-09 ENCOUNTER — Ambulatory Visit: Payer: Medicare PPO | Admitting: Family Medicine

## 2022-09-23 ENCOUNTER — Encounter: Payer: Self-pay | Admitting: Family Medicine

## 2022-09-23 ENCOUNTER — Other Ambulatory Visit: Payer: Self-pay | Admitting: Family Medicine

## 2022-09-23 ENCOUNTER — Ambulatory Visit: Payer: Medicare PPO | Admitting: Family Medicine

## 2022-09-23 VITALS — BP 130/70 | HR 97 | Temp 98.1°F | Ht 61.0 in | Wt 102.8 lb

## 2022-09-23 DIAGNOSIS — R7989 Other specified abnormal findings of blood chemistry: Secondary | ICD-10-CM

## 2022-09-23 DIAGNOSIS — Z7984 Long term (current) use of oral hypoglycemic drugs: Secondary | ICD-10-CM | POA: Diagnosis not present

## 2022-09-23 DIAGNOSIS — R600 Localized edema: Secondary | ICD-10-CM | POA: Diagnosis not present

## 2022-09-23 DIAGNOSIS — E1165 Type 2 diabetes mellitus with hyperglycemia: Secondary | ICD-10-CM

## 2022-09-23 DIAGNOSIS — E785 Hyperlipidemia, unspecified: Secondary | ICD-10-CM | POA: Diagnosis not present

## 2022-09-23 DIAGNOSIS — E559 Vitamin D deficiency, unspecified: Secondary | ICD-10-CM | POA: Diagnosis not present

## 2022-09-23 LAB — MICROALBUMIN / CREATININE URINE RATIO
Creatinine,U: 119.3 mg/dL
Microalb Creat Ratio: 1.1 mg/g (ref 0.0–30.0)
Microalb, Ur: 1.3 mg/dL (ref 0.0–1.9)

## 2022-09-23 LAB — CBC WITH DIFFERENTIAL/PLATELET
Basophils Absolute: 0.1 10*3/uL (ref 0.0–0.1)
Basophils Relative: 0.8 % (ref 0.0–3.0)
Eosinophils Absolute: 0.3 10*3/uL (ref 0.0–0.7)
Eosinophils Relative: 4.1 % (ref 0.0–5.0)
HCT: 37.3 % (ref 36.0–46.0)
Hemoglobin: 12.1 g/dL (ref 12.0–15.0)
Lymphocytes Relative: 16.5 % (ref 12.0–46.0)
Lymphs Abs: 1.4 10*3/uL (ref 0.7–4.0)
MCHC: 32.5 g/dL (ref 30.0–36.0)
MCV: 90.3 fl (ref 78.0–100.0)
Monocytes Absolute: 0.7 10*3/uL (ref 0.1–1.0)
Monocytes Relative: 8.7 % (ref 3.0–12.0)
Neutro Abs: 5.9 10*3/uL (ref 1.4–7.7)
Neutrophils Relative %: 69.9 % (ref 43.0–77.0)
Platelets: 330 10*3/uL (ref 150.0–400.0)
RBC: 4.13 Mil/uL (ref 3.87–5.11)
RDW: 13.8 % (ref 11.5–15.5)
WBC: 8.4 10*3/uL (ref 4.0–10.5)

## 2022-09-23 LAB — LIPID PANEL
Cholesterol: 153 mg/dL (ref 0–200)
HDL: 68.9 mg/dL (ref >39.00)
LDL Cholesterol: 64 mg/dL (ref 0–99)
NonHDL: 84.56
Total CHOL/HDL Ratio: 2
Triglycerides: 103 mg/dL (ref 0.0–149.0)
VLDL: 20.6 mg/dL (ref 0.0–40.0)

## 2022-09-23 LAB — URINALYSIS, ROUTINE W REFLEX MICROSCOPIC
Bilirubin Urine: NEGATIVE
Hgb urine dipstick: NEGATIVE
Ketones, ur: NEGATIVE
Leukocytes,Ua: NEGATIVE
Nitrite: NEGATIVE
Specific Gravity, Urine: 1.02 (ref 1.000–1.030)
Total Protein, Urine: NEGATIVE
Urine Glucose: 250 — AB
Urobilinogen, UA: 0.2 (ref 0.0–1.0)
pH: 6 (ref 5.0–8.0)

## 2022-09-23 LAB — VITAMIN D 25 HYDROXY (VIT D DEFICIENCY, FRACTURES): VITD: 50.56 ng/mL (ref 30.00–100.00)

## 2022-09-23 LAB — COMPREHENSIVE METABOLIC PANEL
ALT: 27 U/L (ref 0–35)
AST: 23 U/L (ref 0–37)
Albumin: 4.1 g/dL (ref 3.5–5.2)
Alkaline Phosphatase: 65 U/L (ref 39–117)
BUN: 16 mg/dL (ref 6–23)
CO2: 30 mEq/L (ref 19–32)
Calcium: 10.3 mg/dL (ref 8.4–10.5)
Chloride: 103 mEq/L (ref 96–112)
Creatinine, Ser: 0.74 mg/dL (ref 0.40–1.20)
GFR: 75.08 mL/min (ref >60.00)
Glucose, Bld: 294 mg/dL — ABNORMAL HIGH (ref 70–99)
Potassium: 4.4 mEq/L (ref 3.5–5.1)
Sodium: 139 mEq/L (ref 135–145)
Total Bilirubin: 0.4 mg/dL (ref 0.2–1.2)
Total Protein: 7 g/dL (ref 6.0–8.3)

## 2022-09-23 LAB — TSH: TSH: 2.64 u[IU]/mL (ref 0.35–5.50)

## 2022-09-23 LAB — BRAIN NATRIURETIC PEPTIDE: Pro B Natriuretic peptide (BNP): 152 pg/mL — ABNORMAL HIGH (ref 0.0–100.0)

## 2022-09-23 NOTE — Progress Notes (Signed)
Phone [phone] In person visit   Subjective:   Gabrielle Robinson is a 82 y.o. year old very pleasant female patient who presents for/with See problem oriented charting Chief Complaint  Patient presents with   unintentional weight loss   bilateral edema    Pt c/o bilateral ankle swelling since its "gotten hot outside"    Past Medical History-  Patient Active Problem List   Diagnosis Date Noted   History of cerebral infarction 05/12/2022    Priority: High   Memory loss 05/12/2022    Priority: High   History of melanoma 07/27/2021    Priority: High   Diabetes mellitus (HCC) 04/01/2013    Priority: High   Vitamin D deficiency 10/22/2021    Priority: Medium    Atherosclerosis of aorta (HCC) 07/27/2021    Priority: Medium    Major depressive disorder with single episode, in full remission (HCC) 05/28/2015    Priority: Medium    Hyperlipemia 01/05/2009    Priority: Medium    Bilateral shoulder pain 05/28/2015    Priority: Low    Medications- reviewed and updated Current Outpatient Medications  Medication Sig Dispense Refill   aspirin EC 81 MG tablet Take 81 mg by mouth daily. Swallow whole.     Blood Glucose Monitoring Suppl DEVI 1 each by Does not apply route in the morning, at noon, and at bedtime. May substitute to any manufacturer covered by patient's insurance. 1 each 0   Cyanocobalamin (VITAMIN B 12 PO) Take by mouth.     metFORMIN (GLUCOPHAGE XR) 500 MG 24 hr tablet Take 2 tablets (1,000 mg total) by mouth daily with breakfast. 180 tablet 3   Multiple Vitamin (MULTIVITAMIN) capsule Take 1 capsule by mouth daily.     Polyethyl Glycol-Propyl Glycol (SYSTANE ULTRA OP) Place 1 drop into both eyes daily as needed (dry eyes).     rosuvastatin (CRESTOR) 10 MG tablet Take 1 tablet (10 mg total) by mouth daily. 90 tablet 3   No current facility-administered medications for this visit.     Objective:  BP 130/70   Pulse 97   Temp 98.1 F (36.7 C)   Ht 5\' 1"  (1.549 m)    Wt 102 lb 12.8 oz (46.6 kg)   SpO2 99%   BMI 19.42 kg/m  Gen: NAD, resting comfortably CV: RRR no murmurs rubs or gallops Lungs: CTAB no crackles, wheeze, rhonchi Ext: 1+ edema Skin: warm, dry     Assessment and Plan   # Edema/prior unintentional weight loss now with gain S:even in summers usually doesn't notice swelling but has noted swelling for about 2 weeks. Has gained 5 lbs- we were hoping for weight gain but not necessarily swelling related. No shortness of breath . No orthopnea  Reports improved appetite off Rybelsus- not great but improved. Has been eating a fair amount of potato chips A/P: Patient's weight loss has subsided since stopping Rybelsus and in fact has gained 5 pounds-main concern is to make sure this is not related to fluid gain she has 1+ edema in her legs when none was noted last visit.  She has increased salt intake particular with potato chips and encouraged her to pull back on that - We will evaluate anemia with CBC, kidney or liver issues with CMP, hypothyroidism related edema with TSH, for proteinuria with urinalysis and check BNP to evaluate for heart failure but no shortness of breath orthopnea-I think this is less likely  # Diabetes- peak a1c of 10.7 in November 2022  off meds S: Medication:Metformin 1000 mg extended release (only takes 500mg  on 05/12/22- afterwards increase to 1000mg ) -now off Rybelsus 14 mg and appetite improved Lab Results  Component Value Date   HGBA1C 7.1 (H) 08/18/2022   HGBA1C 9.4 (H) 05/12/2022   HGBA1C 8.0 (A) 01/28/2022   A/P: May have some increase in A1c off of Rybelsus but we simply could not tolerate the weight loss that seem to be occurring on medicine-continue with metformin alone for now and recheck at next visit in November  #History of lacunar infarct #hyperlipidemia #aortic atherosclerosis - LDL goal 70 S: Medication:Pravastatin 40 mg--> rosuvastatin 10 mg, aspirin 81 mg Lab Results  Component Value Date   CHOL 238  (H) 07/19/2021   HDL 61.50 07/19/2021   LDLCALC 143 (H) 07/19/2021   LDLDIRECT 93 01/28/2022   TRIG 169.0 (H) 07/19/2021   CHOLHDL 4 07/19/2021  A/P: With history of lacunar infarct prefer LDL under 70-now on rosuvastatin 10 mg-update lipid panel and prefer LDL under 70 as noted-discussed if triglycerides over 400 may be forced to do fasting labs in the future    #Vitamin D deficiency S: Medication: Previously treated with high-dose vitamin D 50,000 units for 12 weeks in November Last vitamin D Lab Results  Component Value Date   VD25OH 40 01/28/2022  A/P: Hopefully stable on multivitamin-update vitamin D today-retreat if needed  Recommended follow up: Return in about 4 months (around 01/23/2023) for followup or sooner if needed.Schedule b4 you leave. Future Appointments  Date Time Provider Department Center  02/27/2023  3:00 PM Elwyn Reach [facility] None  04/11/2023  3:15 PM [facility] ANNUAL WELLNESS VISIT 1 [facility]  05/30/2023  3:15 PM [facility] None  05/30/2023  3:45 PM Si Gaul, MD [facility] None   Lab/Order associations: NOT fasting   ICD-10-CM   1. Type 2 diabetes mellitus with hyperglycemia, without long-term current use of insulin (HCC)  E11.65 Microalbumin / creatinine urine ratio    Comprehensive metabolic panel    CBC with Differential/Platelet    2. Pedal edema  R60.0 Urinalysis, Routine w reflex microscopic    TSH    B Nat Peptide    3. Hyperlipidemia, unspecified hyperlipidemia type  E78.5 Lipid panel    4. Vitamin D deficiency  E55.9 VITAMIN D 25 Hydroxy (Vit-D Deficiency, Fractures)      No orders of the defined types were placed in this encounter.   Return precautions advised.  Tana Conch, MD

## 2022-09-23 NOTE — Patient Instructions (Addendum)
Please stop by lab before you go If you have mychart- we will send your results within 3 business days of [place] receiving them.  If you do not have mychart- we will call you about results within 5 business days of [place] receiving them.  *please also note that you will see labs on mychart as soon as they post. I will later go in and write notes on them- will say "notes from Dr. Durene Cal"   Let me know if swelling worsens anymore- lets try cutting back on salt in diet - particularly the chips  Recommended follow up: Return in about 4 months (around 01/23/2023) for followup or sooner if needed.Schedule b4 you leave.

## 2022-09-27 ENCOUNTER — Telehealth (HOSPITAL_COMMUNITY): Payer: Self-pay | Admitting: Family Medicine

## 2022-09-27 NOTE — Telephone Encounter (Signed)
Called and spoke with pt husband and labs reviewed, he will call Dr. Sunday Spillers office to get echo scheduled.

## 2022-09-27 NOTE — Telephone Encounter (Signed)
I called to schedule patient the ordered echocardiogram. Patient declined to schedule. Order will be removed from the active echo WQ. Thank you

## 2022-09-27 NOTE — Telephone Encounter (Signed)
She has some memory loss-can you please check with the husband before removing?

## 2022-10-05 ENCOUNTER — Telehealth: Payer: Self-pay | Admitting: Family Medicine

## 2022-10-05 NOTE — Telephone Encounter (Signed)
Caller is patient's spouse. Caller states that since patient's stroke she has become increasingly frustrated with things. States these episodes have intensified and been occurring more often. Caller wanted to know if PCP recommended any medication that could help.

## 2022-10-12 NOTE — Telephone Encounter (Signed)
Patient's spouse called back for an update. States that he is trying his best to be a good caregiver but he is concerned she is heading for a downward spiral. Please Advise.

## 2022-10-13 MED ORDER — SERTRALINE HCL 25 MG PO TABS: 25.0000 mg | ORAL_TABLET | Freq: Every day | ORAL | 3 refills | Status: DC

## 2022-10-13 NOTE — Telephone Encounter (Signed)
There is an antidepressant/antianxiety medicine called sertraline/Zoloft 25 mg that we could try but it could take up to 6 weeks to see the full benefit and may increase anxiety short-term for first 2 weeks.  Can cause thoughts of self-harm-if she developed any of these would be to seek care immediately.  Please let me know if interested.  Can also upset stomach at the beginning but I think she would get used to this and since her weight has stabilized I think we could try

## 2022-10-13 NOTE — Telephone Encounter (Signed)
Perfect- please either call or message them to encourage 6 week follow up to check in on results- we may need to adjust dose at that time depending on how she's doing

## 2022-10-13 NOTE — Telephone Encounter (Signed)
See below

## 2022-10-13 NOTE — Telephone Encounter (Signed)
Called and spoke with pt husband and below message given, Rx sent to pharmacy.

## 2022-10-14 NOTE — Telephone Encounter (Signed)
Please call and schedule 6 week f/u per Dr. Durene Cal.

## 2022-10-17 NOTE — Telephone Encounter (Signed)
Patient has been scheduled for 8/27 @ 1:20 pm.

## 2022-10-26 ENCOUNTER — Encounter (INDEPENDENT_AMBULATORY_CARE_PROVIDER_SITE_OTHER): Payer: Self-pay

## 2022-10-27 ENCOUNTER — Telehealth: Payer: Self-pay

## 2022-10-27 NOTE — Telephone Encounter (Signed)
Patient reported to me stopping medication in 2023 and preference not to restart this-if she wants to restart this was discussed at her visit on August 27 or sooner if needed

## 2022-10-27 NOTE — Telephone Encounter (Signed)
Pharmacy sent request over for Escitalopram Ocalate 5mg , med not on current med list. Original prescriber on the Rx is Junell Koberlein, ok to Rx?

## 2022-11-02 ENCOUNTER — Other Ambulatory Visit: Payer: Self-pay | Admitting: Family Medicine

## 2022-11-22 ENCOUNTER — Ambulatory Visit: Payer: Medicare PPO | Admitting: Family Medicine

## 2022-11-29 ENCOUNTER — Telehealth: Payer: Self-pay | Admitting: Family Medicine

## 2022-11-29 NOTE — Telephone Encounter (Signed)
Advised Home Care & to see provider within 24 hrs  Patient Name First: Gabrielle Benes Last: Robinson Gender: Female DOB: 05-13-1940 Age: 82 Y 1 M 6 D Return Phone Number: [phone] (Primary), [phone] (Secondary) Address: City/ State/ Zip: [place]  [address] Client [facility] Night - Human resources officer [facility] Night Provider Tana Conch- MD Contact Type Call Who Is Calling Patient / Member / Family / Caregiver Call Type Triage / Clinical Relationship To Patient Self Return Phone Number [phone] (Primary) Chief Complaint Diarrhea Reason for Call Symptomatic / Request for Health Information Initial Comment Caller states she is having trouble with diarrhea. She is having to run to the bathroom. No additional symptoms Translation No Nurse Assessment Nurse: Logan Bores, RN, Cala Bradford Date/Time (Eastern Time): 11/25/2022 10:18:27 PM Confirm and document reason for call. If symptomatic, describe symptoms. ---Caller states that she has diarrhea. States that she has taken some Kayo pectate. States that it started this morning. Denies fever. No vomiting. Does the patient have any new or worsening symptoms? ---Yes Will a triage be completed? ---Yes Related visit to physician within the last 2 weeks? ---No Does the PT have any chronic conditions? (i.e. diabetes, asthma, this includes High risk factors for pregnancy, etc.) ---No Is this a behavioral health or substance abuse call? ---No Guidelines Guideline Title Affirmed Question Affirmed Notes Nurse Date/Time (Eastern Time) Diarrhea [1] MODERATE diarrhea (e.g., 4-6 times / day more than normal) AND [2] age > 42 years Logan Bores, RN, Cala Bradford 11/25/2022 10:20:35 PM Disp. Time Lamount Cohen Time) Disposition Final User 11/25/2022 9:52:32 PM Send to Clinical Alanda Amass, RN, Felecia Jan. Time Lamount Cohen Time) Disposition Final User 11/25/2022 9:56:24 PM Attempt made - no message  left Franne Grip 11/25/2022 10:11:38 PM Send To RN Personal Merilynn Finland, RN, Verlon Au 11/25/2022 10:27:24 PM See PCP within 24 Hours Yes Logan Bores, RN, Cala Bradford Final Disposition 11/25/2022 10:27:24 PM See PCP within 24 Hours Yes Logan Bores, RN, Anders Simmonds Disagree/Comply Comply Caller Understands Yes PreDisposition InappropriateToAsk Care Advice Given Per Guideline SEE PCP WITHIN 24 HOURS: FLUID THERAPY DURING SEVERE DIARRHEA: * Drink more fluids, at least 8 to 10 cups daily. One cup equals 8 oz (240 ml). * WATER: For mild to moderate diarrhea, water is often the best liquid to drink. You should also eat some salty foods (e.g., potato chips, pretzels, saltine crackers). This is important to make sure you are getting enough salt, sugars, and fluids to meet your body's needs. * SPORTS DRINKS: You can also drink half-strength sports drinks (e.g., Gatorade, Powerade) to help treat and prevent dehydration. Mix the sports drink half and half with water. * AVOID caffeinated beverages. Reason: Caffeine is mildly dehydrating. * AVOID carbonated soft drinks (soda) as these can make your diarrhea worse. * Begin with boiled starches / cereals (e.g., potatoes, rice, noodles, wheat, oats) with a small amount of salt. * You can also eat bananas, yogurt, crackers, soup. CALL BACK IF: * You become worse CARE ADVICE given per Diarrhea (Adult) guideline. Comments User: Laurel Dimmer, RN Date/Time Lamount Cohen Time): 11/25/2022 9:56:10 PM phone picked up, then disconnected, did not hear anyone Referrals GO TO FACILITY UNDECIDED

## 2022-12-11 ENCOUNTER — Other Ambulatory Visit: Payer: Self-pay | Admitting: Family Medicine

## 2022-12-13 NOTE — Progress Notes (Signed)
Encounter close

## 2022-12-17 ENCOUNTER — Other Ambulatory Visit: Payer: Self-pay | Admitting: Family Medicine

## 2023-02-09 ENCOUNTER — Ambulatory Visit: Payer: Medicare PPO | Admitting: Family Medicine

## 2023-02-09 ENCOUNTER — Encounter: Payer: Self-pay | Admitting: Family Medicine

## 2023-02-09 VITALS — BP 127/72 | HR 92 | Temp 97.6°F | Ht 61.0 in | Wt 102.2 lb

## 2023-02-09 DIAGNOSIS — E1165 Type 2 diabetes mellitus with hyperglycemia: Secondary | ICD-10-CM

## 2023-02-09 DIAGNOSIS — Z23 Encounter for immunization: Secondary | ICD-10-CM | POA: Diagnosis not present

## 2023-02-09 DIAGNOSIS — Z7984 Long term (current) use of oral hypoglycemic drugs: Secondary | ICD-10-CM | POA: Diagnosis not present

## 2023-02-09 DIAGNOSIS — F0390 Unspecified dementia without behavioral disturbance: Secondary | ICD-10-CM | POA: Diagnosis not present

## 2023-02-09 DIAGNOSIS — I499 Cardiac arrhythmia, unspecified: Secondary | ICD-10-CM | POA: Diagnosis not present

## 2023-02-09 DIAGNOSIS — F325 Major depressive disorder, single episode, in full remission: Secondary | ICD-10-CM

## 2023-02-09 DIAGNOSIS — E785 Hyperlipidemia, unspecified: Secondary | ICD-10-CM

## 2023-02-09 MED ORDER — METFORMIN HCL ER 500 MG PO TB24: 1000.0000 mg | ORAL_TABLET | Freq: Two times a day (BID) | ORAL | 3 refills | Status: DC

## 2023-02-09 NOTE — Progress Notes (Signed)
Phone [phone] In person visit   Subjective:   Gabrielle Robinson is a 82 y.o. year old very pleasant female patient who presents for/with See problem oriented charting Chief Complaint  Patient presents with   Diabetes    Pt here for f.u - no concerns    Depression   Hyperlipidemia   Past Medical History-  Patient Active Problem List   Diagnosis Date Noted   History of cerebral infarction 05/12/2022    Priority: High   Major neurocognitive disorder (HCC) 05/12/2022    Priority: High   Diabetes mellitus (HCC) 04/01/2013    Priority: High   Vitamin D deficiency 10/22/2021    Priority: Medium    Atherosclerosis of aorta (HCC) 07/27/2021    Priority: Medium    History of melanoma 07/27/2021    Priority: Medium    Major depressive disorder with single episode, in full remission (HCC) 05/28/2015    Priority: Medium    Hyperlipemia 01/05/2009    Priority: Medium    Bilateral shoulder pain 05/28/2015    Priority: Low    Medications- reviewed and updated Current Outpatient Medications  Medication Sig Dispense Refill   aspirin EC 81 MG tablet Take 81 mg by mouth daily. Swallow whole.     Blood Glucose Monitoring Suppl DEVI 1 each by Does not apply route in the morning, at noon, and at bedtime. May substitute to any manufacturer covered by patient's insurance. 1 each 0   Cyanocobalamin (VITAMIN B 12 PO) Take by mouth.     donepezil (ARICEPT) 5 MG tablet Take 5 mg by mouth at bedtime. Prior didn't tolerate but tolerating now per neurology     Multiple Vitamin (MULTIVITAMIN) capsule Take 1 capsule by mouth daily.     Polyethyl Glycol-Propyl Glycol (SYSTANE ULTRA OP) Place 1 drop into both eyes daily as needed (dry eyes).     rosuvastatin (CRESTOR) 10 MG tablet Take 1 tablet (10 mg total) by mouth daily. 90 tablet 3   metFORMIN (GLUCOPHAGE-XR) 500 MG 24 hr tablet Take 2 tablets (1,000 mg total) by mouth 2 (two) times daily with a meal. 360 tablet 3   No current facility-administered  medications for this visit.     Objective:  BP 127/72   Pulse 92   Temp 97.6 F (36.4 C)   Ht 5\' 1"  (1.549 m)   Wt 102 lb 3.2 oz (46.4 kg)   SpO2 97%   BMI 19.31 kg/m  Gen: NAD, resting comfortably CV: irregular heart rate due to rate variation no murmurs rubs or gallops Lungs: CTAB no crackles, wheeze, rhonchi Abdomen: soft/nontender/nondistended/normal bowel sounds. No rebound or guarding.  Ext: no edema Skin: warm, dry   EKG: Initial EKG with poor lead placement-patient was trying to leave to go to the bathroom suddenly when EKG was run.  I do not see an obvious PVC on this.  Appears to be in sinus rhythm with a rate 65, unable to interpret axis, normal intervals, no hypertrophy, RSR in V1, no ST or T wave changes  We also did a subsequent EKG which did not load into epic but is going to be scanned in.  This showed sinus rhythm with rate variation from just under 100-75, normal axis, normal intervals, no hypertrophy, RSR in V1, with no ST or T wave changes      Assessment and Plan   #major neurocognitive disorder- sees neurology Warner S:Medication: donepazil 5 mg -January 2024 MRI-noted prior lacunar infarct-now on aspirin -ongoing loss of short  term memory A/P: noted with continued mild worsening- continue with donepazil for now- keep follow up next month with neurology for their opinion    # Diabetes- peak a1c of 10.7 in November 2022 off meds S: Medication:Metformin 1000 mg extended release  twice daily  -prior significant weight loss/gastrointestinal distress on Rybelsus 14 mg- now off- that may have also caused decrease in a1c CBGs- not checking Exercise and diet- some limited exercising/stretching Lab Results  Component Value Date   HGBA1C 7.1 (H) 08/18/2022   HGBA1C 9.4 (H) 05/12/2022   HGBA1C 8.0 (A) 01/28/2022   A/P: hopefully stable- update a1c today. Continue current meds for now  - I am concerned based on last blood sugar level that this could be  trending up off Rybelsus- also consider [place]- she is not intereste din shots -takes B12 while on metformin   #History of lacunar infarct #hyperlipidemia #aortic atherosclerosis - LDL goal 70 S: Medication:Pravastatin 40 mg--> rosuvastatin 10 mg, aspirin 81 mg Lab Results  Component Value Date   CHOL 153 09/23/2022   HDL 68.90 09/23/2022   LDLCALC 64 09/23/2022   LDLDIRECT 93 01/28/2022   TRIG 103.0 09/23/2022   CHOLHDL 2 09/23/2022  A/P: cholesterol at goal for both history of stroke and aortic atherosclerosis - continue current medications - not yet due for repeat   # anxiety/Depression- didn't tolerate Zoloft due to diarrhea, had previously come off Lexapro 5 mg- husband reports mild anxiety and some frustrations with memory  loss- with 2 prior medication trials will hold off for now  -phq9 under 5 today  #prior swelling in legs- cutting down on salt helped  # Irregular heart rate-patient seem to have rate variation during exam of her heart.  Using echo stethoscope heart rate was evaluated and suggested possible atrial fibrillation.  An EKG was performed and showed sinus rhythm with rate variation-no further workup is needed  Recommended follow up: Return in about 4 months (around 06/09/2023) for physical or sooner if needed.Schedule b4 you leave. Future Appointments  Date Time Provider Department Center  02/27/2023  3:00 PM Elwyn Reach [facility] None  04/11/2023  3:30 PM [facility] ANNUAL WELLNESS VISIT 1 [facility]  05/30/2023  3:15 PM [facility] None  05/30/2023  3:45 PM Si Gaul, MD [facility] None    Lab/Order associations:   ICD-10-CM   1. Type 2 diabetes mellitus with hyperglycemia, without long-term current use of insulin (HCC)  E11.65 Comprehensive metabolic panel    Hemoglobin A1c    CANCELED: Hemoglobin A1c    CANCELED: Comprehensive metabolic panel    2. Hyperlipidemia, unspecified hyperlipidemia type  E78.5 TSH    CANCELED: TSH     3. Irregular heart beat  I49.9 EKG 12-Lead    TSH    CANCELED: TSH    4. Need for influenza vaccination  Z23 Flu Vaccine Trivalent High Dose (Fluad)    5. Major neurocognitive disorder (HCC)  F03.90     6. Major depressive disorder with single episode, in full remission (HCC)  F32.5       Meds ordered this encounter  Medications   metFORMIN (GLUCOPHAGE-XR) 500 MG 24 hr tablet    Sig: Take 2 tablets (1,000 mg total) by mouth 2 (two) times daily with a meal.    Dispense:  360 tablet    Refill:  3    Return precautions advised.  Tana Conch, MD

## 2023-02-09 NOTE — Patient Instructions (Addendum)
Schedule lab visit on your way out or call back  I would always drive with a support person for your safety to help avoid getting lost or in case of an accident (as could significantly increase confusion). I apologize for any frustration with this- but we really care about keeping you safe. If you begin to have any issues with driving please you or support person let us know and stop driving until further notice  Recommended follow up: Return in about 4 months (around 06/09/2023) for physical or sooner if needed.Schedule b4 you leave.

## 2023-02-27 ENCOUNTER — Ambulatory Visit: Payer: Medicare PPO | Admitting: Physician Assistant

## 2023-02-27 NOTE — Progress Notes (Incomplete)
Assessment/Plan:   Dementia likely due to ***  Gabrielle Robinson is a very pleasant 82 y.o. RH female with a history off hypertension, hyperlipidemia, DM2, history of depression, vitamin D deficiency, history of melanoma, B12 deficiency, chronic shoulder pain, history of CVA per imaging seen today in follow up for memory loss. Patient is currently on donepezil 10 mg daily***.  She continues to perform her ADLs independently, continues to drive    Follow up in   months. Continue donepezil 10 mg daily, side effects discussed*** Alcohol cessation recommended Monitor driving Recommend good control of her cardiovascular risk factors Continue to control mood as per PCP     Subjective:    This patient is accompanied in the office by *** who supplements the history.  Previous records as well as any outside records available were reviewed prior to todays visit. Patient was last seen on 08/23/2022 with MMSE 19/30.***   Any changes in memory since last visit? ".  Husband reports that her memory may be worse, with some confusion about dates and times, difficulty remembering recent conversations and names.  The concept of time appears to be affected.  She no longer reads as frequently as before.*** repeats oneself?  Endorsed Disoriented when walking into a room?  Patient denies ***  Leaving objects?  May misplace things but not in unusual places***  Wandering behavior?  denies   Any personality changes since last visit?  denies   Any worsening depression?:  Denies.   Hallucinations or paranoia?  Denies.   Seizures? denies    Any sleep changes?  Denies vivid dreams, REM behavior or sleepwalking   Sleep apnea?   Denies.   Any hygiene concerns? Denies.  Independent of bathing and dressing?  Endorsed  Does the patient needs help with medications?  Husband is in charge *** Who is in charge of the finances?  Husband is in charge   *** Any changes in appetite?  denies ***   Patient have trouble  swallowing? Denies.   Does the patient cook? No Any headaches?   denies   Chronic back pain  denies   Ambulates with difficulty? Denies.  *** Recent falls or head injuries? denies     Unilateral weakness, numbness or tingling? denies   Any tremors?  Denies   Any anosmia?  Denies   Any incontinence of urine?  Endorsed***, wears diapers Any bowel dysfunction?  She has intermittent diarrhea. Patient lives with her husband*** Does the patient drive? No longer drives *** Alcohol?   MRI brain from January 2024 personally reviewed was remarkable for mild chronic small vessel ischemic changes within the cerebral white matter, chronic lacunar infarct within the left basal ganglia, moderate to advanced generalized cerebral atrophy, mild cerebellar atrophy   Initial visit on 05/17/2022 How long did patient have memory difficulties? She denies any memory issues. Husband noticed some slight memory issues 2 months ago, confusion about dates and times and some difficulty remembering recent conversations and people names  repeats oneself? Denies  Disoriented when walking into a room?  Patient denies Leaving objects in unusual places?  Endorsed, but not in unusual places   Wandering behavior? denies   Any personality changes since last visit? "She gets discouraged when solving an issue, but nothing radical" Any worsening depression?:  denies   Hallucinations or paranoia?  denies   Seizures?   denies    Any sleep changes?  Denies  vivid dreams, REM behavior or sleepwalking   Sleep apnea? denies  Any hygiene concerns?  denies   Independent of bathing and dressing?  Endorsed  Does the patient need help with medications? Husband is in charge   Who is in charge of the finances? Husband  is in charge     Any changes in appetite?  Both eat 2 meals, "not too hungry"    Patient have trouble swallowing?  Denies   Does the patient cook? No  Any kitchen accidents such as leaving the stove on? Patient denies    Any headaches?  Denies   Chronic back pain  Denies  "I never had pain" Ambulates with difficulty?   Denies   Recent falls or head injuries?  denies     Unilateral weakness, numbness or tingling?  denies   Any tremors?  denies   Any anosmia?  denies   Any incontinence of urine?  denies   Any bowel dysfunction?  Intermittent diarrhea.  Patient lives  with her husband  History of heavy alcohol intake?glass or two of wine at night, "maybe a little more but not overindulgent" History of heavy tobacco use?   denies   Family history of dementia?  Denies  Dose patient drive? She had one episode where she" missed a road or two"   Retired Runner, broadcasting/film/video. College degree     MRI of the brain January 2024 personally reviewed was remarkable for chronic lacunar infarct within the left basal ganglia and mild chronic small vessel ischemic changes, as well as generalized cerebral atrophy and mild cerebellar atrophy   Pertinent labs, A1c on November 2023 was 8, total cholesterol 238, LDL 143, triglycerides 169.  January 2024 TSH 2.89, B12 1057    PREVIOUS MEDICATIONS:   CURRENT MEDICATIONS:  Outpatient Encounter Medications as of 02/27/2023  Medication Sig   aspirin EC 81 MG tablet Take 81 mg by mouth daily. Swallow whole.   Blood Glucose Monitoring Suppl DEVI 1 each by Does not apply route in the morning, at noon, and at bedtime. May substitute to any manufacturer covered by patient's insurance.   Cyanocobalamin (VITAMIN B 12 PO) Take by mouth.   donepezil (ARICEPT) 5 MG tablet Take 5 mg by mouth at bedtime. Prior didn't tolerate but tolerating now per neurology   metFORMIN (GLUCOPHAGE-XR) 500 MG 24 hr tablet Take 2 tablets (1,000 mg total) by mouth 2 (two) times daily with a meal.   Multiple Vitamin (MULTIVITAMIN) capsule Take 1 capsule by mouth daily.   Polyethyl Glycol-Propyl Glycol (SYSTANE ULTRA OP) Place 1 drop into both eyes daily as needed (dry eyes).   rosuvastatin (CRESTOR) 10 MG tablet Take 1  tablet (10 mg total) by mouth daily.   No facility-administered encounter medications on file as of 02/27/2023.       08/23/2022    4:00 PM 10/25/2021    8:42 AM 12/05/2017    4:29 PM  MMSE - Mini Mental State Exam  Not completed:   --   Orientation to time 4 5   Orientation to Place 5 5   Registration 3 3   Attention/ Calculation 0 5   Recall 0 2   Language- name 2 objects 1 2   Language- repeat 1 1   Language- follow 3 step command 3 3   Language- read & follow direction 1 1   Write a sentence 1 1   Copy design 0 1   Total score 19 29      Significant value      05/18/2022   10:00 AM 05/16/2022  7:00 PM  Montreal Cognitive Assessment   Visuospatial/ Executive (0/5) 2 1  Naming (0/3) 3 3  Attention: Read list of digits (0/2) 2 2  Attention: Read list of letters (0/1) 1 1  Attention: Serial 7 subtraction starting at 100 (0/3) 1 1  Language: Repeat phrase (0/2) 1 1  Language : Fluency (0/1) 1 1  Abstraction (0/2) 1 1  Delayed Recall (0/5) 1 1  Orientation (0/6) 5 6  Total 18 18  Adjusted Score (based on education) 18 18    Objective:     PHYSICAL EXAMINATION:    VITALS:  There were no vitals filed for this visit.  GEN:  The patient appears stated age and is in NAD. HEENT:  Normocephalic, atraumatic.   Neurological examination:  General: NAD, well-groomed, appears stated age. Orientation: The patient is alert. Oriented to person, place and date Cranial nerves: There is good facial symmetry.The speech is fluent and clear. No aphasia or dysarthria. Fund of knowledge is appropriate. Recent and remote memory are impaired. Attention and concentration are reduced.  Able to name objects and repeat phrases.  Hearing is intact to conversational tone. *** Sensation: Sensation is intact to light touch throughout Motor: Strength is at least antigravity x4. DTR's 2/4 in UE/LE     Movement examination: Tone: There is normal tone in the UE/LE Abnormal movements:  no  tremor.  No myoclonus.  No asterixis.   Coordination:  There is no decremation with RAM's. Normal finger to nose  Gait and Station: The patient has no*** difficulty arising out of a deep-seated chair without the use of the hands. The patient's stride length is good.  Gait is cautious and narrow.    Thank you for allowing [place] the opportunity to participate in the care of this nice patient. Please do not hesitate to contact us for any questions or concerns.   Total time spent on today's visit was *** minutes dedicated to this patient today, preparing to see patient, examining the patient, ordering tests and/or medications and counseling the patient, documenting clinical information in the EHR or other health record, independently interpreting results and communicating results to the patient/family, discussing treatment and goals, answering patient's questions and coordinating care.  Cc:  Shelva Majestic, MD  Marlowe Kays 02/27/2023 7:28 AM

## 2023-03-13 ENCOUNTER — Telehealth: Payer: Self-pay | Admitting: Family Medicine

## 2023-03-13 DIAGNOSIS — L821 Other seborrheic keratosis: Secondary | ICD-10-CM | POA: Diagnosis not present

## 2023-03-13 DIAGNOSIS — Z85828 Personal history of other malignant neoplasm of skin: Secondary | ICD-10-CM | POA: Diagnosis not present

## 2023-03-13 DIAGNOSIS — Z85820 Personal history of malignant melanoma of skin: Secondary | ICD-10-CM | POA: Diagnosis not present

## 2023-03-13 DIAGNOSIS — D692 Other nonthrombocytopenic purpura: Secondary | ICD-10-CM | POA: Diagnosis not present

## 2023-03-13 DIAGNOSIS — L812 Freckles: Secondary | ICD-10-CM | POA: Diagnosis not present

## 2023-03-13 NOTE — Telephone Encounter (Signed)
Advised to see provider within 2 wks. Will call to schedule pt.    Patient Name First: Gabrielle MANIFOLD Last: Robinson Gender: Female DOB: Feb 06, 1941 Age: 82 Y 4 M 20 D Return Phone Number: [phone] (Primary), [phone] (Secondary) Address: City/ State/ Zip: [place]  [address] Client [facility] Night - Human resources officer [facility] Night Provider Tana Conch- MD Contact Type Call Who Is Calling Patient / Member / Family / Caregiver Call Type Triage / Clinical Caller Name Marrietta Demario Relationship To Patient Spouse Return Phone Number [phone] (Primary) Chief Complaint Diarrhea Reason for Call Symptomatic / Request for Health Information Initial Comment Caller states his wife has diarrhea and is becoming frustrated because the probiotics are no longer working. Pt has urinated in the last 8 hours and would like advice on what to do. Translation No Nurse Assessment Nurse: Raynelle Highland, RN, Misty Stanley Date/Time (Eastern Time): 03/12/2023 2:02:03 PM Confirm and document reason for call. If symptomatic, describe symptoms. ---Caller states is wife is having diarrhea x "a couple of months",caller states about 2 diarrhea stools per day and pt is a Type 2 Diabetic on Metformin. Pt has dementia. Denies vomiting or fever, denies cough or dyspnea. Does the patient have any new or worsening symptoms? ---Yes Will a triage be completed? ---Yes Related visit to physician within the last 2 weeks? ---No Does the PT have any chronic conditions? (i.e. diabetes, asthma, this includes High risk factors for pregnancy, etc.) ---Yes List chronic conditions. ---Dementia, HTN, cholesterol Is this a behavioral health or substance abuse call? ---No Guidelines Guideline Title Affirmed Question Affirmed Notes Nurse Date/Time (Eastern Time) Diarrhea Diarrhea is a chronic symptom (recurrent Leos, RN, Misty Stanley 03/12/2023  2:09:11 PM Guidelines Guideline Title Affirmed Question Affirmed Notes Nurse Date/Time (Eastern Time) or ongoing AND present > 4 weeks) Disp. Time Lamount Cohen Time) Disposition Final User 03/12/2023 2:00:39 PM Send To Nurse Maudry Mayhew, RN, Santina Evans 03/12/2023 2:16:18 PM See PCP within 2 Weeks Yes Raynelle Highland, RN, Misty Stanley Final Disposition 03/12/2023 2:16:18 PM See PCP within 2 Weeks Yes Leos, RN, Gracy Racer Disagree/Comply Comply Caller Understands Yes PreDisposition Call Doctor Care Advice Given Per Guideline SEE PCP WITHIN 2 WEEKS: * You need to be seen for this ongoing problem within the next 2 weeks. FLUID THERAPY DURING MILD TO MODERATE DIARRHEA: * Drink more fluids, at least 8 to 10 cups daily. One cup equals 8 oz (240 ml). * WATER: For mild to moderate diarrhea, water is often the best liquid to drink. You should also eat some salty foods (such as potato chips, pretzels, saltine crackers). This is important to make sure you are getting enough salt, sugars, and fluids to meet your body's needs. * SPORTS DRINKS: You can also drink half-strength sports drinks (such as Gatorade, Powerade) to help treat and prevent dehydration. Mix the sports drink half and half with water. FOOD AND NUTRITION DURING MILD TO MODERATE DIARRHEA: * Maintaining some food intake during episodes of diarrhea is important. * Begin with boiled starches / cereals (such as potatoes, rice, noodles, wheat, oats) with a small amount of salt to taste. * You can also eat bananas, yogurt, crackers, and soup. * Eat smaller meals and snacks more often during the day rather than 3 larger meals. CALL BACK IF: * Signs of dehydration occur (such as no urine over 12 hours, very dry mouth, lightheaded) * Diarrhea lasts over 7 days * You become worse CARE ADVICE given per Diarrhea (Adult) guideline. Comments  User: Lura Em, RN Date/Time Lamount Cohen Time): 03/12/2023 2:10:00 PM Caller thinks the diarrhea started after increasing  Metformin to 4 a day from 2 a day Referrals REFERRED TO PCP OFFICE

## 2023-03-16 ENCOUNTER — Ambulatory Visit: Payer: Medicare PPO | Admitting: Physician Assistant

## 2023-03-16 ENCOUNTER — Ambulatory Visit: Payer: Medicare PPO | Admitting: Family Medicine

## 2023-03-16 ENCOUNTER — Encounter: Payer: Self-pay | Admitting: Physician Assistant

## 2023-03-16 VITALS — BP 138/80 | HR 75 | Ht 61.0 in | Wt 106.0 lb

## 2023-03-16 DIAGNOSIS — F0390 Unspecified dementia without behavioral disturbance: Secondary | ICD-10-CM | POA: Diagnosis not present

## 2023-03-16 NOTE — Patient Instructions (Signed)
It was a pleasure to see you today at our office.   Recommendations:    Follow up in 6 months  Continue Donepezil 5  mg daily if tolerated, consider increasing to 10 mg a day for better coverage  Continue replenishing B12 and D Monitor the sugars   Whom to call:  Memory  decline, memory medications: Call our office [phone]   For psychiatric meds, mood meds: Please have your primary care physician manage these medications.     For assessment of decision of mental capacity and competency:  Call Dr. Erick Blinks, geriatric psychiatrist at [phone]  For guidance in geriatric dementia issues please call [facility] [phone]     If you have any severe symptoms of a stroke, or other severe issues such as confusion,severe chills or fever, etc call 911 or go to the ER as you may need to be evaluated further   Feel free to visit Facebook page " [email or web address]" for tips of how to care for people with memory problems.      RECOMMENDATIONS FOR ALL PATIENTS WITH MEMORY PROBLEMS: 1. Continue to exercise (Recommend 30 minutes of walking everyday, or 3 hours every week) 2. Increase social interactions - continue going to [place] and enjoy social gatherings with friends and family 3. Eat healthy, avoid fried foods and eat more fruits and vegetables 4. Maintain adequate blood pressure, blood sugar, and blood cholesterol level. Reducing the risk of stroke and cardiovascular disease also helps promoting better memory. 5. Avoid stressful situations. Live a simple life and avoid aggravations. Organize your time and prepare for the next day in anticipation. 6. Sleep well, avoid any interruptions of sleep and avoid any distractions in the bedroom that may interfere with adequate sleep quality 7. Avoid sugar, avoid sweets as there is a strong link between excessive sugar intake, diabetes, and cognitive impairment We discussed the Mediterranean diet, which has been shown to help patients  reduce the risk of progressive memory disorders and reduces cardiovascular risk. This includes eating fish, eat fruits and green leafy vegetables, nuts like almonds and hazelnuts, walnuts, and also use olive oil. Avoid fast foods and fried foods as much as possible. Avoid sweets and sugar as sugar use has been linked to worsening of memory function.  There is always a concern of gradual progression of memory problems. If this is the case, then we may need to adjust level of care according to patient needs. Support, both to the patient and caregiver, should then be put into place.       FALL PRECAUTIONS: Be cautious when walking. Scan the area for obstacles that may increase the risk of trips and falls. When getting up in the mornings, sit up at the edge of the bed for a few minutes before getting out of bed. Consider elevating the bed at the head end to avoid drop of blood pressure when getting up. Walk always in a well-lit room (use night lights in the walls). Avoid area rugs or power cords from appliances in the middle of the walkways. Use a walker or a cane if necessary and consider physical therapy for balance exercise. Get your eyesight checked regularly.  FINANCIAL OVERSIGHT: Supervision, especially oversight when making financial decisions or transactions is also recommended.  HOME SAFETY: Consider the safety of the kitchen when operating appliances like stoves, microwave oven, and blender. Consider having supervision and share cooking responsibilities until no longer able to participate in those. Accidents with firearms and other hazards in  the house should be identified and addressed as well.   ABILITY TO BE LEFT ALONE: If patient is unable to contact 911 operator, consider using LifeLine, or when the need is there, arrange for someone to stay with patients. Smoking is a fire hazard, consider supervision or cessation. Risk of wandering should be assessed by caregiver and if detected at any  point, supervision and safe proof recommendations should be instituted.  MEDICATION SUPERVISION: Inability to self-administer medication needs to be constantly addressed. Implement a mechanism to ensure safe administration of the medications.   DRIVING: Regarding driving, in patients with progressive memory problems, driving will be impaired. We advise to have someone else do the driving if trouble finding directions or if minor accidents are reported. Independent driving assessment is available to determine safety of driving.   If you are interested in the driving assessment, you can contact the following:  [facility] [employer] [facility] in [place] [phone]  [facility] [phone]  [facility] [phone] or [phone]    Mediterranean Diet A Mediterranean diet refers to food and lifestyle choices that are based on the traditions of countries located on the [employer] [place]. This way of eating has been shown to help prevent certain conditions and improve outcomes for people who have chronic diseases, like kidney disease and heart disease. What are tips for following this plan? Lifestyle  Cook and eat meals together with your family, when possible. Drink enough fluid to keep your urine clear or pale yellow. Be physically active every day. This includes: Aerobic exercise like running or swimming. Leisure activities like gardening, walking, or housework. Get 7-8 hours of sleep each night. If recommended by your health care provider, drink red wine in moderation. This means 1 glass a day for nonpregnant women and 2 glasses a day for men. A glass of wine equals 5 oz (150 mL). Reading food labels  Check the serving size of packaged foods. For foods such as rice and pasta, the serving size refers to the amount of cooked product, not dry. Check the total fat in packaged foods. Avoid foods that have saturated fat or trans  fats. Check the ingredients list for added sugars, such as corn syrup. Shopping  At the grocery store, buy most of your food from the areas near the walls of the store. This includes: Fresh fruits and vegetables (produce). Grains, beans, nuts, and seeds. Some of these may be available in unpackaged forms or large amounts (in bulk). Fresh seafood. Poultry and eggs. Low-fat dairy products. Buy whole ingredients instead of prepackaged foods. Buy fresh fruits and vegetables in-season from local farmers markets. Buy frozen fruits and vegetables in resealable bags. If you do not have access to quality fresh seafood, buy precooked frozen shrimp or canned fish, such as tuna, salmon, or sardines. Buy small amounts of raw or cooked vegetables, salads, or olives from the deli or salad bar at your store. Stock your pantry so you always have certain foods on hand, such as olive oil, canned tuna, canned tomatoes, rice, pasta, and beans. Cooking  Cook foods with extra-virgin olive oil instead of using butter or other vegetable oils. Have meat as a side dish, and have vegetables or grains as your main dish. This means having meat in small portions or adding small amounts of meat to foods like pasta or stew. Use beans or vegetables instead of meat in common dishes like chili or lasagna. Experiment with different cooking methods. Try roasting or  broiling vegetables instead of steaming or sauteing them. Add frozen vegetables to soups, stews, pasta, or rice. Add nuts or seeds for added healthy fat at each meal. You can add these to yogurt, salads, or vegetable dishes. Marinate fish or vegetables using olive oil, lemon juice, garlic, and fresh herbs. Meal planning  Plan to eat 1 vegetarian meal one day each week. Try to work up to 2 vegetarian meals, if possible. Eat seafood 2 or more times a week. Have healthy snacks readily available, such as: Vegetable sticks with hummus. Greek yogurt. Fruit and nut  trail mix. Eat balanced meals throughout the week. This includes: Fruit: 2-3 servings a day Vegetables: 4-5 servings a day Low-fat dairy: 2 servings a day Fish, poultry, or lean meat: 1 serving a day Beans and legumes: 2 or more servings a week Nuts and seeds: 1-2 servings a day Whole grains: 6-8 servings a day Extra-virgin olive oil: 3-4 servings a day Limit red meat and sweets to only a few servings a month What are my food choices? Mediterranean diet Recommended Grains: Whole-grain pasta. Brown rice. Bulgar wheat. Polenta. Couscous. Whole-wheat bread. Orpah Cobb. Vegetables: Artichokes. Beets. Broccoli. Cabbage. Carrots. Eggplant. Green beans. Chard. Kale. Spinach. Onions. Leeks. Peas. Squash. Tomatoes. Peppers. Radishes. Fruits: Apples. Apricots. Avocado. Berries. Bananas. Cherries. Dates. Figs. Grapes. Lemons. Melon. Oranges. Peaches. Plums. Pomegranate. Meats and other protein foods: Beans. Almonds. Sunflower seeds. Pine nuts. Peanuts. Cod. Salmon. Scallops. Shrimp. Tuna. Tilapia. Clams. Oysters. Eggs. Dairy: Low-fat milk. Cheese. Greek yogurt. Beverages: Water. Red wine. Herbal tea. Fats and oils: Extra virgin olive oil. Avocado oil. Grape seed oil. Sweets and desserts: [place] yogurt with honey. Baked apples. Poached pears. Trail mix. Seasoning and other foods: Basil. Cilantro. Coriander. Cumin. Mint. Parsley. Sage. Rosemary. Tarragon. Garlic. Oregano. Thyme. Pepper. Balsalmic vinegar. Tahini. Hummus. Tomato sauce. Olives. Mushrooms. Limit these Grains: Prepackaged pasta or rice dishes. Prepackaged cereal with added sugar. Vegetables: Deep fried potatoes (french fries). Fruits: Fruit canned in syrup. Meats and other protein foods: Beef. Pork. Lamb. Poultry with skin. Hot dogs. Tomasa Blase. Dairy: Ice cream. Sour cream. Whole milk. Beverages: Juice. Sugar-sweetened soft drinks. Beer. Liquor and spirits. Fats and oils: Butter. Canola oil. Vegetable oil. Beef fat (tallow).  Lard. Sweets and desserts: Cookies. Cakes. Pies. Candy. Seasoning and other foods: Mayonnaise. Premade sauces and marinades. The items listed may not be a complete list. Talk with your dietitian about what dietary choices are right for you. Summary The Mediterranean diet includes both food and lifestyle choices. Eat a variety of fresh fruits and vegetables, beans, nuts, seeds, and whole grains. Limit the amount of red meat and sweets that you eat. Talk with your health care provider about whether it is safe for you to drink red wine in moderation. This means 1 glass a day for nonpregnant women and 2 glasses a day for men. A glass of wine equals 5 oz (150 mL). This information is not intended to replace advice given to you by your health care provider. Make sure you discuss any questions you have with your health care provider. Document Released: 11/05/2015 Document Revised: 12/08/2015 Document Reviewed: 11/05/2015 Elsevier Interactive Patient Education  2017 [employer].     [address]

## 2023-03-16 NOTE — Progress Notes (Addendum)
Assessment/Plan:   Dementia likely due to   Gabrielle Robinson is a very pleasant 82 y.o.LH female with a history of hypertension, hyperlipidemia, DM2, history of depression, vitamin D deficiency, history of melanoma, B12 deficiency, chronic shoulder pain, history of CVA per imaging seen today in follow up for memory loss. Patient is currently on donepezil 10 mg daily. MMSE 22/30. She continues to consume alcohol but has reduced the amount. Patient is able to participate on his ADLs and to drive without difficulties      Follow up in 6  months. Recommend increasing donepezil to 10 mg daily as per PCP when issues with lose stools (not related to this medicine) improve.  Recommend good control of her cardiovascular risk factors Continue B12 and D supplements  Alcohol cessation recommended. Monitor driving. Continue to control mood as per PCP     Subjective:    This patient is accompanied in the office by her husband who supplements the history.  Previous records as well as any outside records available were reviewed prior to todays visit. Patient was last seen on 08/23/2022 with MMSE 19/30.    Any changes in memory since last visit? " About the same".  There is mild cognitive decline in her short-term memory.  LTM is good but her concept of time as before may be affected.  "She has difficulty remembering recent conversations and names, dates, times, sometimes she cannot and works around it ".  She does not read as she did before. repeats oneself?  Endorsed, especially with appointments. Disoriented when walking into a room?  Patient denies    Leaving objects?  May misplace things but not in unusual places   Wandering behavior?  denies   Any personality changes since last visit?  As before, she can become frustrated and defensive at times, due to memory issues. Any worsening depression?:  Denies.   Hallucinations or paranoia?  Denies.   Seizures? denies    Any sleep changes?  Sleeps well.   Denies vivid dreams, REM behavior or sleepwalking   Sleep apnea?   Denies.   Any hygiene concerns? Needs reminder  Independent of bathing and dressing?  Endorsed  Does the patient needs help with medications?  Husband is in charge   Who is in charge of the finances?  Husband is in charge     Any changes in appetite?  denies.  She always eats 2 meals a day.  She likes ice cream.    Patient have trouble swallowing? Denies.   Does the patient cook? No Any headaches?   denies   Chronic back pain  denies   Ambulates with difficulty? Denies.    Recent falls or head injuries? denies     Unilateral weakness, numbness or tingling? denies   Any tremors?  Denies   Any anosmia?  Denies   Any incontinence of urine?  Denies  any bowel dysfunction?  Intermittently, she has diarrhea.     Patient lives with her husband  Does the patient drive?  She continues to drive, has not done much lately .  Denies any issues.  Continues to drink a glass of wine at night.  MRI brain from January 2024 personally reviewed was remarkable for mild chronic small vessel ischemic changes within the cerebral white matter, chronic lacunar infarct within the left basal ganglia, moderate to advanced generalized cerebral atrophy, mild cerebellar atrophy    Initial visit on 05/17/2022 How long did patient have memory difficulties? She denies any  memory issues. Husband noticed some slight memory issues 2 months ago, confusion about dates and times and some difficulty remembering recent conversations and people names  repeats oneself? Denies  Disoriented when walking into a room?  Patient denies Leaving objects in unusual places?  Endorsed, but not in unusual places   Wandering behavior? denies   Any personality changes since last visit? "She gets discouraged when solving an issue, but nothing radical" Any worsening depression?:  denies   Hallucinations or paranoia?  denies   Seizures?   denies    Any sleep changes?  Denies   vivid dreams, REM behavior or sleepwalking   Sleep apnea? denies   Any hygiene concerns?  denies   Independent of bathing and dressing?  Endorsed  Does the patient need help with medications? Husband is in charge   Who is in charge of the finances? Husband  is in charge     Any changes in appetite?  Both eat 2 meals, "not too hungry"    Patient have trouble swallowing?  Denies   Does the patient cook? No  Any kitchen accidents such as leaving the stove on? Patient denies   Any headaches?  Denies   Chronic back pain  Denies  "I never had pain" Ambulates with difficulty?   Denies   Recent falls or head injuries?  denies     Unilateral weakness, numbness or tingling?  denies   Any tremors?  denies   Any anosmia?  denies   Any incontinence of urine?  denies   Any bowel dysfunction?  Intermittent diarrhea.  Patient lives  with her husband  History of heavy alcohol intake?glass or two of wine at night, "maybe a little more but not overindulgent" History of heavy tobacco use?   denies   Family history of dementia?  Denies  Dose patient drive? She had one episode where she" missed a road or two"   Retired Runner, broadcasting/film/video. College degree   PREVIOUS MEDICATIONS:   CURRENT MEDICATIONS:  Outpatient Encounter Medications as of 03/16/2023  Medication Sig   aspirin EC 81 MG tablet Take 81 mg by mouth daily. Swallow whole.   Blood Glucose Monitoring Suppl DEVI 1 each by Does not apply route in the morning, at noon, and at bedtime. May substitute to any manufacturer covered by patient's insurance.   Cyanocobalamin (VITAMIN B 12 PO) Take by mouth.   donepezil (ARICEPT) 5 MG tablet Take 5 mg by mouth at bedtime. Prior didn't tolerate but tolerating now per neurology   metFORMIN (GLUCOPHAGE-XR) 500 MG 24 hr tablet Take 2 tablets (1,000 mg total) by mouth 2 (two) times daily with a meal.   Multiple Vitamin (MULTIVITAMIN) capsule Take 1 capsule by mouth daily.   Polyethyl Glycol-Propyl Glycol (SYSTANE ULTRA  OP) Place 1 drop into both eyes daily as needed (dry eyes).   rosuvastatin (CRESTOR) 10 MG tablet Take 1 tablet (10 mg total) by mouth daily.   No facility-administered encounter medications on file as of 03/16/2023.       08/23/2022    4:00 PM 10/25/2021    8:42 AM 12/05/2017    4:29 PM  MMSE - Mini Mental State Exam  Not completed:   --   Orientation to time 4 5   Orientation to Place 5 5   Registration 3 3   Attention/ Calculation 0 5   Recall 0 2   Language- name 2 objects 1 2   Language- repeat 1 1   Language- follow 3 step  command 3 3   Language- read & follow direction 1 1   Write a sentence 1 1   Copy design 0 1   Total score 19 29      Significant value      05/18/2022   10:00 AM 05/16/2022    7:00 PM  Montreal Cognitive Assessment   Visuospatial/ Executive (0/5) 2 1  Naming (0/3) 3 3  Attention: Read list of digits (0/2) 2 2  Attention: Read list of letters (0/1) 1 1  Attention: Serial 7 subtraction starting at 100 (0/3) 1 1  Language: Repeat phrase (0/2) 1 1  Language : Fluency (0/1) 1 1  Abstraction (0/2) 1 1  Delayed Recall (0/5) 1 1  Orientation (0/6) 5 6  Total 18 18  Adjusted Score (based on education) 18 18    Objective:     PHYSICAL EXAMINATION:    VITALS:   Vitals:   03/16/23 1519  BP: 138/80  Pulse: 75  SpO2: 99%  Weight: 106 lb (48.1 kg)  Height: 5\' 1"  (1.549 m)    GEN:  The patient appears stated age and is in NAD. HEENT:  Normocephalic, atraumatic.   Neurological examination:  General: NAD, well-groomed, appears stated age. Orientation: The patient is alert. Oriented to person, place and not to date Cranial nerves: There is good facial symmetry.The speech is fluent and clear. No aphasia or dysarthria. Fund of knowledge is appropriate. Recent and remote memory are impaired. Attention and concentration are reduced.  Able to name objects and repeat phrases.  Hearing is intact to conversational tone.  Sensation: Sensation is intact  to light touch throughout Motor: Strength is at least antigravity x4. DTR's 2/4 in UE/LE     Movement examination: Tone: There is normal tone in the UE/LE Abnormal movements:  no tremor.  No myoclonus.  No asterixis.   Coordination:  There is no decremation with RAM's. Normal finger to nose  Gait and Station: The patient has no difficulty arising out of a deep-seated chair without the use of the hands. The patient's stride length is good.  Gait is cautious and narrow.    Thank you for allowing [place] the opportunity to participate in the care of this nice patient. Please do not hesitate to contact us for any questions or concerns.   Total time spent on today's visit was 36 minutes dedicated to this patient today, preparing to see patient, examining the patient, ordering tests and/or medications and counseling the patient, documenting clinical information in the EHR or other health record, independently interpreting results and communicating results to the patient/family, discussing treatment and goals, answering patient's questions and coordinating care.  Cc:  Shelva Majestic, MD  Marlowe Kays 03/16/2023 3:36 PM

## 2023-03-28 ENCOUNTER — Ambulatory Visit: Payer: Medicare PPO | Admitting: Family Medicine

## 2023-03-28 ENCOUNTER — Other Ambulatory Visit: Payer: Medicare PPO

## 2023-03-28 DIAGNOSIS — I499 Cardiac arrhythmia, unspecified: Secondary | ICD-10-CM | POA: Diagnosis not present

## 2023-03-28 DIAGNOSIS — E785 Hyperlipidemia, unspecified: Secondary | ICD-10-CM | POA: Diagnosis not present

## 2023-03-28 DIAGNOSIS — E1165 Type 2 diabetes mellitus with hyperglycemia: Secondary | ICD-10-CM | POA: Diagnosis not present

## 2023-03-28 LAB — COMPREHENSIVE METABOLIC PANEL
ALT: 28 U/L (ref 0–35)
AST: 30 U/L (ref 0–37)
Albumin: 4.6 g/dL (ref 3.5–5.2)
Alkaline Phosphatase: 71 U/L (ref 39–117)
BUN: 14 mg/dL (ref 6–23)
CO2: 27 meq/L (ref 19–32)
Calcium: 10.3 mg/dL (ref 8.4–10.5)
Chloride: 101 meq/L (ref 96–112)
Creatinine, Ser: 0.89 mg/dL (ref 0.40–1.20)
GFR: 59.95 mL/min — ABNORMAL LOW (ref >60.00)
Glucose, Bld: 306 mg/dL — ABNORMAL HIGH (ref 70–99)
Potassium: 5.8 meq/L — ABNORMAL HIGH (ref 3.5–5.1)
Sodium: 141 meq/L (ref 135–145)
Total Bilirubin: 0.3 mg/dL (ref 0.2–1.2)
Total Protein: 7.4 g/dL (ref 6.0–8.3)

## 2023-03-28 LAB — HEMOGLOBIN A1C: Hgb A1c MFr Bld: 8.8 % — ABNORMAL HIGH (ref 4.6–6.5)

## 2023-03-28 LAB — TSH: TSH: 2.82 u[IU]/mL (ref 0.35–5.50)

## 2023-04-03 ENCOUNTER — Other Ambulatory Visit: Payer: Self-pay

## 2023-04-03 DIAGNOSIS — E1165 Type 2 diabetes mellitus with hyperglycemia: Secondary | ICD-10-CM

## 2023-04-03 MED ORDER — SITAGLIPTIN PHOSPHATE 50 MG PO TABS: 50.0000 mg | ORAL_TABLET | Freq: Every day | ORAL | 5 refills | Status: DC

## 2023-04-06 ENCOUNTER — Other Ambulatory Visit: Payer: Self-pay

## 2023-04-07 ENCOUNTER — Telehealth: Payer: Self-pay

## 2023-04-07 NOTE — Telephone Encounter (Signed)
 This is a duplicate, I have sent to Dr. Katrinka for review.  Copied from CRM [id number]. Topic: Clinical - Lab/Test Results >> Apr 06, 2023  4:14 PM Rosina BIRCH wrote: Reason for CRM: Pt husband called wanting to know if the lab results for the pt can be resent to mychart

## 2023-04-07 NOTE — Telephone Encounter (Signed)
 I attempted to rerelease- see if they can see- if not- then mailing is reasonable

## 2023-04-07 NOTE — Telephone Encounter (Signed)
 See below, if not able to re release I can place a copy in the mail for pt.  Copied from CRM [id number]. Topic: Clinical - Lab/Test Results >> Apr 06, 2023  4:20 PM Franky GRADE wrote: Reason for CRM: Patient's husband is calling because he is unable to see patient's results on MyChart and was hoping it can be resent. >> Apr 06, 2023  4:26 PM Franky GRADE wrote: They also want to verify if they should follow up with Dr. Katrinka within a week?

## 2023-04-07 NOTE — Telephone Encounter (Signed)
 Called pt and phone just continued to ring, labs printed and mailed to pt since I was unable to reach them.

## 2023-04-11 ENCOUNTER — Ambulatory Visit (INDEPENDENT_AMBULATORY_CARE_PROVIDER_SITE_OTHER): Payer: Medicare PPO

## 2023-04-11 VITALS — Wt 106.0 lb

## 2023-04-11 DIAGNOSIS — Z0000 Encounter for general adult medical examination without abnormal findings: Secondary | ICD-10-CM

## 2023-04-11 NOTE — Patient Instructions (Signed)
 Ms. Egler , Thank you for taking time to come for your Medicare Wellness Visit. I appreciate your ongoing commitment to your health goals. Please review the following plan we discussed and let me know if I can assist you in the future.   Referrals/Orders/Follow-Ups/Clinician Recommendations: Each day, aim for 6 glasses of water, plenty of protein in your diet and try to get up and walk/ stretch every hour for 5-10 minutes at a time.    This is a list of the screening recommended for you and due dates:  Health Maintenance  Topic Date Due   Zoster (Shingles) Vaccine (1 of 2) 05/12/2023*   Eye exam for diabetics  06/02/2023   Complete foot exam   08/18/2023   Yearly kidney health urinalysis for diabetes  09/23/2023   Hemoglobin A1C  09/25/2023   Yearly kidney function blood test for diabetes  03/27/2024   Medicare Annual Wellness Visit  04/10/2024   DTaP/Tdap/Td vaccine (4 - Tdap) 12/06/2027   Pneumonia Vaccine  Completed   Flu Shot  Completed   DEXA scan (bone density measurement)  Completed   HPV Vaccine  Aged Out   COVID-19 Vaccine  Discontinued  *Topic was postponed. The date shown is not the original due date.    Advanced directives: (Copy Requested) Please bring a copy of your health care power of attorney and living will to the office to be added to your chart at your convenience.  Next Medicare Annual Wellness Visit scheduled for next year: Yes

## 2023-04-11 NOTE — Progress Notes (Signed)
 Subjective:   Gabrielle Robinson is a 83 y.o. female who presents for Medicare Annual (Subsequent) preventive examination.  Visit Complete: Virtual I connected with  Gabrielle Robinson on 04/11/23 by a video and audio enabled telemedicine application and verified that I am speaking with the correct person using two identifiers.  Patient Location: Home  Provider Location: Office/Clinic  I discussed the limitations of evaluation and management by telemedicine. The patient expressed understanding and agreed to proceed.  Vital Signs: Because this visit was a virtual/telehealth visit, some criteria may be missing or patient reported. Any vitals not documented were not able to be obtained and vitals that have been documented are patient reported.   Cardiac Risk Factors include: advanced age (>20men, >14 women);diabetes mellitus;dyslipidemia     Objective:    Today's Vitals   04/11/23 1457  Weight: 106 lb (48.1 kg)   Body mass index is 20.03 kg/m.     04/11/2023    3:06 PM 03/16/2023    3:20 PM 08/23/2022    2:24 PM 05/16/2022    1:19 PM 04/05/2022    3:40 PM 02/25/2021    2:28 PM 02/25/2020    2:11 PM  Advanced Directives  Does Patient Have a Medical Advance Directive? Yes Yes No No No No No  Type of Estate Agent of [place];Living will Healthcare Power of [place];Living will       Copy of Healthcare Power of Attorney in Chart? No - copy requested        Would patient like information on creating a medical advance directive?   No - Patient declined  No - Patient declined No - Patient declined No - Patient declined    Current Medications (verified) Outpatient Encounter Medications as of 04/11/2023  Medication Sig   aspirin EC 81 MG tablet Take 81 mg by mouth daily. Swallow whole.   Blood Glucose Monitoring Suppl DEVI 1 each by Does not apply route in the morning, at noon, and at bedtime. May substitute to any manufacturer covered by patient's insurance.    Cyanocobalamin  (VITAMIN B 12 PO) Take by mouth.   donepezil  (ARICEPT ) 5 MG tablet Take 5 mg by mouth at bedtime. Prior didn't tolerate but tolerating now per neurology   metFORMIN  (GLUCOPHAGE -XR) 500 MG 24 hr tablet Take 2 tablets (1,000 mg total) by mouth 2 (two) times daily with a meal.   Multiple Vitamin (MULTIVITAMIN) capsule Take 1 capsule by mouth daily.   Polyethyl Glycol-Propyl Glycol (SYSTANE ULTRA OP) Place 1 drop into both eyes daily as needed (dry eyes).   rosuvastatin  (CRESTOR ) 10 MG tablet Take 1 tablet (10 mg total) by mouth daily.   sertraline  (ZOLOFT ) 25 MG tablet Take 25 mg by mouth daily.   sitaGLIPtin  (JANUVIA ) 50 MG tablet Take 1 tablet (50 mg total) by mouth daily.   No facility-administered encounter medications on file as of 04/11/2023.    Allergies (verified) Donepezil    History: Past Medical History:  Diagnosis Date   Cancer (HCC)    Left Hip Melanoma   Diabetes mellitus without complication (HCC)    Hyperlipemia 01/05/2009   Qualifier: Diagnosis of  By: [name], Norleen BRAVO    Major depressive disorder with single episode, in full remission (HCC) 05/28/2015   Past Surgical History:  Procedure Laterality Date   MELANOMA EXCISION Left 02/08/2018   Procedure: WIDE LOCAL EXCISION WITH ADVANCEMENT FLAP CLOSURE OF LEFT HIP MELANOMA ERAS PATHWAY;  Surgeon: Aron Shoulders, MD;  Location: [facility];  Service:  General;  Laterality: Left;   SENTINEL NODE BIOPSY Left 02/08/2018   Procedure: LEFT INGUINAL SENTINEL LYMPH NODE BIOPSY;  Surgeon: Aron Shoulders, MD;  Location: [facility] OR;  Service: General;  Laterality: Left;   Family History  Problem Relation Age of Onset   Diabetes Mellitus II Mother        died of old age right at 10   Diabetes Mellitus II Father        died of old age in 10s   Melanoma Sister    Breast cancer Maternal Grandmother    Renal cancer Maternal Grandfather    Social History   Socioeconomic History   Marital status: Married    Spouse name: Not  on file   Number of children: Not on file   Years of education: Not on file   Highest education level: Bachelor's degree (e.g., BA, AB, BS)  Occupational History   Not on file  Tobacco Use   Smoking status: Never   Smokeless tobacco: Never  Vaping Use   Vaping status: Never Used  Substance and Sexual Activity   Alcohol use: Yes    Alcohol/week: 14.0 standard drinks of alcohol    Types: 14 Glasses of wine per week    Comment: encouraged max 1 per day   Drug use: No   Sexual activity: Yes    Partners: Male    Comment: husband  Other Topics Concern   Not on file  Social History Narrative   Home Situation: lives with husband. 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)       Hobbies: reading, enjoys time of at home      Left handed   Lives with husband   Social Drivers of Corporate Investment Banker Strain: Low Risk  (04/11/2023)   Overall Financial Resource Strain (CARDIA)    Difficulty of Paying Living Expenses: Not hard at all  Food Insecurity: No Food Insecurity (04/11/2023)   Hunger Vital Sign    Worried About Running Out of Food in the Last Year: Never true    Ran Out of Food in the Last Year: Never true  Transportation Needs: No Transportation Needs (04/11/2023)   PRAPARE - Administrator, Civil Service (Medical): No    Lack of Transportation (Non-Medical): No  Physical Activity: Insufficiently Active (04/11/2023)   Exercise Vital Sign    Days of Exercise per Week: 2 days    Minutes of Exercise per Session: 10 min  Stress: No Stress Concern Present (04/11/2023)   [employer] of Occupational Health - Occupational Stress Questionnaire    Feeling of Stress : Only a little  Social Connections: Moderately Isolated (04/11/2023)   Social Connection and Isolation Panel [NHANES]    Frequency of Communication with Friends and Family: More than three times a week     Frequency of Social Gatherings with Friends and Family: Once a week    Attends Religious Services: Never    Database Administrator or Organizations: No    Attends Engineer, Structural: Never    Marital Status: Married    Tobacco Counseling Counseling given: Not Answered   Clinical Intake:  Pre-visit preparation completed: Yes  Pain : No/denies pain     BMI - recorded: 20.03 Nutritional Status: BMI of 19-24  Normal Nutritional Risks: None Diabetes: Yes CBG done?: No Did pt. bring in  CBG monitor from home?: No  How often do you need to have someone help you when you read instructions, pamphlets, or other written materials from your doctor or pharmacy?: 1 - Never  Interpreter Needed?: No  Information entered by :: Ellouise Haws, LPN   Activities of Daily Living    04/11/2023    3:02 PM  In your present state of health, do you have any difficulty performing the following activities:  Hearing? 1  Comment hearing aids  Vision? 0  Difficulty concentrating or making decisions? 1  Comment short term memory  Walking or climbing stairs? 1  Dressing or bathing? 0  Doing errands, shopping? 0  Preparing Food and eating ? Y  Comment husband bill  Using the Toilet? N  In the past six months, have you accidently leaked urine? N  Do you have problems with loss of bowel control? N  Managing your Medications? Y  Managing your Finances? N  Housekeeping or managing your Housekeeping? Y    Patient Care Team: Katrinka Garnette KIDD, MD as PCP - General (Family Medicine) Amadeo Windell SAILOR, MD (Inactive) as Consulting Physician (Oncology) Joshua Blamer, MD as Consulting Physician (Dermatology) Wertman, Sara E, PA-C (Neurology)  Indicate any recent Medical Services you may have received from other than [facility] providers in the past year (date may be approximate).     Assessment:   This is a routine wellness examination for Jacquilyn.  Hearing/Vision screen Hearing Screening -  Comments:: Pt has hearing aids  Vision Screening - Comments:: Pt follows up with [facility] ophthalmology    Goals Addressed             This Visit's Progress    Patient Stated       Eat healthy        Depression Screen    04/11/2023    3:08 PM 02/09/2023    3:45 PM 09/23/2022    1:05 PM 06/08/2022   11:20 AM 05/12/2022    4:09 PM 04/05/2022    3:39 PM 01/28/2022    3:29 PM  PHQ 2/9 Scores  PHQ - 2 Score 1 1 0 2 0 0 0  PHQ- 9 Score  2 0 6 0  0    Fall Risk    04/11/2023    3:07 PM 03/16/2023    3:20 PM 08/23/2022    2:24 PM 05/16/2022    1:19 PM 05/12/2022    4:09 PM  Fall Risk   Falls in the past year? 0 0 0 0 0  Number falls in past yr: 0 0 0 0 0  Injury with Fall? 0 0 0 0 0  Risk for fall due to : Impaired mobility    No Fall Risks  Follow up Falls prevention discussed Falls evaluation completed Falls evaluation completed Falls evaluation completed Falls evaluation completed    MEDICARE RISK AT HOME: Medicare Risk at Home Any stairs in or around the home?: Yes If so, are there any without handrails?: No Home free of loose throw rugs in walkways, pet beds, electrical cords, etc?: Yes Adequate lighting in your home to reduce risk of falls?: Yes Life alert?: No Use of a cane, walker or w/c?: No Grab bars in the bathroom?: Yes Shower chair or bench in shower?: No Elevated toilet seat or a handicapped toilet?: No  TIMED UP AND GO:  Was the test performed?  No    Cognitive Function:unable to complete at this time     04/11/2023  3:09 PM 03/16/2023    3:00 PM 08/23/2022    4:00 PM 10/25/2021    8:42 AM 12/05/2017    4:29 PM  MMSE - Mini Mental State Exam  Not completed: Unable to complete    --   Orientation to time  3 4 5    Orientation to Place  5 5 5    Registration  3 3 3    Attention/ Calculation  2 0 5   Recall  1 0 2   Language- name 2 objects  2 1 2    Language- repeat  1 1 1    Language- follow 3 step command  3 3 3    Language- read & follow  direction  1 1 1    Write a sentence  1 1 1    Copy design  0 0 1   Total score  22 19 29       Significant value      05/18/2022   10:00 AM 05/16/2022    7:00 PM  Montreal Cognitive Assessment   Visuospatial/ Executive (0/5) 2 1  Naming (0/3) 3 3  Attention: Read list of digits (0/2) 2 2  Attention: Read list of letters (0/1) 1 1  Attention: Serial 7 subtraction starting at 100 (0/3) 1 1  Language: Repeat phrase (0/2) 1 1  Language : Fluency (0/1) 1 1  Abstraction (0/2) 1 1  Delayed Recall (0/5) 1 1  Orientation (0/6) 5 6  Total 18 18  Adjusted Score (based on education) 18 18      04/05/2022    3:43 PM 02/25/2021    2:24 PM  6CIT Screen  What Year? 0 points 0 points  What month? 0 points 0 points  What time? 0 points 0 points  Count back from 20 0 points 0 points  Months in reverse 4 points 0 points  Repeat phrase 2 points 2 points  Total Score 6 points 2 points    Immunizations Immunization History  Administered Date(s) Administered   Fluad Quad(high Dose 65+) 02/05/2019, 12/10/2021   Fluad Trivalent(High Dose 65+) 02/09/2023   Influenza Split 12/26/2012   Influenza Whole 01/09/2008, 01/01/2010   Influenza, High Dose Seasonal PF 03/10/2014, 12/29/2014, 03/07/2016, 12/05/2017   PFIZER(Purple Top)SARS-COV-2 Vaccination 04/22/2019, 05/10/2019   Pneumococcal Conjugate-13 09/17/2014   Pneumococcal Polysaccharide-23 01/01/2010   Td 10/29/2007, 12/05/2017, 12/05/2017    TDAP status: Up to date  Flu Vaccine status: Up to date  Pneumococcal vaccine status: Up to date  Covid-19 vaccine status: Information provided on how to obtain vaccines.   Qualifies for Shingles Vaccine? Yes   Zostavax completed No   Shingrix Completed?: No.    Education has been provided regarding the importance of this vaccine. Patient has been advised to call insurance company to determine out of pocket expense if they have not yet received this vaccine. Advised may also receive vaccine at local  pharmacy or Health Dept. Verbalized acceptance and understanding.  Screening Tests Health Maintenance  Topic Date Due   Zoster Vaccines- Shingrix (1 of 2) 05/12/2023 (Originally 10/20/1959)   OPHTHALMOLOGY EXAM  06/02/2023   FOOT EXAM  08/18/2023   Diabetic kidney evaluation - Urine ACR  09/23/2023   HEMOGLOBIN A1C  09/25/2023   Diabetic kidney evaluation - eGFR measurement  03/27/2024   Medicare Annual Wellness (AWV)  04/10/2024   DTaP/Tdap/Td (4 - Tdap) 12/06/2027   Pneumonia Vaccine 74+ Years old  Completed   INFLUENZA VACCINE  Completed   DEXA SCAN  Completed   HPV VACCINES  Aged Out   COVID-19 Vaccine  Discontinued    Health Maintenance  There are no preventive care reminders to display for this patient.   Colorectal cancer screening: No longer required.   Mammogram status: No longer required due to age .  Bone Density status: Completed 01/07/16. Results reflect: Bone density results: OSTEOPOROSIS. Repeat every 2 years.   Additional Screening:   Vision Screening: Recommended annual ophthalmology exams for early detection of glaucoma and other disorders of the eye. Is the patient up to date with their annual eye exam?  Yes  Who is the provider or what is the name of the office in which the patient attends annual eye exams? [facility] ophthalmology  If pt is not established with a provider, would they like to be referred to a provider to establish care? No .   Dental Screening: Recommended annual dental exams for proper oral hygiene  Diabetic Foot Exam: Diabetic Foot Exam: Completed 08/18/22  Community Resource Referral / Chronic Care Management: CRR required this visit?  No   CCM required this visit?  No     Plan:     I have personally reviewed and noted the following in the patient's chart:   Medical and social history Use of alcohol, tobacco or illicit drugs  Current medications and supplements including opioid prescriptions. Patient is not currently  taking opioid prescriptions. Functional ability and status Nutritional status Physical activity Advanced directives List of other physicians Hospitalizations, surgeries, and ER visits in previous 12 months Vitals Screenings to include cognitive, depression, and falls Referrals and appointments  In addition, I have reviewed and discussed with patient certain preventive protocols, quality metrics, and best practice recommendations. A written personalized care plan for preventive services as well as general preventive health recommendations were provided to patient.     Ellouise VEAR Haws, LPN   8/85/7974   After Visit Summary: (MyChart) Due to this being a telephonic visit, the after visit summary with patients personalized plan was offered to patient via MyChart   Nurse Notes: pt unable to do cognition related to resting husband completed AWV

## 2023-04-25 ENCOUNTER — Telehealth: Payer: Self-pay | Admitting: Family Medicine

## 2023-04-25 NOTE — Telephone Encounter (Signed)
Contact the [facility] [phone]) [phone]  [email or web address] They should be able to give more direct information - most of the time people qualify if there are spots open- sometimes there is a fee on sliding scale depending on income

## 2023-04-25 NOTE — Telephone Encounter (Signed)
Do either of you know much about meels on wheels or the process?

## 2023-04-25 NOTE — Telephone Encounter (Unsigned)
Copied from CRM [id number]. Topic: General - Other >> Apr 25, 2023 12:48 PM Taleah C wrote: Reason for CRM: pt's husband called and wanted to ask what would the patient need to do qualify for Meals on Wheels. He stated that he did not want to explain why she is requesting for it and prefers for someone in the clinic to give him a call. Please call back and advise.

## 2023-04-26 NOTE — Telephone Encounter (Signed)
Called and spoke with pt husband and below message given.

## 2023-04-30 ENCOUNTER — Other Ambulatory Visit: Payer: Self-pay | Admitting: Family Medicine

## 2023-05-08 ENCOUNTER — Telehealth: Payer: Self-pay

## 2023-05-08 NOTE — Telephone Encounter (Signed)
 I do not see an obvious cause on her medications.  May be worth scheduling office visit.  He could also check her blood sugar and blood pressure and pulse at home and she feels like this and have that information available when he comes to the visit with her

## 2023-05-08 NOTE — Telephone Encounter (Signed)
 Copied from CRM [id number]. Topic: Clinical - Medical Advice >> May 05, 2023  3:08 PM Earnestine Goes B wrote: Reason for CRM: Pt's husband called to speak with Dr. Arlene Ben regarding wife health. He states he would like DR hunter to review pt's medication. Pt's seems very tired, and does not talk for long periods of time. Please Give MR. More a call at [phone], or [phone]  Please Advise   Message has been sent to provider to address

## 2023-05-09 NOTE — Telephone Encounter (Signed)
Spoke with patient's husband regarding recommendations.

## 2023-05-26 ENCOUNTER — Ambulatory Visit (HOSPITAL_COMMUNITY): Payer: Medicare PPO

## 2023-05-30 ENCOUNTER — Inpatient Hospital Stay (HOSPITAL_BASED_OUTPATIENT_CLINIC_OR_DEPARTMENT_OTHER): Payer: Medicare PPO | Admitting: Internal Medicine

## 2023-05-30 ENCOUNTER — Inpatient Hospital Stay: Payer: Medicare PPO | Attending: Internal Medicine

## 2023-05-30 VITALS — BP 143/72 | HR 71 | Temp 98.2°F | Resp 16 | Ht 61.0 in | Wt 101.6 lb

## 2023-05-30 DIAGNOSIS — R413 Other amnesia: Secondary | ICD-10-CM | POA: Diagnosis not present

## 2023-05-30 DIAGNOSIS — Z08 Encounter for follow-up examination after completed treatment for malignant neoplasm: Secondary | ICD-10-CM

## 2023-05-30 DIAGNOSIS — Z85820 Personal history of malignant melanoma of skin: Secondary | ICD-10-CM

## 2023-05-30 LAB — CBC WITH DIFFERENTIAL (CANCER CENTER ONLY)
Abs Immature Granulocytes: 0.02 10*3/uL (ref 0.00–0.07)
Basophils Absolute: 0.1 10*3/uL (ref 0.0–0.1)
Basophils Relative: 1 %
Eosinophils Absolute: 0.2 10*3/uL (ref 0.0–0.5)
Eosinophils Relative: 2 %
HCT: 37.4 % (ref 36.0–46.0)
Hemoglobin: 12.3 g/dL (ref 12.0–15.0)
Immature Granulocytes: 0 %
Lymphocytes Relative: 22 %
Lymphs Abs: 2.1 10*3/uL (ref 0.7–4.0)
MCH: 28.4 pg (ref 26.0–34.0)
MCHC: 32.9 g/dL (ref 30.0–36.0)
MCV: 86.4 fL (ref 80.0–100.0)
Monocytes Absolute: 0.9 10*3/uL (ref 0.1–1.0)
Monocytes Relative: 9 %
Neutro Abs: 6.3 10*3/uL (ref 1.7–7.7)
Neutrophils Relative %: 66 %
Platelet Count: 271 10*3/uL (ref 150–400)
RBC: 4.33 MIL/uL (ref 3.87–5.11)
RDW: 11.9 % (ref 11.5–15.5)
WBC Count: 9.6 10*3/uL (ref 4.0–10.5)
nRBC: 0 % (ref 0.0–0.2)

## 2023-05-30 LAB — CMP (CANCER CENTER ONLY)
ALT: 34 U/L (ref 0–44)
AST: 26 U/L (ref 15–41)
Albumin: 4.3 g/dL (ref 3.5–5.0)
Alkaline Phosphatase: 61 U/L (ref 38–126)
Anion gap: 6 (ref 5–15)
BUN: 12 mg/dL (ref 8–23)
CO2: 30 mmol/L (ref 22–32)
Calcium: 9.9 mg/dL (ref 8.9–10.3)
Chloride: 100 mmol/L (ref 98–111)
Creatinine: 0.7 mg/dL (ref 0.44–1.00)
GFR, Estimated: >60 mL/min (ref >60)
Glucose, Bld: 257 mg/dL — ABNORMAL HIGH (ref 70–99)
Potassium: 4.6 mmol/L (ref 3.5–5.1)
Sodium: 136 mmol/L (ref 135–145)
Total Bilirubin: 0.4 mg/dL (ref 0.0–1.2)
Total Protein: 7.1 g/dL (ref 6.5–8.1)

## 2023-05-30 NOTE — Progress Notes (Signed)
 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  She was found to have T2N1 disease.    PRIOR THERAPY: post wide excision and sentinel lymph node sampling completed and February 08, 2018.    CURRENT THERAPY: Active surveillance   INTERVAL HISTORY: Gabrielle Robinson 83 y.o. female returns to the clinic today for annual follow-up visit accompanied by her husband.Discussed the use of AI scribe software for clinical note transcription with the patient, who gave verbal consent to proceed.  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 She is accompanied by her husband.  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 No adjuvant treatment was administered post-surgery, and she has been under observation since then. She has been attending annual follow-up visits, including lab work and scans, for the past five years. She is scheduled for a CT scan of the chest, abdomen, and pelvis in two days and sees her dermatologist every six months, with the last visit occurring approximately six months ago.  Since her last visit a year ago, she feels 'great' with no new complaints or symptoms. No chest pain, breathing issues, nausea, vomiting, or diarrhea. However, she has unintentionally lost a few pounds since January. She experienced a few episodes of diarrhea, which she attributes to consuming chocolate, but these episodes have resolved.  Her husband notes that she has developed some short-term memory issues over the past year.       MEDICAL HISTORY: Past Medical History:  Diagnosis Date    Cancer [facility])    Left Hip Melanoma   Diabetes mellitus without complication (HCC)    Hyperlipemia 01/05/2009   Qualifier: Diagnosis of  By: Lovell Sheehan MD, Balinda Quails    Major depressive disorder with single episode, in full remission (HCC) 05/28/2015    ALLERGIES:  is allergic to donepezil.  MEDICATIONS:  Current Outpatient Medications  Medication Sig Dispense Refill   aspirin EC 81 MG tablet Take 81 mg by mouth daily. Swallow whole.     Blood Glucose Monitoring Suppl DEVI 1 each by Does not apply route in the morning, at noon, and at bedtime. May substitute to any manufacturer covered by patient's insurance. 1 each 0   Cyanocobalamin (VITAMIN B 12 PO) Take by mouth.     donepezil (ARICEPT) 5 MG tablet Take 5 mg by mouth at bedtime. Prior didn't tolerate but tolerating now per neurology     metFORMIN (GLUCOPHAGE-XR) 500 MG 24 hr tablet Take 2 tablets (1,000 mg total) by mouth 2 (two) times daily with a meal. 360 tablet 3   Multiple Vitamin (MULTIVITAMIN) capsule Take 1 capsule by mouth daily.     Polyethyl Glycol-Propyl Glycol (SYSTANE ULTRA OP) Place 1 drop into both eyes daily as needed (dry eyes).     rosuvastatin (CRESTOR) 10 MG tablet TAKE ONE TABLET BY MOUTH ONE TIME DAILY 90 tablet 0   sertraline (ZOLOFT) 25 MG tablet Take 25 mg by mouth daily.     sitaGLIPtin (JANUVIA) 50 MG tablet Take 1 tablet (50 mg total) by mouth daily. 30 tablet 5  No current facility-administered medications for this visit.    SURGICAL HISTORY:  Past Surgical History:  Procedure Laterality Date   MELANOMA EXCISION Left 02/08/2018   Procedure: WIDE LOCAL EXCISION WITH ADVANCEMENT FLAP CLOSURE OF LEFT HIP MELANOMA ERAS PATHWAY;  Surgeon: Almond Lint, MD;  Location: [facility] OR;  Service: General;  Laterality: Left;   SENTINEL NODE BIOPSY Left 02/08/2018   Procedure: LEFT INGUINAL SENTINEL LYMPH NODE BIOPSY;  Surgeon: Almond Lint, MD;  Location: [facility] OR;  Service: General;  Laterality: Left;    REVIEW OF SYSTEMS:   A comprehensive review of systems was negative except for: Constitutional: positive for fatigue Neurological: positive for memory problems   PHYSICAL EXAMINATION: General appearance: alert, cooperative, fatigued, and no distress Head: Normocephalic, without obvious abnormality, atraumatic Neck: no adenopathy, no JVD, supple, symmetrical, trachea midline, and thyroid not enlarged, symmetric, no tenderness/mass/nodules Lymph nodes: Cervical, supraclavicular, and axillary nodes normal. Resp: clear to auscultation bilaterally Back: symmetric, no curvature. ROM normal. No CVA tenderness. Cardio: regular rate and rhythm, S1, S2 normal, no murmur, click, rub or gallop GI: soft, non-tender; bowel sounds normal; no masses,  no organomegaly Extremities: extremities normal, atraumatic, no cyanosis or edema  ECOG PERFORMANCE STATUS: 1 - Symptomatic but completely ambulatory  Blood pressure (!) 143/72, pulse 71, temperature 98.2 F (36.8 C), temperature source Temporal, resp. rate 16, height 5\' 1"  (1.549 m), weight 101 lb 9.6 oz (46.1 kg), SpO2 99%.  LABORATORY DATA: Lab Results  Component Value Date   WBC 9.6 05/30/2023   HGB 12.3 05/30/2023   HCT 37.4 05/30/2023   MCV 86.4 05/30/2023   PLT 271 05/30/2023      Chemistry      Component Value Date/Time   NA 141 03/28/2023 1142   K 5.8 No hemolysis seen (H) 03/28/2023 1142   CL 101 03/28/2023 1142   CO2 27 03/28/2023 1142   BUN 14 03/28/2023 1142   CREATININE 0.89 03/28/2023 1142   CREATININE 0.90 01/28/2022 1631      Component Value Date/Time   CALCIUM 10.3 03/28/2023 1142   ALKPHOS 71 03/28/2023 1142   AST 30 03/28/2023 1142   AST 15 04/10/2020 1028   ALT 28 03/28/2023 1142   ALT 16 04/10/2020 1028   BILITOT 0.3 03/28/2023 1142   BILITOT 0.7 04/10/2020 1028       RADIOGRAPHIC STUDIES: No results found.  ASSESSMENT AND PLAN: This is a very pleasant 83 years old white female with Cutaneous melanoma of the left thigh diagnosed  in November 2019.  She was found to have T2N1 disease.  She is post wide excision and sentinel lymph node sampling completed and February 08, 2018.      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 Underwent wide excision and sentinel lymph node biopsy. No adjuvant treatment was administered post-surgery. She is currently asymptomatic with no new complaints or symptoms, although recent weight loss was noted without associated gastrointestinal symptoms. Continues dermatology follow-up every six months. A CT scan of the chest, abdomen, and pelvis is scheduled for June 01, 2023, after this visit, contrary to the usual protocol of having it before the visit. - Ensure completion of scheduled CT scan of the chest, abdomen, and pelvis on June 01, 2023 - Review CT scan results and contact if any abnormalities are found - Continue annual follow-up with lab work and scans - Maintain dermatology follow-up every six months  Short-term  memory loss Reported development of short-term memory loss within the past year. No indication that this affects current management or follow-up plans. Husband is present to assist with discussions and ensure understanding. - Monitor memory status and provide support as needed   The patient was advised to call immediately if she has any concerning symptoms in the interval. The patient voices understanding of current disease status and treatment options and is in agreement with the current care plan.  All questions were answered. The patient knows to call the clinic with any problems, questions or concerns. We can certainly see the patient much sooner if necessary.  The total time spent in the appointment was 20 minutes.  Disclaimer: This note was dictated with voice recognition software. Similar sounding words can inadvertently be transcribed and may not be corrected upon review.

## 2023-06-01 ENCOUNTER — Ambulatory Visit (HOSPITAL_COMMUNITY)
Admission: RE | Admit: 2023-06-01 | Discharge: 2023-06-01 | Disposition: A | Discharge status: Home / Self Care | Payer: Medicare PPO | Source: Ambulatory Visit | Attending: Physician Assistant | Admitting: Physician Assistant

## 2023-06-01 DIAGNOSIS — Z08 Encounter for follow-up examination after completed treatment for malignant neoplasm: Secondary | ICD-10-CM | POA: Insufficient documentation

## 2023-06-01 DIAGNOSIS — K5641 Fecal impaction: Secondary | ICD-10-CM | POA: Diagnosis not present

## 2023-06-01 DIAGNOSIS — R918 Other nonspecific abnormal finding of lung field: Secondary | ICD-10-CM | POA: Diagnosis not present

## 2023-06-01 DIAGNOSIS — K5900 Constipation, unspecified: Secondary | ICD-10-CM | POA: Diagnosis not present

## 2023-06-01 DIAGNOSIS — K209 Esophagitis, unspecified without bleeding: Secondary | ICD-10-CM | POA: Diagnosis not present

## 2023-06-01 DIAGNOSIS — Z85820 Personal history of malignant melanoma of skin: Secondary | ICD-10-CM | POA: Insufficient documentation

## 2023-06-01 DIAGNOSIS — I700 Atherosclerosis of aorta: Secondary | ICD-10-CM | POA: Insufficient documentation

## 2023-06-01 MED ORDER — IOHEXOL 300 MG/ML  SOLN
30.0000 mL | Freq: Once | INTRAMUSCULAR | Status: AC | PRN
  Administered 2023-06-01: 30 mL via ORAL

## 2023-06-01 MED ORDER — IOHEXOL 300 MG/ML  SOLN
100.0000 mL | Freq: Once | INTRAMUSCULAR | Status: AC | PRN
  Administered 2023-06-01: 100 mL via INTRAVENOUS

## 2023-06-07 ENCOUNTER — Encounter: Payer: Self-pay | Admitting: Endocrinology

## 2023-06-07 ENCOUNTER — Other Ambulatory Visit: Payer: Self-pay

## 2023-06-07 ENCOUNTER — Ambulatory Visit: Payer: Medicare PPO | Admitting: Endocrinology

## 2023-06-07 VITALS — BP 96/70 | HR 72 | Resp 16 | Ht 61.0 in | Wt 99.8 lb

## 2023-06-07 DIAGNOSIS — E1165 Type 2 diabetes mellitus with hyperglycemia: Secondary | ICD-10-CM | POA: Diagnosis not present

## 2023-06-07 DIAGNOSIS — Z7984 Long term (current) use of oral hypoglycemic drugs: Secondary | ICD-10-CM | POA: Diagnosis not present

## 2023-06-07 LAB — POCT GLYCOSYLATED HEMOGLOBIN (HGB A1C): Hemoglobin A1C: 7.1 % — AB (ref 4.0–5.6)

## 2023-06-07 MED ORDER — SITAGLIPTIN PHOSPHATE 50 MG PO TABS
50.0000 mg | ORAL_TABLET | Freq: Every day | ORAL | 3 refills | Status: AC
Start: 2023-06-07 — End: ?

## 2023-06-07 MED ORDER — LANCETS MISC. MISC
1.0000 | Freq: Every day | 3 refills | Status: AC
Start: 2023-06-07 — End: 2024-06-06

## 2023-06-07 MED ORDER — BLOOD GLUCOSE MONITORING SUPPL DEVI
1.0000 | Freq: Three times a day (TID) | 0 refills | Status: DC
Start: 2023-06-07 — End: 2023-06-26

## 2023-06-07 MED ORDER — LANCET DEVICE MISC
1.0000 | Freq: Three times a day (TID) | 0 refills | Status: AC
Start: 2023-06-07 — End: 2023-07-07

## 2023-06-07 MED ORDER — BLOOD GLUCOSE TEST VI STRP
1.0000 | ORAL_STRIP | Freq: Every day | 3 refills | Status: AC
Start: 2023-06-07 — End: 2024-06-06

## 2023-06-07 MED ORDER — METFORMIN HCL ER 500 MG PO TB24: 1000.0000 mg | ORAL_TABLET | Freq: Two times a day (BID) | ORAL | 3 refills | Status: DC

## 2023-06-07 NOTE — Patient Instructions (Addendum)
 Latest Reference Range & Units 03/28/23 11:42 06/07/23 15:17  Hemoglobin A1C 4.0 - 5.6 % 8.8 (H) 7.1 !  (H): Data is abnormally high !: Data is abnormal  No change on medication, same metformin and januvia.

## 2023-06-07 NOTE — Progress Notes (Unsigned)
 Outpatient Endocrinology Note [place] Marianne Golightly, MD   Patient's Name: Gabrielle Robinson    DOB: Dec 18, 1940    MRN: [id number]                                                    REASON OF VISIT: New consult / Follow up for type 2 diabetes mellitus  REFERRING PROVIDER:   PCP: Shelva Majestic, MD  HISTORY OF PRESENT ILLNESS:   Gabrielle Robinson is a 83 y.o. old female with past medical history listed below, is here for new consult / follow up for type 2 diabetes mellitus.   Pertinent Diabetes History: _Diagnosed as Diabetes Mellitus type ***  in 2021, at the age of *** years.  Prior therapy: Initially managed with *** and insulin started in ***  History of DKA or diabetes related hospitalizations: ***none  Previous diabetes education: Yes ***  Family h/o diabetes mellitus: mother type 2 DM.    No personal history of pancreatitis and / or family history of medullary thyroid carcinoma or MEN 2B syndrome. ***  Chronic Diabetes Complications : Retinopathy: no. Last ophthalmology exam was done on 05/2022, following with ophthalmology regularly.  Nephropathy: no, on ACE/ARB *** Peripheral neuropathy: ***, on *** Coronary artery disease: *** Stroke: ***  Relevant comorbidities and cardiovascular risk factors: Obesity: *** Body mass index is 18.86 kg/m.  Hypertension: Yes *** Hyperlipidemia : Yes, on statin ***  Current / Home Diabetic regimen includes: Metformin XR 1000 mg two times a day. Januvia 50 mg daily.   Prior diabetic medications:  Glycemic data:    CONTINUOUS GLUCOSE MONITORING SYSTEM (CGMS) INTERPRETATION: At today's visit, we reviewed CGM downloads. The full report is scanned in the media. Reviewing the CGM trends, blood glucose are as follows:  Dexcom G7 CGM-  Sensor Download (Sensor download was reviewed and summarized below.) Dates: ***  Glucose Management Indicator: ***% Sensor Average: *** SD *** Sensor usage : *** %  Glycemic Trends:  <54: ***% 54-70:  ***% 71-180: ***% 181-250: ***% 251-400: ***%  Interpretation: - ***  FreeStyle Libre CGM-  Sensor Download (Sensor download was reviewed and summarized below.) Dates: *** Sensor Average: ***  Glucose Management Indicator: ***% Glucose Variability: ***%  % data captured: ***  Glycemic Trends:  <54: ***% 54-70: ***% 71-180: ***% 181-250: ***% 251-400: ***%   Interpretation: - ***  Gabrielle Robinson's glucose meter is computer downloaded. Raw data and trends analyzed.   -  She has been testing her blood glucoses *** times daily.  -  Average glucose for the last *** days is *** mg/dl, range *** - ***. -  Trends noted: ***   Hypoglycemia: Patient has *** hypoglycemic episodes. Patient has hypoglycemia awareness, has a glucagon ER kit and diabetic alert device ***.   Factors modifying glucose control: 1.  Diabetic diet assessment: ***  2.  Staying active or exercising: ***  3.  Medication compliance: compliant *** of the time.  Interval history  ***  REVIEW OF SYSTEMS As per history of present illness.   PAST MEDICAL HISTORY: Past Medical History:  Diagnosis Date   Cancer [facility])    Left Hip Melanoma   Diabetes mellitus without complication (HCC)    Hyperlipemia 01/05/2009   Qualifier: Diagnosis of  By: Lovell Sheehan MD, Balinda Quails    Major depressive disorder with single episode,  in full remission (HCC) 05/28/2015    PAST SURGICAL HISTORY: Past Surgical History:  Procedure Laterality Date   MELANOMA EXCISION Left 02/08/2018   Procedure: WIDE LOCAL EXCISION WITH ADVANCEMENT FLAP CLOSURE OF LEFT HIP MELANOMA ERAS PATHWAY;  Surgeon: Almond Lint, MD;  Location: [facility] OR;  Service: General;  Laterality: Left;   SENTINEL NODE BIOPSY Left 02/08/2018   Procedure: LEFT INGUINAL SENTINEL LYMPH NODE BIOPSY;  Surgeon: Almond Lint, MD;  Location: [facility] OR;  Service: General;  Laterality: Left;    ALLERGIES: Allergies  Allergen Reactions   Donepezil     Vomiting for first week of taking-did  not want to retrial but not fully clearly associated. Later retrialed and tolerating     FAMILY HISTORY:  Family History  Problem Relation Age of Onset   Diabetes Mellitus II Mother        died of "old age" right at 23   Diabetes Mellitus II Father        died of "old age" in 56s   Melanoma Sister    Breast cancer Maternal Grandmother    Renal cancer Maternal Grandfather     SOCIAL HISTORY: Social History   Socioeconomic History   Marital status: Married    Spouse name: Not on file   Number of children: Not on file   Years of education: Not on file   Highest education level: Bachelor's degree (e.g., BA, AB, BS)  Occupational History   Not on file  Tobacco Use   Smoking status: Never   Smokeless tobacco: Never  Vaping Use   Vaping status: Never Used  Substance and Sexual Activity   Alcohol use: Yes    Alcohol/week: 14.0 standard drinks of alcohol    Types: 14 Glasses of wine per week    Comment: encouraged max 1 per day   Drug use: No   Sexual activity: Yes    Partners: Male    Comment: husband  Other Topics Concern   Not on file  Social History Narrative   Home Situation: lives with husband. 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)       Hobbies: reading, enjoys time of at home      Left handed   Lives with husband   Social Drivers of Corporate investment banker Strain: Low Risk  (04/11/2023)   Overall Financial Resource Strain (CARDIA)    Difficulty of Paying Living Expenses: Not hard at all  Food Insecurity: No Food Insecurity (04/11/2023)   Hunger Vital Sign    Worried About Running Out of Food in the Last Year: Never true    Ran Out of Food in the Last Year: Never true  Transportation Needs: No Transportation Needs (04/11/2023)   PRAPARE - Administrator, Civil Service (Medical): No    Lack of Transportation (Non-Medical): No  Physical Activity:  Insufficiently Active (04/11/2023)   Exercise Vital Sign    Days of Exercise per Week: 2 days    Minutes of Exercise per Session: 10 min  Stress: No Stress Concern Present (04/11/2023)   [employer] of Occupational Health - Occupational Stress Questionnaire    Feeling of Stress : Only a little  Social Connections: Moderately Isolated (04/11/2023)   Social Connection and Isolation Panel [NHANES]    Frequency of Communication with Friends and Family: More than three times a week  Frequency of Social Gatherings with Friends and Family: Once a week    Attends Religious Services: Never    Database administrator or Organizations: No    Attends Engineer, structural: Never    Marital Status: Married    MEDICATIONS:  Current Outpatient Medications  Medication Sig Dispense Refill   aspirin EC 81 MG tablet Take 81 mg by mouth daily. Swallow whole.     Blood Glucose Monitoring Suppl DEVI 1 each by Does not apply route in the morning, at noon, and at bedtime. May substitute to any manufacturer covered by patient's insurance. 1 each 0   Blood Glucose Monitoring Suppl DEVI 1 each by Does not apply route in the morning, at noon, and at bedtime. May substitute to any manufacturer covered by patient's insurance. 1 each 0   Cyanocobalamin (VITAMIN B 12 PO) Take by mouth.     donepezil (ARICEPT) 5 MG tablet Take 5 mg by mouth at bedtime. Prior didn't tolerate but tolerating now per neurology     Glucose Blood (BLOOD GLUCOSE TEST STRIPS) STRP 1 each by In Vitro route daily. May substitute to any manufacturer covered by patient's insurance. 90 each 3   Lancet Device MISC 1 each by Does not apply route in the morning, at noon, and at bedtime. May substitute to any manufacturer covered by patient's insurance. 1 each 0   Lancets Misc. MISC 1 each by Does not apply route daily. May substitute to any manufacturer covered by patient's insurance. 90 each 3   metFORMIN (GLUCOPHAGE-XR) 500 MG 24 hr tablet  Take 2 tablets (1,000 mg total) by mouth 2 (two) times daily with a meal. 360 tablet 3   Multiple Vitamin (MULTIVITAMIN) capsule Take 1 capsule by mouth daily.     Polyethyl Glycol-Propyl Glycol (SYSTANE ULTRA OP) Place 1 drop into both eyes daily as needed (dry eyes).     rosuvastatin (CRESTOR) 10 MG tablet TAKE ONE TABLET BY MOUTH ONE TIME DAILY 90 tablet 0   sertraline (ZOLOFT) 25 MG tablet Take 25 mg by mouth daily.     sitaGLIPtin (JANUVIA) 50 MG tablet Take 1 tablet (50 mg total) by mouth daily. 30 tablet 5   No current facility-administered medications for this visit.    PHYSICAL EXAM: Vitals:   06/07/23 1511  BP: 96/70  Pulse: 72  Resp: 16  SpO2: 97%  Weight: 99 lb 12.8 oz (45.3 kg)  Height: 5\' 1"  (1.549 m)   Body mass index is 18.86 kg/m.  Wt Readings from Last 3 Encounters:  06/07/23 99 lb 12.8 oz (45.3 kg)  05/30/23 101 lb 9.6 oz (46.1 kg)  04/11/23 106 lb (48.1 kg)    General: Well developed, well nourished female in no apparent distress.  HEENT: AT/[place], no external lesions.  Eyes: Conjunctiva clear and no icterus. Neck: Neck supple  Lungs: Respirations not labored Neurologic: Alert, oriented, normal speech Extremities / Skin: Dry. No sores or rashes noted. No acanthosis nigricans Psychiatric: Does not appear depressed or anxious  Diabetic Foot Exam: Monofilament sensory exam intact / decreased b/l, no callus, no ulceration, Dorsalis Pedis 2+ b/l  Diabetic Foot Exam - Simple   No data filed     Diabetic Foot Examination {AMB DIABETIC FOOT VOZD:66440}  LABS Reviewed Lab Results  Component Value Date   HGBA1C 7.1 (A) 06/07/2023   HGBA1C 8.8 (H) 03/28/2023   HGBA1C 7.1 (H) 08/18/2022   No results found for: "FRUCTOSAMINE" Lab Results  Component Value Date  CHOL 153 09/23/2022   HDL 68.90 09/23/2022   LDLCALC 64 09/23/2022   LDLDIRECT 93 01/28/2022   TRIG 103.0 09/23/2022   CHOLHDL 2 09/23/2022   Lab Results  Component Value Date   MICRALBCREAT  1.1 09/23/2022   MICRALBCREAT 3.4 07/19/2021   Lab Results  Component Value Date   CREATININE 0.70 05/30/2023   Lab Results  Component Value Date   GFR 59.95 (L) 03/28/2023    ASSESSMENT / PLAN  1. Type 2 diabetes mellitus with hyperglycemia, without long-term current use of insulin (HCC)     Diabetes Mellitus type ***, complicated by *** - Diabetic status / severity: ***  Lab Results  Component Value Date   HGBA1C 7.1 (A) 06/07/2023    - Hemoglobin A1c goal : <***%  - Medications: ***  I) II) III) IV)  - Home glucose testing: *** - Discussed/ Gave Hypoglycemia treatment plan.  # Consult : not required at this time. ***  # Annual urine for microalbuminuria/ creatinine ratio, no microalbuminuria currently, continue ACE/ARB *** Last  Lab Results  Component Value Date   MICRALBCREAT 1.1 09/23/2022    # Foot check nightly / neuropathy, continue ***  # Annual dilated diabetic eye exams.   - Diet: {dmclinicdiabeticdiettype:42746::"Make healthy diabetic food choices","Eat reasonable portion sizes to promote a healthy weight"} - Life style / activity / exercise: {dmclinicactivity:42747}  2. Blood pressure  -  BP Readings from Last 1 Encounters:  06/07/23 96/70    - Control {IS/IS NOT:9024::"is"} in target.  - {endo rb hypertension recommendations:35631::"No change in current plans."}  3. Lipid status / Hyperlipidemia - Last  Lab Results  Component Value Date   LDLCALC 64 09/23/2022   - Continue ***   Diagnoses and all orders for this visit:  Type 2 diabetes mellitus with hyperglycemia, without long-term current use of insulin (HCC) -     POCT glycosylated hemoglobin (Hb A1C) -     Blood Glucose Monitoring Suppl DEVI; 1 each by Does not apply route in the morning, at noon, and at bedtime. May substitute to any manufacturer covered by patient's insurance. -     Glucose Blood (BLOOD GLUCOSE TEST STRIPS) STRP; 1 each by In Vitro route daily. May  substitute to any manufacturer covered by patient's insurance. -     Lancet Device MISC; 1 each by Does not apply route in the morning, at noon, and at bedtime. May substitute to any manufacturer covered by patient's insurance. -     Lancets Misc. MISC; 1 each by Does not apply route daily. May substitute to any manufacturer covered by patient's insurance.    DISPOSITION Follow up in clinic in   months suggested.   All questions answered and patient verbalized understanding of the plan.  [place] Braidyn Peace, MD [facility] Endocrinology [facility] [address] [place], [address] [place], [place] [address] Phone # [phone]  At least part of this note was generated using voice recognition software. Inadvertent word errors may have occurred, which were not recognized during the proofreading process.

## 2023-06-08 ENCOUNTER — Encounter: Payer: Self-pay | Admitting: Endocrinology

## 2023-06-11 ENCOUNTER — Other Ambulatory Visit: Payer: Self-pay | Admitting: Physician Assistant

## 2023-06-18 ENCOUNTER — Other Ambulatory Visit: Payer: Self-pay | Admitting: Physician Assistant

## 2023-06-26 ENCOUNTER — Encounter: Payer: Self-pay | Admitting: Family Medicine

## 2023-06-26 ENCOUNTER — Ambulatory Visit: Payer: Medicare PPO | Admitting: Family Medicine

## 2023-06-26 VITALS — BP 108/72 | HR 72 | Temp 97.3°F | Ht 61.0 in | Wt 98.6 lb

## 2023-06-26 DIAGNOSIS — F0390 Unspecified dementia without behavioral disturbance: Secondary | ICD-10-CM

## 2023-06-26 DIAGNOSIS — Z7984 Long term (current) use of oral hypoglycemic drugs: Secondary | ICD-10-CM | POA: Diagnosis not present

## 2023-06-26 DIAGNOSIS — E785 Hyperlipidemia, unspecified: Secondary | ICD-10-CM | POA: Diagnosis not present

## 2023-06-26 DIAGNOSIS — E1165 Type 2 diabetes mellitus with hyperglycemia: Secondary | ICD-10-CM | POA: Diagnosis not present

## 2023-06-26 NOTE — Progress Notes (Signed)
 Phone [phone] In person visit   Subjective:   Gabrielle Robinson is a 83 y.o. year old very pleasant female patient who presents for/with See problem oriented charting Chief Complaint  Patient presents with   Medical Management of Chronic Issues   Diabetes    DEE not scheduled.   Past Medical History-  Patient Active Problem List   Diagnosis Date Noted   History of cerebral infarction 05/12/2022    Priority: High   Major neurocognitive disorder (HCC) 05/12/2022    Priority: High   Diabetes mellitus (HCC) 04/01/2013    Priority: High   Vitamin D deficiency 10/22/2021    Priority: Medium    Atherosclerosis of aorta (HCC) 07/27/2021    Priority: Medium    History of melanoma 07/27/2021    Priority: Medium    Major depressive disorder with single episode, in full remission (HCC) 05/28/2015    Priority: Medium    Hyperlipemia 01/05/2009    Priority: Medium    Bilateral shoulder pain 05/28/2015    Priority: Low    Medications- reviewed and updated Current Outpatient Medications  Medication Sig Dispense Refill   aspirin EC 81 MG tablet Take 81 mg by mouth daily. Swallow whole.     Cyanocobalamin (VITAMIN B 12 PO) Take by mouth.     donepezil (ARICEPT) 5 MG tablet TAKE ONE TABLET BY MOUTH ONE TIME DAILY 30 tablet 0   Glucose Blood (BLOOD GLUCOSE TEST STRIPS) STRP 1 each by In Vitro route daily. May substitute to any manufacturer covered by patient's insurance. 90 each 3   Lancet Device MISC 1 each by Does not apply route in the morning, at noon, and at bedtime. May substitute to any manufacturer covered by patient's insurance. 1 each 0   Lancets Misc. MISC 1 each by Does not apply route daily. May substitute to any manufacturer covered by patient's insurance. 90 each 3   metFORMIN (GLUCOPHAGE-XR) 500 MG 24 hr tablet Take 2 tablets (1,000 mg total) by mouth 2 (two) times daily with a meal. 360 tablet 3   Multiple Vitamin (MULTIVITAMIN) capsule Take 1 capsule by mouth daily.      Polyethyl Glycol-Propyl Glycol (SYSTANE ULTRA OP) Place 1 drop into both eyes daily as needed (dry eyes).     rosuvastatin (CRESTOR) 10 MG tablet TAKE ONE TABLET BY MOUTH ONE TIME DAILY 90 tablet 0   sitaGLIPtin (JANUVIA) 50 MG tablet Take 1 tablet (50 mg total) by mouth daily. 90 tablet 3   No current facility-administered medications for this visit.     Objective:  BP 108/72   Pulse 72   Temp (!) 97.3 F (36.3 C)   Ht 5\' 1"  (1.549 m)   Wt 98 lb 9.6 oz (44.7 kg)   SpO2 96%   BMI 18.63 kg/m  Gen: NAD, resting comfortably CV: RRR no murmurs rubs or gallops Lungs: CTAB no crackles, wheeze, rhonchi Ext: no edema Skin: warm, dry Neuro: some memory changes- leans on husband     Assessment and Plan   #social update- considering [place]. Lost hearing aids.   #major neurocognitive disorder- sees neurology [place] S:Medication: donepazil 5 mg - trial donepezil 5 mg daily started 05/16/22- threw up everyday for a week and stopped -later retried and did ok -January 2024 MRI-noted prior lacunar infarct-now on aspirin A/P: overall stable- continues close follow up with neurology- continue current medications    # Diabetes- sees Dr. Erroll Luna- peak a1c of 10.7 in November 2022 off meds S: Medication:Metformin 1000  mg extended release  twice daily , januvia 50 mg added November 2024 -prior significant weight loss/gastrointestinal distress on Rybelsus 14 mg- now off - avoid shots per patient  Lab Results  Component Value Date   HGBA1C 7.1 (A) 06/07/2023   HGBA1C 8.8 (H) 03/28/2023   HGBA1C 7.1 (H) 08/18/2022  A/P: diabetes with drastic improvement- has had first visit with Dr. Erroll Luna and plan was to continue current medications that we had previously started in november -plans to get her scheduled for diabetes eye exam  I discussed the microalbumin to creatinine lab error with patient that occurred with [place] labs.  Essentially the ratio was off by a factor of 10.  In this patient's  individual case most recent value corrects to 11 so still under 30.     #History of lacunar infarct #hyperlipidemia #aortic atherosclerosis - LDL goal 70 S: Medication:Pravastatin 40 mg--> rosuvastatin 10 mg, aspirin 81 mg Lab Results  Component Value Date   CHOL 153 09/23/2022   HDL 68.90 09/23/2022   LDLCALC 64 09/23/2022   LDLDIRECT 93 01/28/2022   TRIG 103.0 09/23/2022   CHOLHDL 2 09/23/2022  A/P: lipids at goal- continue current medications  Aortic atherosclerosis (presumed stable)- LDL goal ideally <70 - at goal- continue current medications  History of stroke- remain on aspirin    # anxiety/Depression- tolerating being off medications. 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 Arbutus Ped  Recommended follow up: No follow-ups on file. Future Appointments  Date Time Provider Department Center  09/13/2023  2:00 PM [place], [place], MD [facility] None  09/18/2023  2:30 PM Marcos Eke, PA-C [facility] None  04/15/2024  3:00 PM [facility] ANNUAL WELLNESS VISIT 1 [facility]EC  05/30/2024 10:00 AM [facility] None  05/30/2024 10:30 AM Si Gaul, MD [facility] None    Lab/Order associations:   ICD-10-CM   1. Type 2 diabetes mellitus with hyperglycemia, without long-term current use of insulin (HCC)  E11.65     2. Major neurocognitive disorder (HCC)  F03.90     3. Hyperlipidemia, unspecified hyperlipidemia type  E78.5       No orders of the defined types were placed in this encounter.   Return precautions advised.  Tana Conch, MD

## 2023-06-26 NOTE — Patient Instructions (Addendum)
 Get diabetic exam scheduled.  Please check with your pharmacy to see if they have the shingrix vaccine. If they do- please get this immunization and update [place] by phone call or mychart with dates you receive the vaccine  Recommended follow up: Return in about 6 months (around 12/26/2023) for physical or sooner if needed.Schedule b4 you leave.

## 2023-07-11 ENCOUNTER — Telehealth: Payer: Self-pay

## 2023-07-11 NOTE — Telephone Encounter (Unsigned)
 Copied from CRM [id number]. Topic: Clinical - Medication Question >> Jul 10, 2023 12:08 PM [place] J wrote: Reason for CRM: Patient's husband calling in trying to figure out all medications she should actively be taking as of now.   Please call husband back at: [phone]  This message is in reference to pt's husband's medications, called and spoke with pt's husband

## 2023-07-16 ENCOUNTER — Other Ambulatory Visit: Payer: Self-pay | Admitting: Family Medicine

## 2023-07-21 ENCOUNTER — Encounter: Payer: Self-pay | Admitting: Physician Assistant

## 2023-08-03 DIAGNOSIS — C44722 Squamous cell carcinoma of skin of right lower limb, including hip: Secondary | ICD-10-CM | POA: Diagnosis not present

## 2023-08-03 DIAGNOSIS — Z85820 Personal history of malignant melanoma of skin: Secondary | ICD-10-CM | POA: Diagnosis not present

## 2023-08-03 DIAGNOSIS — Z85828 Personal history of other malignant neoplasm of skin: Secondary | ICD-10-CM | POA: Diagnosis not present

## 2023-08-03 DIAGNOSIS — D485 Neoplasm of uncertain behavior of skin: Secondary | ICD-10-CM | POA: Diagnosis not present

## 2023-08-08 ENCOUNTER — Other Ambulatory Visit: Payer: Self-pay

## 2023-08-13 ENCOUNTER — Other Ambulatory Visit: Payer: Self-pay | Admitting: Family Medicine

## 2023-08-14 DIAGNOSIS — E119 Type 2 diabetes mellitus without complications: Secondary | ICD-10-CM | POA: Diagnosis not present

## 2023-08-14 LAB — HM DIABETES EYE EXAM

## 2023-08-24 ENCOUNTER — Ambulatory Visit: Payer: Self-pay

## 2023-08-24 NOTE — Telephone Encounter (Signed)
 Ardena Becker calling.  Chief Complaint: general malaise Symptoms: incontinence, loss of appetite, low fluid intake, increased sleeping Frequency: about a week Pertinent Negatives: Patient denies fever, pain with urination, dementia/altz, difficulty breathing, abd pain, CP Disposition: [] ED /[] Urgent Care (no appt availability in office) / [x] Appointment(In office/virtual)/ []  [facility]/ [] Home Care/ [] Refused Recommended Disposition /[] [facility]/ []  Follow-up with PCP Additional Notes: Caller states that pt had "light" stroke last year. Husband states that pt is sleeping a lot, lacking energy, and loss of appetite. Pt had episode of incontinence today. Pt low fluid intake. Pt has no physical complaints. Attempted to schedule today, husband states that he has an appt. Pt scheduled for tomorrow.  Copied from CRM [id number]. Topic: Clinical - Red Word Triage >> Aug 24, 2023 12:58 PM Freya Jesus wrote: Red Word that prompted transfer to Nurse Triage: Patient spouse called said she's been sleeping a lot, lack of energy and not eating much, may be dehydrated. Reason for Disposition  [1] MODERATE weakness (i.e., interferes with work, school, normal activities) AND [2] persists > 3 days  Answer Assessment - Initial Assessment Questions 1. DESCRIPTION: "Describe how you are feeling."     Caller is husband of pt, states that she has been sleeping more and eating less 2. SEVERITY: "How bad is it?"  "Can you stand and walk?"   - MILD (0-3): Feels weak or tired, but does not interfere with work, school or normal activities.   - MODERATE (4-7): Able to stand and walk; weakness interferes with work, school, or normal activities.   - SEVERE (8-10): Unable to stand or walk; unable to do usual activities.     Mild, still walking and standing 3. ONSET: "When did these symptoms begin?" (e.g., hours, days, weeks, months)     About a week 4. CAUSE: "What do you think is causing the weakness or  fatigue?" (e.g., not drinking enough fluids, medical problem, trouble sleeping)     Unsure, pt may be dehydrated, pt has had diarrhea and has not been eating/drinking like normal 5. NEW MEDICINES:  "Have you started on any new medicines recently?" (e.g., opioid pain medicines, benzodiazepines, muscle relaxants, antidepressants, antihistamines, neuroleptics, beta blockers)     denies 6. OTHER SYMPTOMS: "Do you have any other symptoms?" (e.g., chest pain, fever, cough, SOB, vomiting, diarrhea, bleeding, other areas of pain)     Diarrhea, denies pain,  Protocols used: Weakness (Generalized) and Fatigue-A-AH

## 2023-08-24 NOTE — Telephone Encounter (Signed)
 Appt tomorrow.

## 2023-08-25 ENCOUNTER — Ambulatory Visit: Admitting: Physician Assistant

## 2023-08-27 ENCOUNTER — Other Ambulatory Visit: Payer: Self-pay | Admitting: Physician Assistant

## 2023-08-28 ENCOUNTER — Ambulatory Visit: Admitting: Family Medicine

## 2023-08-29 ENCOUNTER — Ambulatory Visit: Admitting: Family Medicine

## 2023-08-31 ENCOUNTER — Encounter: Payer: Self-pay | Admitting: Family Medicine

## 2023-08-31 ENCOUNTER — Ambulatory Visit: Admitting: Family Medicine

## 2023-08-31 VITALS — BP 110/70 | HR 74 | Temp 97.9°F | Ht 61.0 in | Wt 99.0 lb

## 2023-08-31 DIAGNOSIS — Z7984 Long term (current) use of oral hypoglycemic drugs: Secondary | ICD-10-CM

## 2023-08-31 DIAGNOSIS — E1165 Type 2 diabetes mellitus with hyperglycemia: Secondary | ICD-10-CM

## 2023-08-31 DIAGNOSIS — R32 Unspecified urinary incontinence: Secondary | ICD-10-CM | POA: Diagnosis not present

## 2023-08-31 DIAGNOSIS — E785 Hyperlipidemia, unspecified: Secondary | ICD-10-CM | POA: Diagnosis not present

## 2023-08-31 DIAGNOSIS — F325 Major depressive disorder, single episode, in full remission: Secondary | ICD-10-CM

## 2023-08-31 DIAGNOSIS — R197 Diarrhea, unspecified: Secondary | ICD-10-CM | POA: Diagnosis not present

## 2023-08-31 NOTE — Progress Notes (Signed)
 Phone [phone] In person visit   Subjective:   Gabrielle Robinson is a 83 y.o. year old very pleasant female patient who presents for/with See problem oriented charting Chief Complaint  Patient presents with   Fatigue    Lack of energy started 5 days ago.   loss of appetite    Started 5 days ago   Urinary Incontinence    Pt c/o incontinence with bowel and urine    Past Medical History-  Patient Active Problem List   Diagnosis Date Noted   History of cerebral infarction 05/12/2022    Priority: High   Major neurocognitive disorder (HCC) 05/12/2022    Priority: High   Diabetes mellitus (HCC) 04/01/2013    Priority: High   Vitamin D  deficiency 10/22/2021    Priority: Medium    Atherosclerosis of aorta (HCC) 07/27/2021    Priority: Medium    History of melanoma 07/27/2021    Priority: Medium    Major depressive disorder with single episode, in full remission (HCC) 05/28/2015    Priority: Medium    Hyperlipemia 01/05/2009    Priority: Medium    Bilateral shoulder pain 05/28/2015    Priority: Low    Medications- reviewed and updated Current Outpatient Medications  Medication Sig Dispense Refill   aspirin EC 81 MG tablet Take 81 mg by mouth daily. Swallow whole.     Cyanocobalamin  (VITAMIN B 12 PO) Take by mouth.     donepezil  (ARICEPT ) 5 MG tablet TAKE ONE TABLET BY MOUTH ONE TIME DAILY 30 tablet 0   Glucose Blood (BLOOD GLUCOSE TEST STRIPS) STRP 1 each by In Vitro route daily. May substitute to any manufacturer covered by patient's insurance. 90 each 3   Lancets Misc. MISC 1 each by Does not apply route daily. May substitute to any manufacturer covered by patient's insurance. 90 each 3   metFORMIN  (GLUCOPHAGE -XR) 500 MG 24 hr tablet Take 2 tablets (1,000 mg total) by mouth 2 (two) times daily with a meal. 360 tablet 3   Multiple Vitamin (MULTIVITAMIN) capsule Take 1 capsule by mouth daily.     Polyethyl Glycol-Propyl Glycol (SYSTANE ULTRA OP) Place 1 drop into both eyes  daily as needed (dry eyes).     rosuvastatin  (CRESTOR ) 10 MG tablet TAKE ONE TABLET BY MOUTH ONE TIME DAILY 90 tablet 0   sertraline  (ZOLOFT ) 25 MG tablet TAKE ONE TABLET BY MOUTH ONCE A DAY 30 tablet 0   sitaGLIPtin  (JANUVIA ) 50 MG tablet Take 1 tablet (50 mg total) by mouth daily. 90 tablet 3   No current facility-administered medications for this visit.     Objective:  BP 110/70   Pulse 74   Temp 97.9 F (36.6 C)   Ht 5\' 1"  (1.549 m)   Wt 99 lb (44.9 kg)   SpO2 96%   BMI 18.71 kg/m  Gen: Unkempt hair-General Odor noted which is atypical for her Oropharynx normal, nasal turbinates normal CV: RRR no murmurs rubs or gallops Lungs: CTAB no crackles, wheeze, rhonchi Abdomen: soft/nontender/nondistended/normal bowel sounds. No rebound or guarding.  No reported back pain Ext: no edema Skin: warm, dry Neuro: Able to respond to me but seems less engaged than usual,    Assessment and Plan   # Fatigue/loss of appetite/incontinence/worsening mentation S: About 14 days ago patient started noticing a loss of appetite-this was accompanied by drop in energy levels. Walking has slowed. Talking has slowed.   She also has experienced urinary incontinence as well as bowel incontinence started worsening  around the same time-   Of note last A1c was improved at 7.1 down from 8.8. declines having sugar checked.  Weight stable from last visit. No dysuria.  - stool incontinence twice a day. No obvious leg weakness but moving slower. Stools loose/watery like for last 2 weeks as well.  She can hold a conversation but seems completely unaware about incontinence and lacks motivation to address it even when reported.  She also has essentially declined to shower when normally is motivated to do this on her own  No facial or extremity weakness. No slurred words or trouble swallowing. no blurry vision or double vision. No paresthesias. No confusion or word finding difficulties.   A/P: Patient with known  dementia but with significant decline in the last 2 weeks including fatigue, loss of appetite, frequent urinary incontinence and lack of awareness of this-simply sitting in her own urine as well as fecal incontinence and sitting in her stool with diarrhea about twice a day - We considered referral to the hospital versus attempting initial outpatient evaluation and family preferred to try the initial outpatient evaluation knowing that if she has any further declined to take her to the hospital - Check CBC, CMP, TSH - Check urinalysis and urine culture -With diarrhea for 2 weeks also check GI pathogen panel and C. difficile-possible infectious diarrhea could cause such a constellation of symptoms -Good strength in legs and no saddle anesthesia or back pain reported-consider some sort of spine etiology but in no obvious issues here.  Also consider brain MRI with prior stroke history but no reported focal neurodeficits-does have melanoma history and would certainly want to rule out brain metastasis with MRI   #major neurocognitive disorder- sees neurology [place] S:Medication: donepazil 5 mg -January 2024 MRI-noted prior lacunar infarct-now on aspirin A/P: Seem to have a rather significant decline-concern for me for a secondary factor causing further cognitive decline.  It is possible she is having accelerated cognitive decline as well although I am hopeful that is not the case-as above close follow-up and consider brain MRI  # Diabetes- S: Medication:Metformin  1000 mg extended release  twice daily , januvia  50 mg added November 2024  Lab Results  Component Value Date   HGBA1C 7.1 (A) 06/07/2023   HGBA1C 8.8 (H) 03/28/2023   HGBA1C 7.1 (H) 08/18/2022   A/P: With urinary incontinence some concern blood sugar may have spike-not checking sugar at all at this point  #History of lacunar infarct #hyperlipidemia #aortic atherosclerosis - LDL goal 70 S: Medication:Pravastatin  40 mg--> rosuvastatin  10  mg, aspirin 81 mg  Lab Results  Component Value Date   CHOL 153 09/23/2022   HDL 68.90 09/23/2022   LDLCALC 64 09/23/2022   LDLDIRECT 93 01/28/2022   TRIG 103.0 09/23/2022   CHOLHDL 2 09/23/2022    A/P: History of stroke but lipids have been at goal-also compliant with aspirin-continue current medication for now but as above consider MRI of the brain   # anxiety/Depression S: Medication: none-has had prior issues with diarrhea on Zoloft  and had stopped so stopped again recently A/P: Appears controlled-no significant change in anxiety or depression off of Zoloft -continue to monitor off of medicines at this time  Recommended follow up: Return in about 1 week (around 09/07/2023) for followup or sooner if needed.Schedule b4 you leave. Future Appointments  Date Time Provider Department Center  09/13/2023  2:00 PM Thapa, [place], MD [facility] None  11/01/2023  3:30 PM Rosi Converse, PA-C [facility] None  04/11/2024  2:00  PM Almira Jaeger, MD [facility]  04/15/2024  3:00 PM [facility] ANNUAL WELLNESS VISIT 1 [facility]  05/30/2024 10:00 AM [facility] None  05/30/2024 10:30 AM Marlene Simas, MD [facility] None    Lab/Order associations:   ICD-10-CM   1. Type 2 diabetes mellitus with hyperglycemia, without long-term current use of insulin (HCC)  E11.65 Comprehensive metabolic panel with GFR    CBC with Differential/Platelet    Hemoglobin A1c    2. Hyperlipidemia, unspecified hyperlipidemia type  E78.5 Comprehensive metabolic panel with GFR    CBC with Differential/Platelet    TSH    3. Urinary incontinence, unspecified type  R32 Urinalysis, Routine w reflex microscopic    Urine Culture    4. Diarrhea, unspecified type  R19.7 Gastrointestinal Pathogen Pnl RT, PCR    C. difficile GDH and Toxin A/B    5. Major depressive disorder with single episode, in full remission (HCC)  F32.5       No orders of the defined types were placed in this encounter.   Return  precautions advised.  Clarisa Crooked, MD

## 2023-08-31 NOTE — Patient Instructions (Addendum)
 Please stop by lab before you go If you have mychart- we will send your results within 3 business days of us  receiving them.  If you do not have mychart- we will call you about results within 5 business days of us  receiving them.  *please also note that you will see labs on mychart as soon as they post. I will later go in and write notes on them- will say "notes from Dr. Arlene Ben"   Also need stool collection kit    Recommended follow up: Return in about 1 week (around 09/07/2023) for followup or sooner if needed.Schedule b4 you leave. Or 2 weeks  11 20 on the 11th if possible to recheck If symptoms worsen do not hesitate to go to hospital

## 2023-09-01 ENCOUNTER — Ambulatory Visit: Admitting: Family Medicine

## 2023-09-01 LAB — URINE CULTURE
MICRO NUMBER:: 16543797
SPECIMEN QUALITY:: ADEQUATE

## 2023-09-01 LAB — URINALYSIS, ROUTINE W REFLEX MICROSCOPIC
Bilirubin Urine: NEGATIVE
Nitrite: NEGATIVE
Specific Gravity, Urine: 1.025 (ref 1.000–1.030)
Urine Glucose: NEGATIVE
Urobilinogen, UA: 0.2 (ref 0.0–1.0)
pH: 6 (ref 5.0–8.0)

## 2023-09-01 LAB — COMPREHENSIVE METABOLIC PANEL WITH GFR
ALT: 21 U/L (ref 0–35)
AST: 26 U/L (ref 0–37)
Albumin: 4.7 g/dL (ref 3.5–5.2)
Alkaline Phosphatase: 59 U/L (ref 39–117)
BUN: 25 mg/dL — ABNORMAL HIGH (ref 6–23)
CO2: 31 meq/L (ref 19–32)
Calcium: 10.7 mg/dL — ABNORMAL HIGH (ref 8.4–10.5)
Chloride: 99 meq/L (ref 96–112)
Creatinine, Ser: 0.91 mg/dL (ref 0.40–1.20)
GFR: 58.19 mL/min — ABNORMAL LOW (ref >60.00)
Glucose, Bld: 163 mg/dL — ABNORMAL HIGH (ref 70–99)
Potassium: 4.4 meq/L (ref 3.5–5.1)
Sodium: 138 meq/L (ref 135–145)
Total Bilirubin: 0.4 mg/dL (ref 0.2–1.2)
Total Protein: 7.5 g/dL (ref 6.0–8.3)

## 2023-09-01 LAB — CBC WITH DIFFERENTIAL/PLATELET
Basophils Absolute: 0.1 10*3/uL (ref 0.0–0.1)
Basophils Relative: 1.1 % (ref 0.0–3.0)
Eosinophils Absolute: 0.2 10*3/uL (ref 0.0–0.7)
Eosinophils Relative: 2.5 % (ref 0.0–5.0)
HCT: 39 % (ref 36.0–46.0)
Hemoglobin: 13.1 g/dL (ref 12.0–15.0)
Lymphocytes Relative: 21.7 % (ref 12.0–46.0)
Lymphs Abs: 2 10*3/uL (ref 0.7–4.0)
MCHC: 33.6 g/dL (ref 30.0–36.0)
MCV: 87.6 fl (ref 78.0–100.0)
Monocytes Absolute: 0.8 10*3/uL (ref 0.1–1.0)
Monocytes Relative: 9.1 % (ref 3.0–12.0)
Neutro Abs: 6.2 10*3/uL (ref 1.4–7.7)
Neutrophils Relative %: 65.6 % (ref 43.0–77.0)
Platelets: 330 10*3/uL (ref 150.0–400.0)
RBC: 4.45 Mil/uL (ref 3.87–5.11)
RDW: 12.8 % (ref 11.5–15.5)
WBC: 9.4 10*3/uL (ref 4.0–10.5)

## 2023-09-01 LAB — HEMOGLOBIN A1C: Hgb A1c MFr Bld: 7.2 % — ABNORMAL HIGH (ref 4.6–6.5)

## 2023-09-03 ENCOUNTER — Other Ambulatory Visit: Payer: Self-pay | Admitting: Physician Assistant

## 2023-09-04 ENCOUNTER — Ambulatory Visit: Payer: Self-pay | Admitting: Family Medicine

## 2023-09-07 ENCOUNTER — Encounter: Payer: Self-pay | Admitting: Family Medicine

## 2023-09-07 ENCOUNTER — Ambulatory Visit: Admitting: Family Medicine

## 2023-09-07 VITALS — BP 138/70 | HR 97 | Temp 97.9°F | Ht 61.0 in | Wt 100.4 lb

## 2023-09-07 DIAGNOSIS — I700 Atherosclerosis of aorta: Secondary | ICD-10-CM | POA: Diagnosis not present

## 2023-09-07 DIAGNOSIS — Z7984 Long term (current) use of oral hypoglycemic drugs: Secondary | ICD-10-CM

## 2023-09-07 DIAGNOSIS — E559 Vitamin D deficiency, unspecified: Secondary | ICD-10-CM | POA: Diagnosis not present

## 2023-09-07 DIAGNOSIS — E785 Hyperlipidemia, unspecified: Secondary | ICD-10-CM

## 2023-09-07 DIAGNOSIS — E1165 Type 2 diabetes mellitus with hyperglycemia: Secondary | ICD-10-CM | POA: Diagnosis not present

## 2023-09-07 DIAGNOSIS — F0390 Unspecified dementia without behavioral disturbance: Secondary | ICD-10-CM

## 2023-09-07 LAB — TSH: TSH: 2.17 u[IU]/mL (ref 0.35–5.50)

## 2023-09-07 NOTE — Patient Instructions (Addendum)
 Only thing obvious on labs was some dehydration and that was likely contributing to high calcium - iw ant to recheck this once we push hydration some more and follow up in 1 month to do bloodwork as well -hold one a day vitamin as well for now  Glad other labs were largely reassuring and she's feeling better   Recommended follow up: Return in about 1 month (around 10/07/2023) for followup or sooner if needed.Schedule b4 you leave.

## 2023-09-07 NOTE — Progress Notes (Signed)
 Phone [phone] In person visit   Subjective:   Gabrielle Robinson is a 83 y.o. year old very pleasant female patient who presents for/with See problem oriented charting Chief Complaint  Patient presents with   1 week f/u    Past Medical History-  Patient Active Problem List   Diagnosis Date Noted   History of cerebral infarction 05/12/2022    Priority: High   Major neurocognitive disorder (HCC) 05/12/2022    Priority: High   Diabetes mellitus (HCC) 04/01/2013    Priority: High   Vitamin D  deficiency 10/22/2021    Priority: Medium    Atherosclerosis of aorta (HCC) 07/27/2021    Priority: Medium    History of melanoma 07/27/2021    Priority: Medium    Major depressive disorder with single episode, in full remission (HCC) 05/28/2015    Priority: Medium    Hyperlipemia 01/05/2009    Priority: Medium    Bilateral shoulder pain 05/28/2015    Priority: Low    Medications- reviewed and updated Current Outpatient Medications  Medication Sig Dispense Refill   aspirin EC 81 MG tablet Take 81 mg by mouth daily. Swallow whole.     Cyanocobalamin  (VITAMIN B 12 PO) Take by mouth.     donepezil  (ARICEPT ) 5 MG tablet TAKE ONE TABLET BY MOUTH ONE TIME DAILY 30 tablet 0   Glucose Blood (BLOOD GLUCOSE TEST STRIPS) STRP 1 each by In Vitro route daily. May substitute to any manufacturer covered by patient's insurance. 90 each 3   Lancets Misc. MISC 1 each by Does not apply route daily. May substitute to any manufacturer covered by patient's insurance. 90 each 3   metFORMIN  (GLUCOPHAGE -XR) 500 MG 24 hr tablet Take 2 tablets (1,000 mg total) by mouth 2 (two) times daily with a meal. 360 tablet 3   Multiple Vitamin (MULTIVITAMIN) capsule Take 1 capsule by mouth daily.     Polyethyl Glycol-Propyl Glycol (SYSTANE ULTRA OP) Place 1 drop into both eyes daily as needed (dry eyes).     rosuvastatin  (CRESTOR ) 10 MG tablet TAKE ONE TABLET BY MOUTH ONE TIME DAILY 90 tablet 0   sertraline  (ZOLOFT ) 25 MG  tablet TAKE ONE TABLET BY MOUTH ONCE A DAY 30 tablet 0   sitaGLIPtin  (JANUVIA ) 50 MG tablet Take 1 tablet (50 mg total) by mouth daily. 90 tablet 3   No current facility-administered medications for this visit.     Objective:  BP 138/70   Pulse 97   Temp 97.9 F (36.6 C)   Ht 5' 1 (1.549 m)   Wt 100 lb 6.4 oz (45.5 kg)   SpO2 95%   BMI 18.97 kg/m  Gen: NAD, resting comfortably on table when I entered the room-she was ready to leave as I was running behind CV: RRR no murmurs rubs or gallops Lungs: CTAB no crackles, wheeze, rhonchi Abdomen: soft/nontender/nondistended/normal bowel sounds. No rebound or guarding.  Ext: no edema Skin: warm, dry Neuro: Normal gait    Assessment and Plan    # Decline in health follow-up-fatigue/loss of appetite/incontinence/worsening mentation S: At last visit per patient and son patient had a significant decline in health about 14 days prior to visit with loss of appetite, decreased energy, slowed walking, slowed mentation-increased urinary incontinence as well as new onset bowel incontinence and diarrhea  Fortunately today there seems to be some improvement per husband - Energy level still low - Talking and walking still slowed - Continues to have worsened cognition/short memory - Loss of appetite but  weight is stable  Her incontinence seems to be better-Spiering urinary incontinence and this appears to basically be at her baseline.  Prior twice a day incontinence has decreased to perhaps 3 times a week but has bowel movement daily.  Still not very interested in showering  They were unable to collect stool sample due to unpredictable bowel movements but reports still having diarrhea.  Apparently she still does sit in the bowel movement when it occurs at times.  Not sitting in urine anymore.  Previously reported diarrhea on Zoloft  but symptoms did not resolve off of it so is currently taking that-we could reconsider  On her labs we did note mild  hypercalcemia and patient does take multivitamin with calcium  in it and we also noted potential dehydration based on elevated BUN/creatinine ratio-husband admits she does not hydrate well A/P: Significant decline reported previously but seems to be improved.  Urine culture was negative.  Reviewed all labs together-urine culture without UTI-urinalysis abnormalities could be related to dehydration with trace protein, trace ketones, trace hemoglobin but none on microscopic, rare bacteria, small leukocytes.  CBC was very reassuring, CMP with mild hyperglycemia, GFR in the 50s, elevated BUN/creatinine ratio - We opted to push fluids to see if they can help her feel better and repeat labs at follow-up-would also include B12 next visit with memory issues.  Have considered brain MRI Underwent that  #major neurocognitive disorder- sees neurology [place] S:Medication: donepazil 5 mg - trial donepezil  5 mg daily started 05/16/22- -January 2024 MRI-noted prior lacunar infarct-now on aspirin A/P: Dementia noted-continue donepezil -has had some GI issues with this in the past so possible that could be cause of diarrhea.  Ongoing cognitive decline could be related to disease process but also could repeat MRI to rule out repeat stroke-prior lacunar infarct noted  # Diabetes- sees Dr. Aretha Kubas- peak a1c of 10.7 in November 2022 off meds S: Medication:Metformin  1000 mg extended release  twice daily , januvia  50 mg added November 2024 -prior significant weight loss/gastrointestinal distress on Rybelsus  14 mg- now off Lab Results  Component Value Date   HGBA1C 7.2 (H) 08/31/2023   HGBA1C 7.1 (A) 06/07/2023   HGBA1C 8.8 (H) 03/28/2023  A/P: Patient seems to be doing reasonably well although diarrhea could be related to metformin  or Januvia -we opted to continue current medications for now  #History of lacunar infarct #hyperlipidemia #aortic atherosclerosis - LDL goal 70 S: Medication:Pravastatin  40 mg--> rosuvastatin   10 mg, aspirin 81 mg  Lab Results  Component Value Date   CHOL 153 09/23/2022   HDL 68.90 09/23/2022   LDLCALC 64 09/23/2022   LDLDIRECT 93 01/28/2022   TRIG 103.0 09/23/2022   CHOLHDL 2 09/23/2022    A/P: Lipids at goal most recently-continue current medication-likely check lipid panel at follow-up  # Anxiety-back on sertraline  25 mg-continue current medication-previously caused diarrhea but symptoms did not resolve off of it and may simply be at her baseline with diarrhea  #Vitamin D  deficiency S: Medication: d3 1000 units Last vitamin D  Lab Results  Component Value Date   VD25OH 50.56 09/23/2022  A/P: Good control most recent check-consider repeat at follow-up  # 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 Romelle Clutter will be 1 reason to consider MRI if things do not stabilize further  Recommended follow up: Return in about 1 month (around 10/07/2023) for followup or sooner if needed.Schedule b4 you leave. Future Appointments  Date Time Provider Department Center  09/13/2023  2:00 PM Thapa, [place], MD [facility] None  10/10/2023  3:00 PM Almira Jaeger, MD [facility]  11/01/2023  3:30 PM Rosi Converse, PA-C [facility] None  04/11/2024  2:00 PM Almira Jaeger, MD [facility]  04/15/2024  3:00 PM [facility] ANNUAL WELLNESS VISIT 1 [facility]  05/30/2024 10:00 AM [facility] None  05/30/2024 10:30 AM Marlene Simas, MD [facility] None    Lab/Order associations:   ICD-10-CM   1. Type 2 diabetes mellitus with hyperglycemia, without long-term current use of insulin (HCC)  E11.65     2. Major neurocognitive disorder (HCC)  F03.90     3. Atherosclerosis of aorta (HCC)  I70.0     4. Hyperlipidemia, unspecified hyperlipidemia type  E78.5     5. Vitamin D  deficiency  E55.9       No orders of the defined types were placed in this encounter.   Return  precautions advised.  Clarisa Crooked, MD

## 2023-09-11 ENCOUNTER — Other Ambulatory Visit: Payer: Self-pay

## 2023-09-11 ENCOUNTER — Emergency Department (HOSPITAL_COMMUNITY)
Admission: EM | Admit: 2023-09-11 | Discharge: 2023-09-11 | Disposition: A | Discharge status: Home / Self Care | Attending: Emergency Medicine | Admitting: Emergency Medicine

## 2023-09-11 ENCOUNTER — Encounter (HOSPITAL_COMMUNITY): Payer: Self-pay

## 2023-09-11 ENCOUNTER — Emergency Department (HOSPITAL_COMMUNITY)

## 2023-09-11 DIAGNOSIS — R1110 Vomiting, unspecified: Secondary | ICD-10-CM | POA: Diagnosis not present

## 2023-09-11 DIAGNOSIS — R9089 Other abnormal findings on diagnostic imaging of central nervous system: Secondary | ICD-10-CM | POA: Diagnosis not present

## 2023-09-11 DIAGNOSIS — S0003XA Contusion of scalp, initial encounter: Secondary | ICD-10-CM | POA: Diagnosis not present

## 2023-09-11 DIAGNOSIS — E119 Type 2 diabetes mellitus without complications: Secondary | ICD-10-CM | POA: Diagnosis not present

## 2023-09-11 DIAGNOSIS — W19XXXA Unspecified fall, initial encounter: Secondary | ICD-10-CM

## 2023-09-11 DIAGNOSIS — S199XXA Unspecified injury of neck, initial encounter: Secondary | ICD-10-CM | POA: Diagnosis not present

## 2023-09-11 DIAGNOSIS — W01198A Fall on same level from slipping, tripping and stumbling with subsequent striking against other object, initial encounter: Secondary | ICD-10-CM | POA: Insufficient documentation

## 2023-09-11 DIAGNOSIS — Z7982 Long term (current) use of aspirin: Secondary | ICD-10-CM | POA: Insufficient documentation

## 2023-09-11 DIAGNOSIS — Z7984 Long term (current) use of oral hypoglycemic drugs: Secondary | ICD-10-CM | POA: Diagnosis not present

## 2023-09-11 DIAGNOSIS — R1111 Vomiting without nausea: Secondary | ICD-10-CM | POA: Diagnosis not present

## 2023-09-11 DIAGNOSIS — Z79899 Other long term (current) drug therapy: Secondary | ICD-10-CM | POA: Insufficient documentation

## 2023-09-11 DIAGNOSIS — R519 Headache, unspecified: Secondary | ICD-10-CM | POA: Diagnosis not present

## 2023-09-11 DIAGNOSIS — I6523 Occlusion and stenosis of bilateral carotid arteries: Secondary | ICD-10-CM | POA: Diagnosis not present

## 2023-09-11 DIAGNOSIS — S0990XA Unspecified injury of head, initial encounter: Secondary | ICD-10-CM | POA: Diagnosis not present

## 2023-09-11 DIAGNOSIS — I10 Essential (primary) hypertension: Secondary | ICD-10-CM | POA: Diagnosis not present

## 2023-09-11 NOTE — ED Provider Notes (Signed)
 [place] EMERGENCY DEPARTMENT AT [facility] Provider Note   CSN: [id number] Arrival date & time: 09/11/23  1709     Patient presents with: Gabrielle Robinson is a 83 y.o. female.   HPI    83 year old patient with history of hyperlipidemia, diabetes and depression comes in with chief complaint of fall.  Patient resides at home.  She has history of dementia. Per EMS, patient fell and hit concrete.  She had vomiting x 1.  Patient complains of headache.  She denies pain anywhere else.  She is not blood thinners.  Prior to Admission medications   Medication Sig Start Date End Date Taking? Authorizing Provider  aspirin EC 81 MG tablet Take 81 mg by mouth daily. Swallow whole.    [provider]  Cyanocobalamin  (VITAMIN B 12 PO) Take by mouth.    [provider]  donepezil  (ARICEPT ) 5 MG tablet TAKE ONE TABLET BY MOUTH ONE TIME DAILY 08/28/23   Wertman, Sara E, PA-C  Glucose Blood (BLOOD GLUCOSE TEST STRIPS) STRP 1 each by In Vitro route daily. May substitute to any manufacturer covered by patient's insurance. 06/07/23 06/06/24  Thapa, [place], MD  Lancets Misc. MISC 1 each by Does not apply route daily. May substitute to any manufacturer covered by patient's insurance. 06/07/23 06/06/24  Thapa, [place], MD  metFORMIN  (GLUCOPHAGE -XR) 500 MG 24 hr tablet Take 2 tablets (1,000 mg total) by mouth 2 (two) times daily with a meal. 06/07/23   Thapa, [place], MD  Multiple Vitamin (MULTIVITAMIN) capsule Take 1 capsule by mouth daily.    [provider]  Polyethyl Glycol-Propyl Glycol (SYSTANE ULTRA OP) Place 1 drop into both eyes daily as needed (dry eyes).    [provider]  rosuvastatin  (CRESTOR ) 10 MG tablet TAKE ONE TABLET BY MOUTH ONE TIME DAILY 07/18/23   Almira Jaeger, MD  sertraline  (ZOLOFT ) 25 MG tablet TAKE ONE TABLET BY MOUTH ONCE A DAY 08/14/23   Almira Jaeger, MD  sitaGLIPtin  (JANUVIA ) 50 MG tablet Take 1 tablet (50 mg total) by mouth  daily. 06/07/23   Thapa, [place], MD    Allergies: Donepezil     Review of Systems  All other systems reviewed and are negative.   Updated Vital Signs BP (!) 158/60 (BP Location: Left Arm)   Pulse 83   Temp 98.2 F (36.8 C) (Oral)   Resp 12   SpO2 99%   Physical Exam Vitals and nursing note reviewed.  Constitutional:      Appearance: She is well-developed.  HENT:     Head: Normocephalic and atraumatic.     Comments: 0.5 cm superficial laceration over the occiput/crown. No active bleeding  Eyes:     Pupils: Pupils are equal, round, and reactive to light.   Neck:     Comments: In a c-collar Cardiovascular:     Rate and Rhythm: Normal rate and regular rhythm.  Pulmonary:     Effort: Pulmonary effort is normal.     Breath sounds: Normal breath sounds.  Abdominal:     General: Bowel sounds are normal. There is no distension.     Palpations: Abdomen is soft.     Tenderness: There is no abdominal tenderness.   Musculoskeletal:        General: No tenderness or deformity.     Comments: No long bone tenderness - upper and lower extrmeities and no pelvic pain, instability.   Skin:    General: Skin is warm and dry.  Findings: No rash.   Neurological:     Mental Status: She is alert and oriented to person, place, and time.     Cranial Nerves: No cranial nerve deficit.     (all labs ordered are listed, but only abnormal results are displayed) Labs Reviewed - No data to display  EKG: None  Radiology: CT Head Wo Contrast Result Date: 09/11/2023 CLINICAL DATA:  Head trauma, moderate-severe; Neck trauma (Age >= 65y) EXAM: CT HEAD WITHOUT CONTRAST CT CERVICAL SPINE WITHOUT CONTRAST TECHNIQUE: Multidetector CT imaging of the head and cervical spine was performed following the standard protocol without intravenous contrast. Multiplanar CT image reconstructions of the cervical spine were also generated. RADIATION DOSE REDUCTION: This exam was performed according to the  departmental dose-optimization program which includes automated exposure control, adjustment of the mA and/or kV according to patient size and/or use of iterative reconstruction technique. COMPARISON:  MRI head 04/18/2022 FINDINGS: CT HEAD FINDINGS Brain: Cerebral ventricle sizes are concordant with the degree of cerebral volume loss. Patchy and confluent areas of decreased attenuation are noted throughout the deep and periventricular white matter of the cerebral hemispheres bilaterally, compatible with chronic microvascular ischemic disease. No evidence of large-territorial acute infarction. No parenchymal hemorrhage. No mass lesion. No extra-axial collection. No mass effect or midline shift. No hydrocephalus. Basilar cisterns are patent. Vascular: No hyperdense vessel. Atherosclerotic calcifications are present within the cavernous internal carotid arteries. Skull: No acute fracture or focal lesion. Sinuses/Orbits: Paranasal sinuses and mastoid air cells are clear. The orbits are unremarkable. Other: None. CT CERVICAL SPINE FINDINGS Alignment: Normal. Skull base and vertebrae: Multilevel mild-to-moderate degenerative changes spine. Associated moderate to severe osseous neural foraminal stenosis at the bilateral C4-C5 and C5-C6 levels. No acute fracture. No aggressive appearing focal osseous lesion or focal pathologic process. Soft tissues and spinal canal: No prevertebral fluid or swelling. No visible canal hematoma. Upper chest: Biapical pleural/pulmonary scarring. Other: None. IMPRESSION: 1. No acute intracranial abnormality. 2. No acute displaced fracture or traumatic listhesis of the cervical spine. 3. Multilevel mild-to-moderate degenerative changes spine. Associated moderate to severe osseous neural foraminal stenosis at the bilateral C4-C5 and C5-C6 levels. Electronically Signed   By: Morgane  Naveau M.D.   On: 09/11/2023 18:08   CT Cervical Spine Wo Contrast Result Date: 09/11/2023 CLINICAL DATA:  Head  trauma, moderate-severe; Neck trauma (Age >= 65y) EXAM: CT HEAD WITHOUT CONTRAST CT CERVICAL SPINE WITHOUT CONTRAST TECHNIQUE: Multidetector CT imaging of the head and cervical spine was performed following the standard protocol without intravenous contrast. Multiplanar CT image reconstructions of the cervical spine were also generated. RADIATION DOSE REDUCTION: This exam was performed according to the departmental dose-optimization program which includes automated exposure control, adjustment of the mA and/or kV according to patient size and/or use of iterative reconstruction technique. COMPARISON:  MRI head 04/18/2022 FINDINGS: CT HEAD FINDINGS Brain: Cerebral ventricle sizes are concordant with the degree of cerebral volume loss. Patchy and confluent areas of decreased attenuation are noted throughout the deep and periventricular white matter of the cerebral hemispheres bilaterally, compatible with chronic microvascular ischemic disease. No evidence of large-territorial acute infarction. No parenchymal hemorrhage. No mass lesion. No extra-axial collection. No mass effect or midline shift. No hydrocephalus. Basilar cisterns are patent. Vascular: No hyperdense vessel. Atherosclerotic calcifications are present within the cavernous internal carotid arteries. Skull: No acute fracture or focal lesion. Sinuses/Orbits: Paranasal sinuses and mastoid air cells are clear. The orbits are unremarkable. Other: None. CT CERVICAL SPINE FINDINGS Alignment: Normal. Skull base and vertebrae: Multilevel  mild-to-moderate degenerative changes spine. Associated moderate to severe osseous neural foraminal stenosis at the bilateral C4-C5 and C5-C6 levels. No acute fracture. No aggressive appearing focal osseous lesion or focal pathologic process. Soft tissues and spinal canal: No prevertebral fluid or swelling. No visible canal hematoma. Upper chest: Biapical pleural/pulmonary scarring. Other: None. IMPRESSION: 1. No acute intracranial  abnormality. 2. No acute displaced fracture or traumatic listhesis of the cervical spine. 3. Multilevel mild-to-moderate degenerative changes spine. Associated moderate to severe osseous neural foraminal stenosis at the bilateral C4-C5 and C5-C6 levels. Electronically Signed   By: Morgane  Naveau M.D.   On: 09/11/2023 18:08     Procedures   Medications Ordered in the ED - No data to display                                  Medical Decision Making Amount and/or Complexity of Data Reviewed Radiology: ordered.   82 year old patient comes in after sustaining what appears to be a mechanical fall. Pertinent past medical includes stroke, neuro-cognitive dz, dm, hl Collateral history provided by EMS.  I called patient's spouse at [phone].  There was no response.  Voicemail was not left.  Based on my history and exam, differential diagnosis includes: - Traumatic brain injury including intracranial hemorrhage - Long bone fractures - Contusions - Soft tissue injury - Concussion  Based on the initial assessment, the following workup was initiated CT scan of the head, C-spine.  I have independently interpreted the following imaging from the perspective of acute trauma: CT head And the results indicate no brain bleed.  Patient's wound was clean.  She has a very superficial laceration that is currently not bleeding.  It is not gaping wound and it is very small in length.  No need for staples.  Reassessment:  Pt's son received report on exam results. TOC consulted to assist them with placement if that is something they want in the future.  Final diagnoses:  Fall, initial encounter  Hematoma of scalp, initial encounter    ED Discharge Orders     None          Deatra Face, MD 09/12/23 1513

## 2023-09-11 NOTE — Discharge Instructions (Signed)
 We saw you in the ER after you had a fall. CT scan of the brain and cervical spine results are normal, no fractures seen. No evidence of brain bleed. We did not see any deep gaping laceration that required staples. Dressing has been applied to the area of bleeding.  Bleeding has stopped. Please be very careful with walking, and do everything possible to prevent falls.

## 2023-09-11 NOTE — ED Notes (Signed)
 Patient ambulated to restroom with family and returned to bed with no minimal assistance.

## 2023-09-11 NOTE — ED Provider Triage Note (Signed)
 Emergency Medicine Provider Triage Evaluation Note  Gabrielle Robinson , a 83 y.o. female  was evaluated in triage.  Pt complains of fall. See separate note.    Deatra Face, MD 09/12/23 [phone]

## 2023-09-11 NOTE — Care Management (Signed)
 Received TOC consult for [facility] services.  Patient presents today with fall. Patient lives at home with elderly husband who happens to be in the ED and is being admitted. I went to speak with patient and son at bedside, and nurse informs me that patient has been taken home by son. CM attempted to contact son to discuss [facility] recommendation but no answer left message to return call.   Ulyess Gammons RN, BSN CNOR ED RN Care Manager [phone]

## 2023-09-11 NOTE — Care Management (Signed)
 Spoke with spouse and son Danissa Rundle Sr. And Marieta Shorten concerning [facility] services and both are agreeable.  No preferences just within network. Referral sent to [facility].

## 2023-09-11 NOTE — ED Triage Notes (Addendum)
 Pt coming from home for a fall, hx of dementia. Pt hit head on concrete, pt has lacerations to back of head. Pt has dried blood on head, no obvious source. 1 episode of vomiting. No blood thinners. Pt arrives in c-collar.

## 2023-09-13 ENCOUNTER — Ambulatory Visit: Admitting: Endocrinology

## 2023-09-14 DIAGNOSIS — F325 Major depressive disorder, single episode, in full remission: Secondary | ICD-10-CM | POA: Diagnosis not present

## 2023-09-14 DIAGNOSIS — E119 Type 2 diabetes mellitus without complications: Secondary | ICD-10-CM | POA: Diagnosis not present

## 2023-09-14 DIAGNOSIS — S0101XD Laceration without foreign body of scalp, subsequent encounter: Secondary | ICD-10-CM | POA: Diagnosis not present

## 2023-09-14 DIAGNOSIS — E785 Hyperlipidemia, unspecified: Secondary | ICD-10-CM | POA: Diagnosis not present

## 2023-09-14 DIAGNOSIS — F0393 Unspecified dementia, unspecified severity, with mood disturbance: Secondary | ICD-10-CM | POA: Diagnosis not present

## 2023-09-14 DIAGNOSIS — E559 Vitamin D deficiency, unspecified: Secondary | ICD-10-CM | POA: Diagnosis not present

## 2023-09-14 DIAGNOSIS — M4802 Spinal stenosis, cervical region: Secondary | ICD-10-CM | POA: Diagnosis not present

## 2023-09-14 DIAGNOSIS — I700 Atherosclerosis of aorta: Secondary | ICD-10-CM | POA: Diagnosis not present

## 2023-09-14 DIAGNOSIS — M47812 Spondylosis without myelopathy or radiculopathy, cervical region: Secondary | ICD-10-CM | POA: Diagnosis not present

## 2023-09-18 ENCOUNTER — Ambulatory Visit: Payer: Medicare PPO | Admitting: Physician Assistant

## 2023-09-18 DIAGNOSIS — E785 Hyperlipidemia, unspecified: Secondary | ICD-10-CM | POA: Diagnosis not present

## 2023-09-18 DIAGNOSIS — F0393 Unspecified dementia, unspecified severity, with mood disturbance: Secondary | ICD-10-CM | POA: Diagnosis not present

## 2023-09-18 DIAGNOSIS — M47812 Spondylosis without myelopathy or radiculopathy, cervical region: Secondary | ICD-10-CM | POA: Diagnosis not present

## 2023-09-18 DIAGNOSIS — I700 Atherosclerosis of aorta: Secondary | ICD-10-CM | POA: Diagnosis not present

## 2023-09-18 DIAGNOSIS — F325 Major depressive disorder, single episode, in full remission: Secondary | ICD-10-CM | POA: Diagnosis not present

## 2023-09-18 DIAGNOSIS — E559 Vitamin D deficiency, unspecified: Secondary | ICD-10-CM | POA: Diagnosis not present

## 2023-09-18 DIAGNOSIS — E119 Type 2 diabetes mellitus without complications: Secondary | ICD-10-CM | POA: Diagnosis not present

## 2023-09-18 DIAGNOSIS — S0101XD Laceration without foreign body of scalp, subsequent encounter: Secondary | ICD-10-CM | POA: Diagnosis not present

## 2023-09-18 DIAGNOSIS — M4802 Spinal stenosis, cervical region: Secondary | ICD-10-CM | POA: Diagnosis not present

## 2023-09-19 ENCOUNTER — Other Ambulatory Visit: Payer: Self-pay | Admitting: Family Medicine

## 2023-09-19 ENCOUNTER — Telehealth: Payer: Self-pay

## 2023-09-19 NOTE — Telephone Encounter (Signed)
 I am okay with verbal orders but must keep appointment on July 15 as a reference point/face-to-face

## 2023-09-19 NOTE — Telephone Encounter (Signed)
 Verbal orders given and pt advised to keep upcoming appt.

## 2023-09-19 NOTE — Telephone Encounter (Signed)
 Copied from CRM [id number]. Topic: Clinical - Home Health Verbal Orders >> Sep 18, 2023  2:43 PM Thersia BROCKS wrote: Caller/Agency: Ethelene Rushing Number: [phone] Service Requested: Physical Therapy Frequency: 1 week for 8 weeks  Any new concerns about the patient? No

## 2023-09-21 ENCOUNTER — Ambulatory Visit: Admitting: Physician Assistant

## 2023-09-21 ENCOUNTER — Encounter: Payer: Self-pay | Admitting: Physician Assistant

## 2023-09-21 VITALS — BP 136/74 | HR 76 | Resp 20 | Ht 61.0 in | Wt 99.0 lb

## 2023-09-21 DIAGNOSIS — M4802 Spinal stenosis, cervical region: Secondary | ICD-10-CM | POA: Diagnosis not present

## 2023-09-21 DIAGNOSIS — F325 Major depressive disorder, single episode, in full remission: Secondary | ICD-10-CM | POA: Diagnosis not present

## 2023-09-21 DIAGNOSIS — I700 Atherosclerosis of aorta: Secondary | ICD-10-CM | POA: Diagnosis not present

## 2023-09-21 DIAGNOSIS — M47812 Spondylosis without myelopathy or radiculopathy, cervical region: Secondary | ICD-10-CM | POA: Diagnosis not present

## 2023-09-21 DIAGNOSIS — E119 Type 2 diabetes mellitus without complications: Secondary | ICD-10-CM | POA: Diagnosis not present

## 2023-09-21 DIAGNOSIS — S0101XD Laceration without foreign body of scalp, subsequent encounter: Secondary | ICD-10-CM | POA: Diagnosis not present

## 2023-09-21 DIAGNOSIS — F0390 Unspecified dementia without behavioral disturbance: Secondary | ICD-10-CM

## 2023-09-21 DIAGNOSIS — E559 Vitamin D deficiency, unspecified: Secondary | ICD-10-CM | POA: Diagnosis not present

## 2023-09-21 DIAGNOSIS — F0393 Unspecified dementia, unspecified severity, with mood disturbance: Secondary | ICD-10-CM | POA: Diagnosis not present

## 2023-09-21 DIAGNOSIS — E785 Hyperlipidemia, unspecified: Secondary | ICD-10-CM | POA: Diagnosis not present

## 2023-09-21 MED ORDER — DONEPEZIL HCL 10 MG PO TABS: 10.0000 mg | ORAL_TABLET | Freq: Every day | ORAL | 3 refills | Status: AC

## 2023-09-21 NOTE — Patient Instructions (Signed)
 It was a pleasure to see you today at our office.   Recommendations:    Follow up in 6 months    Increase donepezil  to 10 mg a day for better coverage  Continue replenishing B12 and D Monitor the sugars    Whom to call:  Memory  decline, memory medications: Call our office [phone]   For psychiatric meds, mood meds: Please have your primary care physician manage these medications.     For assessment of decision of mental capacity and competency:  Call Dr. Rosaline Nine, geriatric psychiatrist at [phone]  For guidance in geriatric dementia issues please call [facility] [phone]     If you have any severe symptoms of a stroke, or other severe issues such as confusion,severe chills or fever, etc call 911 or go to the ER as you may need to be evaluated further   Feel free to visit Facebook page  [employer] for tips of how to care for people with memory problems.      RECOMMENDATIONS FOR ALL PATIENTS WITH MEMORY PROBLEMS: 1. Continue to exercise (Recommend 30 minutes of walking everyday, or 3 hours every week) 2. Increase social interactions - continue going to [place] and enjoy social gatherings with friends and family 3. Eat healthy, avoid fried foods and eat more fruits and vegetables 4. Maintain adequate blood pressure, blood sugar, and blood cholesterol level. Reducing the risk of stroke and cardiovascular disease also helps promoting better memory. 5. Avoid stressful situations. Live a simple life and avoid aggravations. Organize your time and prepare for the next day in anticipation. 6. Sleep well, avoid any interruptions of sleep and avoid any distractions in the bedroom that may interfere with adequate sleep quality 7. Avoid sugar, avoid sweets as there is a strong link between excessive sugar intake, diabetes, and cognitive impairment We discussed the Mediterranean diet, which has been shown to help patients reduce the risk of progressive memory  disorders and reduces cardiovascular risk. This includes eating fish, eat fruits and green leafy vegetables, nuts like almonds and hazelnuts, walnuts, and also use olive oil. Avoid fast foods and fried foods as much as possible. Avoid sweets and sugar as sugar use has been linked to worsening of memory function.  There is always a concern of gradual progression of memory problems. If this is the case, then we may need to adjust level of care according to patient needs. Support, both to the patient and caregiver, should then be put into place.       FALL PRECAUTIONS: Be cautious when walking. Scan the area for obstacles that may increase the risk of trips and falls. When getting up in the mornings, sit up at the edge of the bed for a few minutes before getting out of bed. Consider elevating the bed at the head end to avoid drop of blood pressure when getting up. Walk always in a well-lit room (use night lights in the walls). Avoid area rugs or power cords from appliances in the middle of the walkways. Use a walker or a cane if necessary and consider physical therapy for balance exercise. Get your eyesight checked regularly.  FINANCIAL OVERSIGHT: Supervision, especially oversight when making financial decisions or transactions is also recommended.  HOME SAFETY: Consider the safety of the kitchen when operating appliances like stoves, microwave oven, and blender. Consider having supervision and share cooking responsibilities until no longer able to participate in those. Accidents with firearms and other hazards in the house should be identified  and addressed as well.   ABILITY TO BE LEFT ALONE: If patient is unable to contact 911 operator, consider using LifeLine, or when the need is there, arrange for someone to stay with patients. Smoking is a fire hazard, consider supervision or cessation. Risk of wandering should be assessed by caregiver and if detected at any point, supervision and safe proof  recommendations should be instituted.  MEDICATION SUPERVISION: Inability to self-administer medication needs to be constantly addressed. Implement a mechanism to ensure safe administration of the medications.   DRIVING: Regarding driving, in patients with progressive memory problems, driving will be impaired. We advise to have someone else do the driving if trouble finding directions or if minor accidents are reported. Independent driving assessment is available to determine safety of driving.   If you are interested in the driving assessment, you can contact the following:  [employer] in [place] [phone]  [facility] [phone]  [facility] [phone] or [phone]    Mediterranean Diet A Mediterranean diet refers to food and lifestyle choices that are based on the traditions of countries located on the [employer]. This way of eating has been shown to help prevent certain conditions and improve outcomes for people who have chronic diseases, like kidney disease and heart disease. What are tips for following this plan? Lifestyle  Cook and eat meals together with your family, when possible. Drink enough fluid to keep your urine clear or pale yellow. Be physically active every day. This includes: Aerobic exercise like running or swimming. Leisure activities like gardening, walking, or housework. Get 7-8 hours of sleep each night. If recommended by your health care provider, drink red wine in moderation. This means 1 glass a day for nonpregnant women and 2 glasses a day for men. A glass of wine equals 5 oz (150 mL). Reading food labels  Check the serving size of packaged foods. For foods such as rice and pasta, the serving size refers to the amount of cooked product, not dry. Check the total fat in packaged foods. Avoid foods that have saturated fat or trans fats. Check the ingredients list for  added sugars, such as corn syrup. Shopping  At the grocery store, buy most of your food from the areas near the walls of the store. This includes: Fresh fruits and vegetables (produce). Grains, beans, nuts, and seeds. Some of these may be available in unpackaged forms or large amounts (in bulk). Fresh seafood. Poultry and eggs. Low-fat dairy products. Buy whole ingredients instead of prepackaged foods. Buy fresh fruits and vegetables in-season from local farmers markets. Buy frozen fruits and vegetables in resealable bags. If you do not have access to quality fresh seafood, buy precooked frozen shrimp or canned fish, such as tuna, salmon, or sardines. Buy small amounts of raw or cooked vegetables, salads, or olives from the deli or salad bar at your store. Stock your pantry so you always have certain foods on hand, such as olive oil, canned tuna, canned tomatoes, rice, pasta, and beans. Cooking  Cook foods with extra-virgin olive oil instead of using butter or other vegetable oils. Have meat as a side dish, and have vegetables or grains as your main dish. This means having meat in small portions or adding small amounts of meat to foods like pasta or stew. Use beans or vegetables instead of meat in common dishes like chili or lasagna. Experiment with different cooking methods. Try roasting or broiling vegetables instead of steaming  or sauteing them. Add frozen vegetables to soups, stews, pasta, or rice. Add nuts or seeds for added healthy fat at each meal. You can add these to yogurt, salads, or vegetable dishes. Marinate fish or vegetables using olive oil, lemon juice, garlic, and fresh herbs. Meal planning  Plan to eat 1 vegetarian meal one day each week. Try to work up to 2 vegetarian meals, if possible. Eat seafood 2 or more times a week. Have healthy snacks readily available, such as: Vegetable sticks with hummus. Greek yogurt. Fruit and nut trail mix. Eat balanced meals throughout  the week. This includes: Fruit: 2-3 servings a day Vegetables: 4-5 servings a day Low-fat dairy: 2 servings a day Fish, poultry, or lean meat: 1 serving a day Beans and legumes: 2 or more servings a week Nuts and seeds: 1-2 servings a day Whole grains: 6-8 servings a day Extra-virgin olive oil: 3-4 servings a day Limit red meat and sweets to only a few servings a month What are my food choices? Mediterranean diet Recommended Grains: Whole-grain pasta. Brown rice. Bulgar wheat. Polenta. Couscous. Whole-wheat bread. [place] Madeira. Vegetables: Artichokes. Beets. Broccoli. Cabbage. Carrots. Eggplant. Green beans. Chard. Kale. Spinach. Onions. Leeks. Peas. Squash. Tomatoes. Peppers. Radishes. Fruits: Apples. Apricots. Avocado. Berries. Bananas. Cherries. Dates. Figs. Grapes. Lemons. Melon. Oranges. Peaches. Plums. Pomegranate. Meats and other protein foods: Beans. Almonds. Sunflower seeds. Pine nuts. Peanuts. Cod. Salmon. Scallops. Shrimp. Tuna. Tilapia. Clams. Oysters. Eggs. Dairy: Low-fat milk. Cheese. Greek yogurt. Beverages: Water. Red wine. Herbal tea. Fats and oils: Extra virgin olive oil. Avocado oil. Grape seed oil. Sweets and desserts: [place] yogurt with honey. Baked apples. Poached pears. Trail mix. Seasoning and other foods: Basil. Cilantro. Coriander. Cumin. Mint. Parsley. Sage. Rosemary. Tarragon. Garlic. Oregano. Thyme. Pepper. Balsalmic vinegar. Tahini. Hummus. Tomato sauce. Olives. Mushrooms. Limit these Grains: Prepackaged pasta or rice dishes. Prepackaged cereal with added sugar. Vegetables: Deep fried potatoes (french fries). Fruits: Fruit canned in syrup. Meats and other protein foods: Beef. Pork. Lamb. Poultry with skin. Hot dogs. [place]. Dairy: Ice cream. Sour cream. Whole milk. Beverages: Juice. Sugar-sweetened soft drinks. Beer. Liquor and spirits. Fats and oils: Butter. Canola oil. Vegetable oil. Beef fat (tallow). Lard. Sweets and desserts: Cookies. Cakes. Pies.  Candy. Seasoning and other foods: Mayonnaise. Premade sauces and marinades. The items listed may not be a complete list. Talk with your dietitian about what dietary choices are right for you. Summary The Mediterranean diet includes both food and lifestyle choices. Eat a variety of fresh fruits and vegetables, beans, nuts, seeds, and whole grains. Limit the amount of red meat and sweets that you eat. Talk with your health care provider about whether it is safe for you to drink red wine in moderation. This means 1 glass a day for nonpregnant women and 2 glasses a day for men. A glass of wine equals 5 oz (150 mL). This information is not intended to replace advice given to you by your health care provider. Make sure you discuss any questions you have with your health care provider. Document Released: 11/05/2015 Document Revised: 12/08/2015 Document Reviewed: 11/05/2015 Elsevier Interactive Patient Education  2017 [employer].     [address]

## 2023-09-21 NOTE — Progress Notes (Signed)
 Assessment/Plan:   Dementia likely due to Alzheimer's disease  Gabrielle Robinson is a very pleasant 83 y.o. RH female with a history of hypertension, hyperlipidemia, DM2, history of depression, vitamin D  deficiency, history of melanoma, B12 deficiency, chronic shoulder pain, history of CVA per imaging seen today in follow up for memory loss. Patient is currently on donepezil  10 mg daily.  She is able to participate on her ADLs and to drive without difficulties.  Mood is stable.   Follow up in 6  months. Continue donepezil  10 mg daily as per PCP, side effects discussed  Continue B12 and D supplements Recommend good control of her cardiovascular risk factors.  Continue baby aspirin Continue to control mood as per PCP.  She is on Zoloft       Subjective:    This patient is accompanied in the office by her son who supplements the history.  Previous records as well as any outside records available were reviewed prior to todays visit. Patient was last seen on 03/16/2023.    Any changes in memory since last visit?  Short-term memory is worse .  She has difficulty remembering recent conversations and names, dates and times.   However she speaks on complete sentences per son's report. She does not read as extensively as she did before LTM is good, but her concept of time is may be affected, as before. repeats oneself?  Endorsed Disoriented when walking into a room? Denies    Leaving objects?  May misplace things but not in unusual places   Wandering behavior?  denies   Any personality changes since last visit? She is preoccupied about her husband who is hospitalized for stroke.  As before, she can become frustrated and defensive at times due to memory issues. Any worsening depression?:  Denies.   Hallucinations or paranoia?  Denies.   Seizures? denies    Any sleep changes?  Sleeps well.  Denies vivid dreams, REM behavior or sleepwalking   Sleep apnea?   Denies.   Any hygiene concerns?  She  needs to be reminded.  She is not interested in showering. Independent of bathing and dressing?  Endorsed  Does the patient needs help with medications?  Husband is in charge   Who is in charge of the finances?  Husband is in charge     Any changes in appetite?  denies.  She always ate 2 meals a day.  She likes sweets such as ice cream    Patient have trouble swallowing? Denies.   Does the patient cook? No Any headaches?   denies   Any vision changes? Denies  Chronic back pain  denies   Ambulates with difficulty?  Her walking is slower than before   Recent falls or head injuries?  On 09/11/2023 the patient sustained a mechanical fall, no loss of consciousness, with a hematoma on the scalp.  No LOC.  Negative CT of the head and neck.  No fractures Unilateral weakness, numbness or tingling? denies   Any tremors?  Denies    Any anosmia?  Denies   Any incontinence of urine?  Endorsed .   Any bowel dysfunction?   Intermittent diarrhea, now with bowel incontinence     Patient lives with her husband    Does the patient drive yes, but not as often as before.  Denies getting lost    MRI brain from January 2024 personally reviewed was remarkable for mild chronic small vessel ischemic changes within the cerebral white matter, chronic  lacunar infarct within the left basal ganglia, moderate to advanced generalized cerebral atrophy, mild cerebellar atrophy     Initial visit on 05/17/2022 How long did patient have memory difficulties? She denies any memory issues. Husband noticed some slight memory issues 2 months ago, confusion about dates and times and some difficulty remembering recent conversations and people names  repeats oneself? Denies  Disoriented when walking into a room?  Patient denies Leaving objects in unusual places?  Endorsed, but not in unusual places   Wandering behavior? denies   Any personality changes since last visit? She gets discouraged when solving an issue, but nothing  radical Any worsening depression?:  denies   Hallucinations or paranoia?  denies   Seizures?   denies    Any sleep changes?  Denies  vivid dreams, REM behavior or sleepwalking   Sleep apnea? denies   Any hygiene concerns?  denies   Independent of bathing and dressing?  Endorsed  Does the patient need help with medications? Husband is in charge   Who is in charge of the finances? Husband  is in charge     Any changes in appetite?  Both eat 2 meals, not too hungry    Patient have trouble swallowing?  Denies   Does the patient cook? No  Any kitchen accidents such as leaving the stove on? Patient denies   Any headaches?  Denies   Chronic back pain  Denies  I never had pain Ambulates with difficulty?   Denies   Recent falls or head injuries?  denies     Unilateral weakness, numbness or tingling?  denies   Any tremors?  denies   Any anosmia?  denies   Any incontinence of urine?  denies   Any bowel dysfunction?  Intermittent diarrhea.  Patient lives  with her husband  History of heavy alcohol intake?glass or two of wine at night, maybe a little more but not overindulgent History of heavy tobacco use?   denies   Family history of dementia?  Denies  Dose patient drive? She had one episode where she missed a road or two   Retired Runner, broadcasting/film/video. College degree  PREVIOUS MEDICATIONS:   CURRENT MEDICATIONS:  Outpatient Encounter Medications as of 09/21/2023  Medication Sig   aspirin EC 81 MG tablet Take 81 mg by mouth daily. Swallow whole.   Cyanocobalamin  (VITAMIN B 12 PO) Take by mouth.   donepezil  (ARICEPT ) 10 MG tablet Take 1 tablet (10 mg total) by mouth daily.   Glucose Blood (BLOOD GLUCOSE TEST STRIPS) STRP 1 each by In Vitro route daily. May substitute to any manufacturer covered by patient's insurance.   Lancets Misc. MISC 1 each by Does not apply route daily. May substitute to any manufacturer covered by patient's insurance.   metFORMIN  (GLUCOPHAGE -XR) 500 MG 24 hr tablet Take  2 tablets (1,000 mg total) by mouth 2 (two) times daily with a meal.   Multiple Vitamin (MULTIVITAMIN) capsule Take 1 capsule by mouth daily.   Polyethyl Glycol-Propyl Glycol (SYSTANE ULTRA OP) Place 1 drop into both eyes daily as needed (dry eyes).   rosuvastatin  (CRESTOR ) 10 MG tablet TAKE ONE TABLET BY MOUTH ONE TIME DAILY   sertraline  (ZOLOFT ) 25 MG tablet TAKE ONE TABLET BY MOUTH ONCE A DAY   sitaGLIPtin  (JANUVIA ) 50 MG tablet Take 1 tablet (50 mg total) by mouth daily.   [DISCONTINUED] donepezil  (ARICEPT ) 5 MG tablet TAKE ONE TABLET BY MOUTH ONE TIME DAILY   No facility-administered encounter medications on file as  of 09/21/2023.       04/11/2023    3:09 PM 03/16/2023    3:00 PM 08/23/2022    4:00 PM  MMSE - Mini Mental State Exam  Not completed: Unable to complete    Orientation to time  3 4  Orientation to Place  5 5  Registration  3 3  Attention/ Calculation  2 0  Recall  1 0  Language- name 2 objects  2 1  Language- repeat  1 1  Language- follow 3 step command  3 3  Language- read & follow direction  1 1  Write a sentence  1 1  Copy design  0 0  Total score  22 19      05/18/2022   10:00 AM 05/16/2022    7:00 PM  Montreal Cognitive Assessment   Visuospatial/ Executive (0/5) 2 1  Naming (0/3) 3 3  Attention: Read list of digits (0/2) 2 2  Attention: Read list of letters (0/1) 1 1  Attention: Serial 7 subtraction starting at 100 (0/3) 1 1  Language: Repeat phrase (0/2) 1 1  Language : Fluency (0/1) 1 1  Abstraction (0/2) 1 1  Delayed Recall (0/5) 1 1  Orientation (0/6) 5 6  Total 18 18  Adjusted Score (based on education) 18 18    Objective:     PHYSICAL EXAMINATION:    VITALS:   Vitals:   09/21/23 1434  BP: 136/74  Pulse: 76  Resp: 20  SpO2: 99%  Weight: 99 lb (44.9 kg)  Height: 5' 1 (1.549 m)    GEN:  The patient appears stated age and is in NAD. HEENT:  Normocephalic, atraumatic.   Neurological examination:  General: NAD, well-groomed,  appears stated age. Orientation: The patient is alert. Oriented to person, place and not to date Cranial nerves: There is good facial symmetry.The speech not as fluent, but clear. No aphasia or dysarthria. Fund of knowledge is appropriate. Recent and remote memory are impaired. Attention and concentration are reduced. Able to name objects and repeat phrases.  Hearing is intact to conversational tone.   Sensation: Sensation is intact to light touch throughout Motor: Strength is at least antigravity x4. DTR's 2/4 in UE/LE     Movement examination: Tone: There is normal tone in the UE/LE Abnormal movements:  no tremor.  No myoclonus.  No asterixis.   Coordination:  There is decremation with RAM's on the LUE. Normal finger to nose  Gait and Station: The patient has no difficulty arising out of a deep-seated chair without the use of the hands. The patient's stride length is good.  Gait is cautious and narrow.    Thank you for allowing us  the opportunity to participate in the care of this nice patient. Please do not hesitate to contact us  for any questions or concerns.   Total time spent on today's visit was 23 minutes dedicated to this patient today, preparing to see patient, examining the patient, ordering tests and/or medications and counseling the patient, documenting clinical information in the EHR or other health record, independently interpreting results and communicating results to the patient/family, discussing treatment and goals, answering patient's questions and coordinating care.  Cc:  Katrinka Garnette KIDD, MD  Camie Sevin 09/21/2023 6:58 PM

## 2023-09-22 DIAGNOSIS — E785 Hyperlipidemia, unspecified: Secondary | ICD-10-CM | POA: Diagnosis not present

## 2023-09-22 DIAGNOSIS — I700 Atherosclerosis of aorta: Secondary | ICD-10-CM | POA: Diagnosis not present

## 2023-09-22 DIAGNOSIS — S0101XD Laceration without foreign body of scalp, subsequent encounter: Secondary | ICD-10-CM | POA: Diagnosis not present

## 2023-09-22 DIAGNOSIS — M47812 Spondylosis without myelopathy or radiculopathy, cervical region: Secondary | ICD-10-CM | POA: Diagnosis not present

## 2023-09-22 DIAGNOSIS — E119 Type 2 diabetes mellitus without complications: Secondary | ICD-10-CM | POA: Diagnosis not present

## 2023-09-22 DIAGNOSIS — F325 Major depressive disorder, single episode, in full remission: Secondary | ICD-10-CM | POA: Diagnosis not present

## 2023-09-22 DIAGNOSIS — M4802 Spinal stenosis, cervical region: Secondary | ICD-10-CM | POA: Diagnosis not present

## 2023-09-22 DIAGNOSIS — F0393 Unspecified dementia, unspecified severity, with mood disturbance: Secondary | ICD-10-CM | POA: Diagnosis not present

## 2023-09-22 DIAGNOSIS — E559 Vitamin D deficiency, unspecified: Secondary | ICD-10-CM | POA: Diagnosis not present

## 2023-09-27 DIAGNOSIS — I700 Atherosclerosis of aorta: Secondary | ICD-10-CM | POA: Diagnosis not present

## 2023-09-27 DIAGNOSIS — F325 Major depressive disorder, single episode, in full remission: Secondary | ICD-10-CM | POA: Diagnosis not present

## 2023-09-27 DIAGNOSIS — S0101XD Laceration without foreign body of scalp, subsequent encounter: Secondary | ICD-10-CM | POA: Diagnosis not present

## 2023-09-27 DIAGNOSIS — F0393 Unspecified dementia, unspecified severity, with mood disturbance: Secondary | ICD-10-CM | POA: Diagnosis not present

## 2023-09-27 DIAGNOSIS — E119 Type 2 diabetes mellitus without complications: Secondary | ICD-10-CM | POA: Diagnosis not present

## 2023-09-27 DIAGNOSIS — E785 Hyperlipidemia, unspecified: Secondary | ICD-10-CM | POA: Diagnosis not present

## 2023-09-27 DIAGNOSIS — M47812 Spondylosis without myelopathy or radiculopathy, cervical region: Secondary | ICD-10-CM | POA: Diagnosis not present

## 2023-09-27 DIAGNOSIS — E559 Vitamin D deficiency, unspecified: Secondary | ICD-10-CM | POA: Diagnosis not present

## 2023-09-27 DIAGNOSIS — M4802 Spinal stenosis, cervical region: Secondary | ICD-10-CM | POA: Diagnosis not present

## 2023-09-28 DIAGNOSIS — F0393 Unspecified dementia, unspecified severity, with mood disturbance: Secondary | ICD-10-CM | POA: Diagnosis not present

## 2023-09-28 DIAGNOSIS — I700 Atherosclerosis of aorta: Secondary | ICD-10-CM | POA: Diagnosis not present

## 2023-09-28 DIAGNOSIS — E785 Hyperlipidemia, unspecified: Secondary | ICD-10-CM | POA: Diagnosis not present

## 2023-09-28 DIAGNOSIS — F325 Major depressive disorder, single episode, in full remission: Secondary | ICD-10-CM | POA: Diagnosis not present

## 2023-09-28 DIAGNOSIS — E119 Type 2 diabetes mellitus without complications: Secondary | ICD-10-CM | POA: Diagnosis not present

## 2023-09-28 DIAGNOSIS — E559 Vitamin D deficiency, unspecified: Secondary | ICD-10-CM | POA: Diagnosis not present

## 2023-09-28 DIAGNOSIS — M47812 Spondylosis without myelopathy or radiculopathy, cervical region: Secondary | ICD-10-CM | POA: Diagnosis not present

## 2023-09-28 DIAGNOSIS — M4802 Spinal stenosis, cervical region: Secondary | ICD-10-CM | POA: Diagnosis not present

## 2023-09-28 DIAGNOSIS — S0101XD Laceration without foreign body of scalp, subsequent encounter: Secondary | ICD-10-CM | POA: Diagnosis not present

## 2023-10-03 ENCOUNTER — Other Ambulatory Visit: Payer: Self-pay | Admitting: Physician Assistant

## 2023-10-03 ENCOUNTER — Telehealth: Payer: Self-pay | Admitting: Family Medicine

## 2023-10-03 NOTE — Telephone Encounter (Signed)
 Received faxed document Home Health Certificate (Order ID [id number]), to be filled out by provider. Patient requested to send it back via Fax . Document is located in providers tray at front office.Please advise

## 2023-10-04 DIAGNOSIS — E559 Vitamin D deficiency, unspecified: Secondary | ICD-10-CM | POA: Diagnosis not present

## 2023-10-04 DIAGNOSIS — S0101XD Laceration without foreign body of scalp, subsequent encounter: Secondary | ICD-10-CM | POA: Diagnosis not present

## 2023-10-04 DIAGNOSIS — M4802 Spinal stenosis, cervical region: Secondary | ICD-10-CM | POA: Diagnosis not present

## 2023-10-04 DIAGNOSIS — E119 Type 2 diabetes mellitus without complications: Secondary | ICD-10-CM | POA: Diagnosis not present

## 2023-10-04 DIAGNOSIS — M47812 Spondylosis without myelopathy or radiculopathy, cervical region: Secondary | ICD-10-CM | POA: Diagnosis not present

## 2023-10-04 DIAGNOSIS — I700 Atherosclerosis of aorta: Secondary | ICD-10-CM | POA: Diagnosis not present

## 2023-10-04 DIAGNOSIS — F0393 Unspecified dementia, unspecified severity, with mood disturbance: Secondary | ICD-10-CM | POA: Diagnosis not present

## 2023-10-04 DIAGNOSIS — F325 Major depressive disorder, single episode, in full remission: Secondary | ICD-10-CM | POA: Diagnosis not present

## 2023-10-04 DIAGNOSIS — E785 Hyperlipidemia, unspecified: Secondary | ICD-10-CM | POA: Diagnosis not present

## 2023-10-04 NOTE — Telephone Encounter (Signed)
 Form completed and faxed back

## 2023-10-05 DIAGNOSIS — S0101XD Laceration without foreign body of scalp, subsequent encounter: Secondary | ICD-10-CM | POA: Diagnosis not present

## 2023-10-05 DIAGNOSIS — I700 Atherosclerosis of aorta: Secondary | ICD-10-CM | POA: Diagnosis not present

## 2023-10-05 DIAGNOSIS — M47812 Spondylosis without myelopathy or radiculopathy, cervical region: Secondary | ICD-10-CM | POA: Diagnosis not present

## 2023-10-05 DIAGNOSIS — E559 Vitamin D deficiency, unspecified: Secondary | ICD-10-CM | POA: Diagnosis not present

## 2023-10-05 DIAGNOSIS — F0393 Unspecified dementia, unspecified severity, with mood disturbance: Secondary | ICD-10-CM | POA: Diagnosis not present

## 2023-10-05 DIAGNOSIS — E785 Hyperlipidemia, unspecified: Secondary | ICD-10-CM | POA: Diagnosis not present

## 2023-10-05 DIAGNOSIS — E119 Type 2 diabetes mellitus without complications: Secondary | ICD-10-CM | POA: Diagnosis not present

## 2023-10-05 DIAGNOSIS — M4802 Spinal stenosis, cervical region: Secondary | ICD-10-CM | POA: Diagnosis not present

## 2023-10-05 DIAGNOSIS — F325 Major depressive disorder, single episode, in full remission: Secondary | ICD-10-CM | POA: Diagnosis not present

## 2023-10-10 ENCOUNTER — Encounter: Payer: Self-pay | Admitting: Family Medicine

## 2023-10-10 ENCOUNTER — Ambulatory Visit: Admitting: Family Medicine

## 2023-10-10 VITALS — BP 100/70 | HR 96 | Temp 98.0°F | Ht 61.0 in | Wt 93.0 lb

## 2023-10-10 DIAGNOSIS — R32 Unspecified urinary incontinence: Secondary | ICD-10-CM

## 2023-10-10 DIAGNOSIS — E1165 Type 2 diabetes mellitus with hyperglycemia: Secondary | ICD-10-CM

## 2023-10-10 DIAGNOSIS — Z7984 Long term (current) use of oral hypoglycemic drugs: Secondary | ICD-10-CM | POA: Diagnosis not present

## 2023-10-10 DIAGNOSIS — F0390 Unspecified dementia without behavioral disturbance: Secondary | ICD-10-CM | POA: Diagnosis not present

## 2023-10-10 NOTE — Patient Instructions (Addendum)
 Glad they have increased donepezil - we considered increasing sertraline  as well to see if that could help with agitation but wanted to make one change at a time so hold off today  We have placed a referral for you today to [facility] for dementia support- please call their # if you do not hear within a week (may be listed below or you may see mychart message within a few days with #).   Please stop by lab before you go If you have mychart- we will send your results within 3 business days of us  receiving them.  If you do not have mychart- we will call you about results within 5 business days of us  receiving them.  *please also note that you will see labs on mychart as soon as they post. I will later go in and write notes on them- will say notes from Dr. Katrinka   Recommended follow up: Return in about 1 month (around 11/10/2023) for followup or sooner if needed.Schedule b4 you leave.

## 2023-10-10 NOTE — Progress Notes (Signed)
 Phone [phone] In person visit   Subjective:   Gabrielle Robinson is a 83 y.o. year old very pleasant female patient who presents for/with See problem oriented charting Chief Complaint  Patient presents with   1 month f/u    Pt son states pt is getting worse and it out of control, she is now urinating on herself and defacating and everything is overwhelming.   Past Medical History-  Patient Active Problem List   Diagnosis Date Noted   History of cerebral infarction 05/12/2022    Priority: High   Major neurocognitive disorder (HCC) 05/12/2022    Priority: High   Diabetes mellitus (HCC) 04/01/2013    Priority: High   Vitamin D  deficiency 10/22/2021    Priority: Medium    Atherosclerosis of aorta (HCC) 07/27/2021    Priority: Medium    History of melanoma 07/27/2021    Priority: Medium    Major depressive disorder with single episode, in full remission (HCC) 05/28/2015    Priority: Medium    Hyperlipemia 01/05/2009    Priority: Medium    Bilateral shoulder pain 05/28/2015    Priority: Low    Medications- reviewed and updated Current Outpatient Medications  Medication Sig Dispense Refill   aspirin EC 81 MG tablet Take 81 mg by mouth daily. Swallow whole.     Cyanocobalamin  (VITAMIN B 12 PO) Take by mouth.     donepezil  (ARICEPT ) 10 MG tablet Take 1 tablet (10 mg total) by mouth daily. 90 tablet 3   Glucose Blood (BLOOD GLUCOSE TEST STRIPS) STRP 1 each by In Vitro route daily. May substitute to any manufacturer covered by patient's insurance. 90 each 3   Lancets Misc. MISC 1 each by Does not apply route daily. May substitute to any manufacturer covered by patient's insurance. 90 each 3   metFORMIN  (GLUCOPHAGE -XR) 500 MG 24 hr tablet Take 2 tablets (1,000 mg total) by mouth 2 (two) times daily with a meal. 360 tablet 3   Multiple Vitamin (MULTIVITAMIN) capsule Take 1 capsule by mouth daily.     Polyethyl Glycol-Propyl Glycol (SYSTANE ULTRA OP) Place 1 drop into both eyes daily  as needed (dry eyes).     rosuvastatin  (CRESTOR ) 10 MG tablet TAKE ONE TABLET BY MOUTH ONE TIME DAILY 90 tablet 0   sertraline  (ZOLOFT ) 25 MG tablet TAKE ONE TABLET BY MOUTH ONCE A DAY 30 tablet 0   sitaGLIPtin  (JANUVIA ) 50 MG tablet Take 1 tablet (50 mg total) by mouth daily. 90 tablet 3   No current facility-administered medications for this visit.     Objective:  BP 100/70   Pulse 96   Temp 98 F (36.7 C)   Ht 5' 1 (1.549 m)   Wt 93 lb (42.2 kg)   SpO2 96%   BMI 17.57 kg/m  Gen: NAD, resting comfortably CV: RRR no murmurs rubs or gallops Lungs: CTAB no crackles, wheeze, rhonchi Abdomen: soft/nontender/nondistended/normal bowel sounds.  Ext: no edema Skin: warm, dry Neuro: Answers minimal number of questions-defers most to her son    Assessment and Plan   # Social dan went back to work on Monday but he has been incredibly supportive while husband is in the hospital.  They have a caretaker for her but she does not love them.  Son is open to memory care to help her get the support she needs.  Thankfully husband is getting stronger at [facility] and should be home soon   #fall/ED follow-up-patient had a fall on 09/11/2023 onto her  head and had a hematoma-thankfully CT head with no acute intracranial abnormalities.  Arthritis in the neck but no fracture-though she does have some severe osseous neuroforaminal stenosis.  Rehab and nursing coming out - has declined occupational therapy- stressors for her.  No reported headaches  #major neurocognitive disorder- sees neurology Ivins S:Medication: donepazil 5 mg--> 10 mg just recently increased -January 2024 MRI-noted prior lacunar infarct-now on aspirin  Patient's son is present today and reports her situation is worsening.  Patient has both fecal and urinary incontinence and seems to be unaware of the situation-at appointment last month there was some improvements including improvement in incontinence from twice a day down  to 3 times a week which above is about her baseline-she is no longer simply sitting in the urine.  She was having bowel movements daily without incontinence at that point but was still having diarrhea-they were unable to collect stool sample due to unpredictability.  Unfortunately her husband has had a stroke and has been hospitalized and son has had a feeling in the caregiver role-patient has not done as well with this transition and seems to have backslid and as far as incontinence.  Ongoing loose stools at baseline.  He has a difficult time getting her to shower and even when she does shower he is not sure if she is actually bathing or just getting out of the water.  She will leave her underwear with stool in them in the shower.  She has stooled on the couches.  She is not wearing depends.  Son was not even aware of diagnosis of dementia so at neurology visit this was a big shock for him.  Patient seems more irritable at home and has waved scissors at caregiver-scissors were subsequently removed.  She is also apparently gotten somewhat physically aggressive with 1 caregiver.  Patient also leaves the house in disarray with close all over the place.  Multiple pairs of shoes that have no matching pair.  -Prior urine culture negative.  CBC was reassuring.  CMP with mild hyperglycemia, GFR in 50s, elevated BUN/creatinine ratio suggesting dehydration A/P: Patient with known dementia likely Alzheimer's-donepezil  recently increased.  We considered increasing sertraline  given she has had some agitation but we opted to make only 1 change at a time - A lot of the visit was spent investing in the son and explaining the diagnosis as well as expected trajectory - Offered updated brain MRI to look for something like normal pressure hydrocephalus but ultimately opted out jointly -Her husband is at [facility] skilled nursing facility but may be coming home as soon as Friday-I still think we need to consider memory care with  his decline in her steady decline -Referral to be [facility] as I think she is high risk for hospitalization -Recommended depends for incontinence and also gave some blue chuck pads as apparently furniture and mattress were getting messed up by her incontinence and encouraged to pick up more from the medical supply store or could likely order online   # Diabetes- sees Dr. Mercie- peak a1c of 10.7 in November 2022 off meds S: Medication:Metformin  1000 mg extended release  twice daily , januvia  50 mg added November 2024  Lab Results  Component Value Date   HGBA1C 7.2 (H) 08/31/2023   HGBA1C 7.1 (A) 06/07/2023   HGBA1C 8.8 (H) 03/28/2023   A/P: A1c under 8 with her dementia I think is a reasonable goal-continue current medications  #History of lacunar infarct #hyperlipidemia #aortic atherosclerosis - LDL goal 70  S: Medication:Pravastatin  40 mg--> rosuvastatin  10 mg, aspirin 81 mg  Lab Results  Component Value Date   CHOL 153 09/23/2022   HDL 68.90 09/23/2022   LDLCALC 64 09/23/2022   LDLDIRECT 93 01/28/2022   TRIG 103.0 09/23/2022   CHOLHDL 2 09/23/2022    A/P: Lipids at goal most recently-likely repeat with next blood work with LDL goal under 70 for aortic atherosclerosis and history of lacunar infarct   # 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 Sherrod  Recommended follow up: Return in about 1 month (around 11/10/2023) for followup or sooner if needed.Schedule b4 you leave. Future Appointments  Date Time Provider Department Center  11/13/2023  4:00 PM Katrinka Garnette KIDD, MD [facility]  03/19/2024  3:00 PM Dina Camie BRAVO, PA-C [facility] None  04/11/2024  2:00 PM Katrinka Garnette KIDD, MD [facility]  04/15/2024  3:00 PM [facility] ANNUAL WELLNESS VISIT 1 [facility]  05/30/2024 10:00 AM [facility] None  05/30/2024 10:30 AM Sherrod Sherrod, MD [facility] None    Lab/Order  associations:   ICD-10-CM   1. Type 2 diabetes mellitus with hyperglycemia, without long-term current use of insulin (HCC)  E11.65 Urine Microalbumin w/creat. ratio    Urine Culture    Urine Microalbumin w/creat. ratio    2. Urinary incontinence, unspecified type  R32 Urine Culture    Urine Culture    Urine Microalbumin w/creat. ratio    3. Major neurocognitive disorder (HCC)  F03.90 AMB Referral [facility] Care Management    CANCELED: AMB Referral [facility] Care Management      Time Spent: 41 minutes of total time (2:52 PM-3:33 PM) was spent on the date of the encounter performing the following actions: chart review prior to seeing the patient, obtaining history, performing a medically necessary exam, counseling on the treatment plan and explaining dementia and incontinence in greater depth to sign as well as memory care option, placing orders, and documenting in our EHR.    Return precautions advised.  Garnette Katrinka, MD

## 2023-10-11 ENCOUNTER — Telehealth: Payer: Self-pay | Admitting: *Deleted

## 2023-10-11 DIAGNOSIS — M47812 Spondylosis without myelopathy or radiculopathy, cervical region: Secondary | ICD-10-CM | POA: Diagnosis not present

## 2023-10-11 DIAGNOSIS — M4802 Spinal stenosis, cervical region: Secondary | ICD-10-CM | POA: Diagnosis not present

## 2023-10-11 DIAGNOSIS — E559 Vitamin D deficiency, unspecified: Secondary | ICD-10-CM | POA: Diagnosis not present

## 2023-10-11 DIAGNOSIS — I700 Atherosclerosis of aorta: Secondary | ICD-10-CM | POA: Diagnosis not present

## 2023-10-11 DIAGNOSIS — E119 Type 2 diabetes mellitus without complications: Secondary | ICD-10-CM | POA: Diagnosis not present

## 2023-10-11 DIAGNOSIS — F325 Major depressive disorder, single episode, in full remission: Secondary | ICD-10-CM | POA: Diagnosis not present

## 2023-10-11 DIAGNOSIS — F0393 Unspecified dementia, unspecified severity, with mood disturbance: Secondary | ICD-10-CM | POA: Diagnosis not present

## 2023-10-11 DIAGNOSIS — S0101XD Laceration without foreign body of scalp, subsequent encounter: Secondary | ICD-10-CM | POA: Diagnosis not present

## 2023-10-11 DIAGNOSIS — E785 Hyperlipidemia, unspecified: Secondary | ICD-10-CM | POA: Diagnosis not present

## 2023-10-11 NOTE — Progress Notes (Unsigned)
 Complex Care Management Note  Care Guide Note 10/11/2023 Name: Gabrielle Robinson MRN: [id number] DOB: Aug 01, 1940  [name] is a 83 y.o. year old female who sees Katrinka, Garnette KIDD, MD for primary care. I reached out to [name] by phone today to offer complex care management services.  Ms. Rogness was given information about Complex Care Management services today including:   The Complex Care Management services include support from the care team which includes your Nurse Care Manager, Clinical Social Worker, or Pharmacist.  The Complex Care Management team is here to help remove barriers to the health concerns and goals most important to you. Complex Care Management services are voluntary, and the patient may decline or stop services at any time by request to their care team member.   Complex Care Management Consent Status: Patient agreed to services and verbal consent obtained.   Follow up plan:  Telephone appointment with complex care management team member scheduled for:  7/21  Encounter Outcome:  Patient Scheduled . Harlene Satterfield  [facility]  [facility], [facility] Guide  Direct Dial: [phone]  Fax [phone]

## 2023-10-11 NOTE — Progress Notes (Unsigned)
 Complex Care Management Note Care Guide Note  10/11/2023 Name: Gabrielle Robinson MRN: [id number] DOB: 1941-02-20   Complex Care Management Outreach Attempts: An unsuccessful telephone outreach was attempted today to offer the patient information about available complex care management services.  Follow Up Plan:  Additional outreach attempts will be made to offer the patient complex care management information and services.   Encounter Outcome:  No Answer  Harlene Satterfield  [facility]  [facility], [facility] Guide  Direct Dial: [phone]  Fax [phone]

## 2023-10-14 DIAGNOSIS — S0101XD Laceration without foreign body of scalp, subsequent encounter: Secondary | ICD-10-CM | POA: Diagnosis not present

## 2023-10-14 DIAGNOSIS — F325 Major depressive disorder, single episode, in full remission: Secondary | ICD-10-CM | POA: Diagnosis not present

## 2023-10-14 DIAGNOSIS — I700 Atherosclerosis of aorta: Secondary | ICD-10-CM | POA: Diagnosis not present

## 2023-10-14 DIAGNOSIS — M47812 Spondylosis without myelopathy or radiculopathy, cervical region: Secondary | ICD-10-CM | POA: Diagnosis not present

## 2023-10-14 DIAGNOSIS — E559 Vitamin D deficiency, unspecified: Secondary | ICD-10-CM | POA: Diagnosis not present

## 2023-10-14 DIAGNOSIS — M4802 Spinal stenosis, cervical region: Secondary | ICD-10-CM | POA: Diagnosis not present

## 2023-10-14 DIAGNOSIS — E119 Type 2 diabetes mellitus without complications: Secondary | ICD-10-CM | POA: Diagnosis not present

## 2023-10-14 DIAGNOSIS — E785 Hyperlipidemia, unspecified: Secondary | ICD-10-CM | POA: Diagnosis not present

## 2023-10-14 DIAGNOSIS — F0393 Unspecified dementia, unspecified severity, with mood disturbance: Secondary | ICD-10-CM | POA: Diagnosis not present

## 2023-10-16 ENCOUNTER — Other Ambulatory Visit: Payer: Self-pay | Admitting: Licensed Clinical Social Worker

## 2023-10-16 NOTE — Patient Instructions (Signed)
 Visit Information  Thank you for taking time to visit with me today. Please don't hesitate to contact me if I can be of assistance to you before our next scheduled appointment.  Our next appointment is by telephone on 10/30/2023  Please call the care guide team at [phone] if you need to cancel or reschedule your appointment.   Following is a copy of your care plan:   Goals Addressed             This Visit's Progress    [facility] Social Work Care Plan       Problems:   Care Coordination needs related to Dementia: Dementia with mood disturbances and Level of Care Concerns:Facility placement (Family is considering placement options)  CSW Clinical Goal(s):   Over the next 6 weeks the Patient will work with Child psychotherapist to address concerns related to dementia care and placement options as evidenced by chart review.  Interventions:  Dementia Care:   Current level of care: Home with other family or significant other(s): spouse caregiver  Evaluation of patient safety in current living environment and review of Dementia resources and support (home with family, hired caregiver during the week) ADL's Assessed needs, level of care concerns, how currently meeting needs and barriers to care Problem [facility] strategies reviewed Facility  Assessed needs and reviewed facility placement process; as well as the different levels of care Discussed Special Assistance Medicaid Emotional Support Provided Problem [facility] strategies reviewed Provided a list of facilities based on level of care needs  Patient Goals/Self-Care Activities:  Review educational material on dementia care community resources and facility placement options.  Plan:   Telephone follow up appointment with care management team member scheduled for:  10/30/2023        Please call the Suicide and Crisis Lifeline: 988 if you are experiencing a Mental Health or Behavioral Health Crisis or need someone to  talk to.  Patient verbalizes understanding of instructions and care plan provided today and agrees to view in MyChart. Active MyChart status and patient understanding of how to access instructions and care plan via MyChart confirmed with patient.     Alm Armor, LCSW [facility], [facility] Licensed Clinical Social Worker Care Coordinator [phone]

## 2023-10-16 NOTE — Patient Outreach (Signed)
 Complex Care Management   Visit Note  10/16/2023  Name:  Gabrielle Robinson MRN: [id number] DOB: 11-Aug-1940  Situation: Referral received for Complex Care Management related to Dementia I obtained verbal consent from Caregiver.  Visit completed with caregiver  on the phone  Background:   Past Medical History:  Diagnosis Date   Cancer [facility])    Left Hip Melanoma   Diabetes mellitus without complication (HCC)    Hyperlipemia 01/05/2009   Qualifier: Diagnosis of  By: [name], Norleen BRAVO    Major depressive disorder with single episode, in full remission (HCC) 05/28/2015    Assessment: Patient Reported Symptoms:  Cognitive Cognitive Status: Able to follow simple commands, Confused or disoriented, Difficulties with attention and concentration, Requires Assistance Decision Making Cognitive/Intellectual Conditions Management [RPT]: None reported or documented in medical history or problem list   Health Maintenance Behaviors: Annual physical exam Healing Pattern: Unsure  Neurological Neurological Review of Symptoms: No symptoms reported Neurological Management Strategies: Coping strategies Neurological Self-Management Outcome: 3 (uncertain) Neurological Comment: Major Neurocognitive Disorder  HEENT HEENT Symptoms Reported: No symptoms reported      Cardiovascular Cardiovascular Symptoms Reported: No symptoms reported Cardiovascular Management Strategies: Routine screening, Medication therapy  Respiratory Respiratory Symptoms Reported: No symptoms reported Respiratory Management Strategies: Routine screening  Endocrine Endocrine Symptoms Reported: No symptoms reported    Gastrointestinal Gastrointestinal Symptoms Reported: No symptoms reported      Genitourinary Genitourinary Symptoms Reported: No symptoms reported    Integumentary Integumentary Symptoms Reported: No symptoms reported    Musculoskeletal Musculoskelatal Symptoms Reviewed: No symptoms reported        Psychosocial  Psychosocial Symptoms Reported: No symptoms reported     Quality of Family Relationships: helpful, involved, supportive Do you feel physically threatened by others?: No      10/10/2023    2:51 PM  Depression screen PHQ 2/9  Decreased Interest 0  Down, Depressed, Hopeless 0  PHQ - 2 Score 0  Altered sleeping 0  Tired, decreased energy 0  Change in appetite 0  Feeling bad or failure about yourself  0  Trouble concentrating 0  Moving slowly or fidgety/restless 0  Suicidal thoughts 0  PHQ-9 Score 0  Difficult doing work/chores Not difficult at all    There were no vitals filed for this visit.  Medications Reviewed Today     Reviewed by Kit Alm LABOR, LCSW (Social Worker) on 10/16/23 at 1036  Med List Status: <None>   Medication Order Taking? Sig Documenting Provider Last Dose Status Informant  aspirin EC 81 MG tablet [id number] Yes Take 81 mg by mouth daily. Swallow whole. [provider]  Active   Cyanocobalamin  (VITAMIN B 12 PO) [id number] Yes Take by mouth. [provider]  Active   donepezil  (ARICEPT ) 10 MG tablet [id number] Yes Take 1 tablet (10 mg total) by mouth daily. Wertman, Sara E, PA-C  Active   Glucose Blood (BLOOD GLUCOSE TEST STRIPS) STRP [id number] Yes 1 each by In Vitro route daily. May substitute to any manufacturer covered by patient's insurance. Thapa, [place], MD  Active   Lancets Misc. MISC [id number] Yes 1 each by Does not apply route daily. May substitute to any manufacturer covered by patient's insurance. Thapa, [place], MD  Active   metFORMIN  (GLUCOPHAGE -XR) 500 MG 24 hr tablet [id number] Yes Take 2 tablets (1,000 mg total) by mouth 2 (two) times daily with a meal. Thapa, [place], MD  Active   Multiple Vitamin (MULTIVITAMIN) capsule [id number] Yes Take 1 capsule by mouth daily. [provider]  Active   Polyethyl Glycol-Propyl Glycol (SYSTANE ULTRA OP) [id number]  Place 1 drop into both eyes daily as needed (dry eyes). [provider]   Active Self  rosuvastatin  (CRESTOR ) 10 MG tablet [id number] Yes TAKE ONE TABLET BY MOUTH ONE TIME DAILY Katrinka Garnette KIDD, MD  Active   sertraline  (ZOLOFT ) 25 MG tablet [id number] Yes TAKE ONE TABLET BY MOUTH ONCE A DAY Katrinka Garnette KIDD, MD  Active   sitaGLIPtin  (JANUVIA ) 50 MG tablet [id number] Yes Take 1 tablet (50 mg total) by mouth daily. Thapa, [place], MD  Active             Recommendation:   Review educational material on dementia care community resources and facility placement options   Follow Up Plan:   Telephone follow up appointment date/time:  10/30/2023  Alm Armor, LCSW [place], [facility] Licensed Clinical Social Worker Care Coordinator [phone]

## 2023-10-19 ENCOUNTER — Telehealth: Payer: Self-pay

## 2023-10-19 DIAGNOSIS — E1165 Type 2 diabetes mellitus with hyperglycemia: Secondary | ICD-10-CM

## 2023-10-19 MED ORDER — METFORMIN HCL ER 500 MG PO TB24: 1000.0000 mg | ORAL_TABLET | Freq: Two times a day (BID) | ORAL | 3 refills | Status: AC

## 2023-10-19 NOTE — Telephone Encounter (Signed)
 Rx sent again to [facility].  Copied from CRM #[id number]. Topic: Clinical - Prescription Issue >> Oct 19, 2023  2:53 PM Gabrielle Robinson wrote: Reason for CRM: Patient has 3 refills of metFORMIN  (GLUCOPHAGE -XR) 500 MG 24 hr tablet available but when her son went to get refilled they stated that they have no record of this prescription.   [id number] - [place], [place] - [address]

## 2023-10-20 DIAGNOSIS — F0393 Unspecified dementia, unspecified severity, with mood disturbance: Secondary | ICD-10-CM | POA: Diagnosis not present

## 2023-10-20 DIAGNOSIS — M4802 Spinal stenosis, cervical region: Secondary | ICD-10-CM | POA: Diagnosis not present

## 2023-10-20 DIAGNOSIS — I700 Atherosclerosis of aorta: Secondary | ICD-10-CM | POA: Diagnosis not present

## 2023-10-20 DIAGNOSIS — M47812 Spondylosis without myelopathy or radiculopathy, cervical region: Secondary | ICD-10-CM | POA: Diagnosis not present

## 2023-10-20 DIAGNOSIS — E559 Vitamin D deficiency, unspecified: Secondary | ICD-10-CM | POA: Diagnosis not present

## 2023-10-20 DIAGNOSIS — E785 Hyperlipidemia, unspecified: Secondary | ICD-10-CM | POA: Diagnosis not present

## 2023-10-20 DIAGNOSIS — E119 Type 2 diabetes mellitus without complications: Secondary | ICD-10-CM | POA: Diagnosis not present

## 2023-10-20 DIAGNOSIS — S0101XD Laceration without foreign body of scalp, subsequent encounter: Secondary | ICD-10-CM | POA: Diagnosis not present

## 2023-10-20 DIAGNOSIS — F325 Major depressive disorder, single episode, in full remission: Secondary | ICD-10-CM | POA: Diagnosis not present

## 2023-10-25 DIAGNOSIS — F325 Major depressive disorder, single episode, in full remission: Secondary | ICD-10-CM | POA: Diagnosis not present

## 2023-10-25 DIAGNOSIS — E559 Vitamin D deficiency, unspecified: Secondary | ICD-10-CM | POA: Diagnosis not present

## 2023-10-25 DIAGNOSIS — M4802 Spinal stenosis, cervical region: Secondary | ICD-10-CM | POA: Diagnosis not present

## 2023-10-25 DIAGNOSIS — E785 Hyperlipidemia, unspecified: Secondary | ICD-10-CM | POA: Diagnosis not present

## 2023-10-25 DIAGNOSIS — M47812 Spondylosis without myelopathy or radiculopathy, cervical region: Secondary | ICD-10-CM | POA: Diagnosis not present

## 2023-10-25 DIAGNOSIS — S0101XD Laceration without foreign body of scalp, subsequent encounter: Secondary | ICD-10-CM | POA: Diagnosis not present

## 2023-10-25 DIAGNOSIS — F0393 Unspecified dementia, unspecified severity, with mood disturbance: Secondary | ICD-10-CM | POA: Diagnosis not present

## 2023-10-25 DIAGNOSIS — I700 Atherosclerosis of aorta: Secondary | ICD-10-CM | POA: Diagnosis not present

## 2023-10-25 DIAGNOSIS — E119 Type 2 diabetes mellitus without complications: Secondary | ICD-10-CM | POA: Diagnosis not present

## 2023-10-30 ENCOUNTER — Other Ambulatory Visit: Payer: Self-pay | Admitting: Family Medicine

## 2023-10-30 ENCOUNTER — Other Ambulatory Visit: Payer: Self-pay | Admitting: Licensed Clinical Social Worker

## 2023-10-30 NOTE — Patient Outreach (Signed)
 Complex Care Management   Visit Note  10/30/2023  Name:  Gabrielle Robinson MRN: [id number] DOB: 1940-12-23  Situation: Referral received for Complex Care Management related to Dementia I obtained verbal consent from Caregiver.  Visit completed with caregiver  on the phone  Background:   Past Medical History:  Diagnosis Date   Cancer [facility])    Left Hip Melanoma   Diabetes mellitus without complication (HCC)    Hyperlipemia 01/05/2009   Qualifier: Diagnosis of  By: [name], Norleen BRAVO    Major depressive disorder with single episode, in full remission (HCC) 05/28/2015    Assessment: Patient Reported Symptoms:  Cognitive Cognitive Status: Able to follow simple commands, Confused or disoriented, Difficulties with attention and concentration, Requires Assistance Decision Making Cognitive/Intellectual Conditions Management [RPT]: None reported or documented in medical history or problem list   Health Maintenance Behaviors: Annual physical exam Healing Pattern: Unsure  Neurological Neurological Review of Symptoms: No symptoms reported Neurological Management Strategies: Coping strategies Neurological Self-Management Outcome: 3 (uncertain) Neurological Comment: Major Neurocognitive Disorder  HEENT HEENT Symptoms Reported: No symptoms reported      Cardiovascular Cardiovascular Symptoms Reported: No symptoms reported Cardiovascular Management Strategies: Routine screening, Medication therapy  Respiratory Respiratory Symptoms Reported: No symptoms reported Respiratory Management Strategies: Routine screening  Endocrine Endocrine Symptoms Reported: No symptoms reported    Gastrointestinal Gastrointestinal Symptoms Reported: No symptoms reported      Genitourinary Genitourinary Symptoms Reported: No symptoms reported    Integumentary Integumentary Symptoms Reported: No symptoms reported    Musculoskeletal Musculoskelatal Symptoms Reviewed: No symptoms reported        Psychosocial  Psychosocial Symptoms Reported: No symptoms reported            10/10/2023    2:51 PM  Depression screen PHQ 2/9  Decreased Interest 0  Down, Depressed, Hopeless 0  PHQ - 2 Score 0  Altered sleeping 0  Tired, decreased energy 0  Change in appetite 0  Feeling bad or failure about yourself  0  Trouble concentrating 0  Moving slowly or fidgety/restless 0  Suicidal thoughts 0  PHQ-9 Score 0  Difficult doing work/chores Not difficult at all    There were no vitals filed for this visit.  Medications Reviewed Today     Reviewed by Kit Alm LABOR, LCSW (Social Worker) on 10/30/23 at 1148  Med List Status: <None>   Medication Order Taking? Sig Documenting Provider Last Dose Status Informant  aspirin EC 81 MG tablet [id number] Yes Take 81 mg by mouth daily. Swallow whole. [provider]  Active   Cyanocobalamin  (VITAMIN B 12 PO) [id number] Yes Take by mouth. [provider]  Active   donepezil  (ARICEPT ) 10 MG tablet [id number] Yes Take 1 tablet (10 mg total) by mouth daily. Wertman, Sara E, PA-C  Active   Glucose Blood (BLOOD GLUCOSE TEST STRIPS) STRP [id number] Yes 1 each by In Vitro route daily. May substitute to any manufacturer covered by patient's insurance. Thapa, [place], MD  Active   Lancets Misc. MISC [id number] Yes 1 each by Does not apply route daily. May substitute to any manufacturer covered by patient's insurance. Thapa, [place], MD  Active   metFORMIN  (GLUCOPHAGE -XR) 500 MG 24 hr tablet [id number] Yes Take 2 tablets (1,000 mg total) by mouth 2 (two) times daily with a meal. Katrinka Garnette KIDD, MD  Active   Multiple Vitamin (MULTIVITAMIN) capsule [id number] Yes Take 1 capsule by mouth daily. [provider]  Active   Polyethyl Glycol-Propyl Glycol (SYSTANE ULTRA OP)  [id number]  Place 1 drop into both eyes daily as needed (dry eyes). [provider]  Active Self  rosuvastatin  (CRESTOR ) 10 MG tablet [id number] Yes TAKE ONE TABLET BY MOUTH ONE TIME DAILY  Katrinka Garnette KIDD, MD  Active   sertraline  (ZOLOFT ) 25 MG tablet [id number] Yes TAKE ONE TABLET BY MOUTH ONCE A DAY Katrinka Garnette KIDD, MD  Active   sitaGLIPtin  (JANUVIA ) 50 MG tablet [id number] Yes Take 1 tablet (50 mg total) by mouth daily. Thapa, [place], MD  Active             Recommendation:   CSW spoke with pt's caregiver - he reports that they have found a in home caregiver who is able to work well with patient. The patient trusts this caregiver for baths and this has helped with the overall caregiver stress. CSW and caregiver discussed other caregiver resources including support groups and caregiver agreed to consider these. Caregiver reported they are going to maintain current level of care for now - they agreed to further CSW follow up.  Follow Up Plan:   Telephone follow up appointment date/time:  11/30/2023  Alm Armor, LCSW [place], [facility] Licensed Clinical Social Worker Care Coordinator [phone]

## 2023-10-30 NOTE — Patient Instructions (Signed)
 Visit Information  Thank you for taking time to visit with me today. Please don't hesitate to contact me if I can be of assistance to you before our next scheduled appointment.  Your next care management appointment is by telephone on 11/30/2023    Please call the care guide team at [phone] if you need to cancel, schedule, or reschedule an appointment.   Please call the Suicide and Crisis Lifeline: 988 if you are experiencing a Mental Health or Behavioral Health Crisis or need someone to talk to.  Alm Armor, LCSW [place], [facility] Licensed Clinical Social Worker Care Coordinator [phone]

## 2023-11-01 ENCOUNTER — Ambulatory Visit: Admitting: Physician Assistant

## 2023-11-01 DIAGNOSIS — E119 Type 2 diabetes mellitus without complications: Secondary | ICD-10-CM | POA: Diagnosis not present

## 2023-11-01 DIAGNOSIS — M4802 Spinal stenosis, cervical region: Secondary | ICD-10-CM | POA: Diagnosis not present

## 2023-11-01 DIAGNOSIS — I700 Atherosclerosis of aorta: Secondary | ICD-10-CM | POA: Diagnosis not present

## 2023-11-01 DIAGNOSIS — S0101XD Laceration without foreign body of scalp, subsequent encounter: Secondary | ICD-10-CM | POA: Diagnosis not present

## 2023-11-01 DIAGNOSIS — F0393 Unspecified dementia, unspecified severity, with mood disturbance: Secondary | ICD-10-CM | POA: Diagnosis not present

## 2023-11-01 DIAGNOSIS — E785 Hyperlipidemia, unspecified: Secondary | ICD-10-CM | POA: Diagnosis not present

## 2023-11-01 DIAGNOSIS — M47812 Spondylosis without myelopathy or radiculopathy, cervical region: Secondary | ICD-10-CM | POA: Diagnosis not present

## 2023-11-01 DIAGNOSIS — E559 Vitamin D deficiency, unspecified: Secondary | ICD-10-CM | POA: Diagnosis not present

## 2023-11-01 DIAGNOSIS — F325 Major depressive disorder, single episode, in full remission: Secondary | ICD-10-CM | POA: Diagnosis not present

## 2023-11-13 ENCOUNTER — Ambulatory Visit: Admitting: Family Medicine

## 2023-11-15 ENCOUNTER — Other Ambulatory Visit: Payer: Self-pay | Admitting: Family Medicine

## 2023-11-30 ENCOUNTER — Other Ambulatory Visit: Payer: Self-pay | Admitting: Licensed Clinical Social Worker

## 2023-11-30 NOTE — Patient Outreach (Signed)
 Complex Care Management   Visit Note  11/30/2023  Name:  Gabrielle Robinson MRN: [id number] DOB: 02-27-1941  Situation: Referral received for Complex Care Management related to Dementia I obtained verbal consent from Caregiver.  Visit completed with Caregiver  on the phone  Background:   Past Medical History:  Diagnosis Date   Cancer (HCC)    Left Hip Melanoma   Diabetes mellitus without complication (HCC)    Hyperlipemia 01/05/2009   Qualifier: Diagnosis of  By: [name], Norleen BRAVO    Major depressive disorder with single episode, in full remission (HCC) 05/28/2015    Assessment: Patient Reported Symptoms:  Cognitive Cognitive Status: Able to follow simple commands, Confused or disoriented, Difficulties with attention and concentration, Requires Assistance Decision Making Cognitive/Intellectual Conditions Management [RPT]: None reported or documented in medical history or problem list   Health Maintenance Behaviors: Annual physical exam Healing Pattern: Unsure  Neurological Neurological Review of Symptoms: No symptoms reported Neurological Management Strategies: Coping strategies Neurological Self-Management Outcome: 3 (uncertain) Neurological Comment: Major NEurocognitive disorder  HEENT HEENT Symptoms Reported: No symptoms reported      Cardiovascular Cardiovascular Symptoms Reported: No symptoms reported Cardiovascular Management Strategies: Routine screening, Medication therapy  Respiratory Respiratory Symptoms Reported: No symptoms reported Respiratory Management Strategies: Routine screening  Endocrine Endocrine Symptoms Reported: No symptoms reported    Gastrointestinal Gastrointestinal Symptoms Reported: No symptoms reported      Genitourinary Genitourinary Symptoms Reported: No symptoms reported    Integumentary Integumentary Symptoms Reported: No symptoms reported    Musculoskeletal Musculoskelatal Symptoms Reviewed: No symptoms reported        Psychosocial  Psychosocial Symptoms Reported: No symptoms reported          11/30/2023    PHQ2-9 Depression Screening   Little interest or pleasure in doing things    Feeling down, depressed, or hopeless    PHQ-2 - Total Score    Trouble falling or staying asleep, or sleeping too much    Feeling tired or having little energy    Poor appetite or overeating     Feeling bad about yourself - or that you are a failure or have let yourself or your family down    Trouble concentrating on things, such as reading the newspaper or watching television    Moving or speaking so slowly that other people could have noticed.  Or the opposite - being so fidgety or restless that you have been moving around a lot more than usual    Thoughts that you would be better off dead, or hurting yourself in some way    PHQ2-9 Total Score    If you checked off any problems, how difficult have these problems made it for you to do your work, take care of things at home, or get along with other people    Depression Interventions/Treatment      There were no vitals filed for this visit.  Medications Reviewed Today     Reviewed by Kit Alm LABOR, LCSW (Social Worker) on 11/30/23 at [phone]  Med List Status: <None>   Medication Order Taking? Sig Documenting Provider Last Dose Status Informant  aspirin EC 81 MG tablet [id number]  Take 81 mg by mouth daily. Swallow whole. [provider]  Active   Cyanocobalamin  (VITAMIN B 12 PO) [id number]  Take by mouth. [provider]  Active   donepezil  (ARICEPT ) 10 MG tablet [id number]  Take 1 tablet (10 mg total) by mouth daily. Wertman, Sara E, PA-C  Active  Glucose Blood (BLOOD GLUCOSE TEST STRIPS) STRP [id number]  1 each by In Vitro route daily. May substitute to any manufacturer covered by patient's insurance. Thapa, [place], MD  Active   Lancets Misc. MISC [id number]  1 each by Does not apply route daily. May substitute to any manufacturer covered by patient's insurance. Thapa,  [place], MD  Active   metFORMIN  (GLUCOPHAGE -XR) 500 MG 24 hr tablet [id number]  Take 2 tablets (1,000 mg total) by mouth 2 (two) times daily with a meal. Katrinka Garnette KIDD, MD  Active   Multiple Vitamin (MULTIVITAMIN) capsule [id number]  Take 1 capsule by mouth daily. [provider]  Active   Polyethyl Glycol-Propyl Glycol (SYSTANE ULTRA OP) [id number]  Place 1 drop into both eyes daily as needed (dry eyes). [provider]  Active Self  rosuvastatin  (CRESTOR ) 10 MG tablet [id number]  TAKE ONE TABLET BY MOUTH ONCE A DAY Katrinka Garnette KIDD, MD  Active   sertraline  (ZOLOFT ) 25 MG tablet [id number]  TAKE ONE TABLET BY MOUTH ONCE A DAY Katrinka Garnette KIDD, MD  Active   sitaGLIPtin  (JANUVIA ) 50 MG tablet [id number]  Take 1 tablet (50 mg total) by mouth daily. Thapa, [place], MD  Active             Recommendation:   Continue Current Plan of Care  Follow Up Plan:   Patient has met all care management goals. Care Management case will be closed. Patient has been provided contact information should new needs arise.   Alm Armor, LCSW [place], [facility] Licensed Clinical Social Worker Care Coordinator [phone]

## 2023-11-30 NOTE — Patient Instructions (Signed)
 Visit Information  Thank you for taking time to visit with me today. Please don't hesitate to contact me if I can be of assistance to you before our next scheduled appointment.  Your next care management appointment is no further scheduled appointments.   Please call the care guide team at [phone] if you need to cancel, schedule, or reschedule an appointment.   Please call the Suicide and Crisis Lifeline: 988 if you are experiencing a Mental Health or Behavioral Health Crisis or need someone to talk to.  Hale Level, LCSW [place], [facility] Licensed Clinical Social Worker Care Coordinator [phone]

## 2023-12-04 ENCOUNTER — Ambulatory Visit: Payer: Self-pay

## 2023-12-04 NOTE — Telephone Encounter (Signed)
 FYI Only or Action Required?: FYI only for provider.  Patient was last seen in primary care on 10/10/2023 by Katrinka Garnette KIDD, MD.  Called Nurse Triage reporting Diarrhea.  Symptoms began several weeks ago.  Interventions attempted: Nothing.  Symptoms are: unchanged.  Triage Disposition: See PCP When Office is Open (Within 3 Days)  Patient/caregiver understands and will follow disposition?: Yes  Summary: uncontrollable bowel, movements and along with urination.   patient husband Zell stating patient has had uncontrollable bowel, movements and along with urination. Patient doesn't even know it's happening and it happens mostly at night.   Patient was informed Nurse Triage will be calling back within an hour.         Reason for Disposition  [1] MILD diarrhea (e.g., 1-3 or more stools than normal in past 24 hours) AND [2] present >  7 days  (Exception: Chronic diarrhea that is not worse.)  Answer Assessment - Initial Assessment Questions 1. DIARRHEA SEVERITY: How bad is the diarrhea? How many more stools have you had in the past 24 hours than normal?      About twice per day 2. ONSET: When did the diarrhea begin?      Two weeks 3. STOOL DESCRIPTION:  How loose or watery is the diarrhea? What is the stool color? Is there any blood or mucous in the stool?     Mostly diarrhea 4. VOMITING: Are you also vomiting? If Yes, ask: How many times in the past 24 hours?      denies 5. ABDOMEN PAIN: Are you having any abdomen pain? If Yes, ask: What does it feel like? (e.g., crampy, dull, intermittent, constant)      denies 6. ABDOMEN PAIN SEVERITY: If present, ask: How bad is the pain?  (e.g., Scale 1-10; mild, moderate, or severe)     N/A 7. ORAL INTAKE: If vomiting, Have you been able to drink liquids? How much liquids have you had in the past 24 hours?     N/A 8. HYDRATION: Any signs of dehydration? (e.g., dry mouth [not just dry lips], too weak to stand,  dizziness, new weight loss) When did you last urinate?     Denies 9. EXPOSURE: Have you traveled to a foreign country recently? Have you been exposed to anyone with diarrhea? Could you have eaten any food that was spoiled?     denies 10. ANTIBIOTIC USE: Are you taking antibiotics now or have you taken antibiotics in the past 2 months?       denies 11. OTHER SYMPTOMS: Do you have any other symptoms? (e.g., fever, blood in stool)       denies 12. PREGNANCY: Is there any chance you are pregnant? When was your last menstrual period?       N/A  Protocols used: [facility]

## 2023-12-04 NOTE — Telephone Encounter (Signed)
 Pt seeing Dr. Wendolyn on 12/05/2023. Dr. Katrinka will review triage note.

## 2023-12-05 ENCOUNTER — Ambulatory Visit: Admitting: Family Medicine

## 2023-12-10 ENCOUNTER — Other Ambulatory Visit: Payer: Self-pay | Admitting: Family Medicine

## 2023-12-11 ENCOUNTER — Ambulatory Visit: Payer: Self-pay | Admitting: Family Medicine

## 2023-12-11 ENCOUNTER — Encounter: Payer: Self-pay | Admitting: Family Medicine

## 2023-12-11 ENCOUNTER — Ambulatory Visit: Admitting: Family Medicine

## 2023-12-11 VITALS — BP 138/70 | HR 84 | Temp 97.3°F | Ht 61.0 in | Wt 88.5 lb

## 2023-12-11 DIAGNOSIS — E559 Vitamin D deficiency, unspecified: Secondary | ICD-10-CM | POA: Diagnosis not present

## 2023-12-11 DIAGNOSIS — Z7984 Long term (current) use of oral hypoglycemic drugs: Secondary | ICD-10-CM

## 2023-12-11 DIAGNOSIS — E1165 Type 2 diabetes mellitus with hyperglycemia: Secondary | ICD-10-CM | POA: Diagnosis not present

## 2023-12-11 DIAGNOSIS — F325 Major depressive disorder, single episode, in full remission: Secondary | ICD-10-CM

## 2023-12-11 DIAGNOSIS — E785 Hyperlipidemia, unspecified: Secondary | ICD-10-CM

## 2023-12-11 DIAGNOSIS — R32 Unspecified urinary incontinence: Secondary | ICD-10-CM | POA: Diagnosis not present

## 2023-12-11 DIAGNOSIS — F0390 Unspecified dementia without behavioral disturbance: Secondary | ICD-10-CM

## 2023-12-11 LAB — CBC WITH DIFFERENTIAL/PLATELET
Basophils Absolute: 0.1 K/uL (ref 0.0–0.1)
Basophils Relative: 0.7 % (ref 0.0–3.0)
Eosinophils Absolute: 0.1 K/uL (ref 0.0–0.7)
Eosinophils Relative: 1.1 % (ref 0.0–5.0)
HCT: 37.9 % (ref 36.0–46.0)
Hemoglobin: 12.5 g/dL (ref 12.0–15.0)
Lymphocytes Relative: 23.6 % (ref 12.0–46.0)
Lymphs Abs: 2.1 K/uL (ref 0.7–4.0)
MCHC: 32.9 g/dL (ref 30.0–36.0)
MCV: 87.5 fl (ref 78.0–100.0)
Monocytes Absolute: 0.8 K/uL (ref 0.1–1.0)
Monocytes Relative: 9.2 % (ref 3.0–12.0)
Neutro Abs: 5.7 K/uL (ref 1.4–7.7)
Neutrophils Relative %: 65.4 % (ref 43.0–77.0)
Platelets: 337 K/uL (ref 150.0–400.0)
RBC: 4.33 Mil/uL (ref 3.87–5.11)
RDW: 14.6 % (ref 11.5–15.5)
WBC: 8.7 K/uL (ref 4.0–10.5)

## 2023-12-11 LAB — COMPREHENSIVE METABOLIC PANEL WITH GFR
ALT: 22 U/L (ref 0–35)
AST: 23 U/L (ref 0–37)
Albumin: 4.2 g/dL (ref 3.5–5.2)
Alkaline Phosphatase: 60 U/L (ref 39–117)
BUN: 11 mg/dL (ref 6–23)
CO2: 30 meq/L (ref 19–32)
Calcium: 10.3 mg/dL (ref 8.4–10.5)
Chloride: 101 meq/L (ref 96–112)
Creatinine, Ser: 0.72 mg/dL (ref 0.40–1.20)
GFR: 76.93 mL/min (ref >60.00)
Glucose, Bld: 163 mg/dL — ABNORMAL HIGH (ref 70–99)
Potassium: 3.7 meq/L (ref 3.5–5.1)
Sodium: 140 meq/L (ref 135–145)
Total Bilirubin: 0.5 mg/dL (ref 0.2–1.2)
Total Protein: 7.1 g/dL (ref 6.0–8.3)

## 2023-12-11 LAB — LIPID PANEL
Cholesterol: 140 mg/dL (ref 0–200)
HDL: 65.2 mg/dL (ref >39.00)
LDL Cholesterol: 54 mg/dL (ref 0–99)
NonHDL: 75.15
Total CHOL/HDL Ratio: 2
Triglycerides: 107 mg/dL (ref 0.0–149.0)
VLDL: 21.4 mg/dL (ref 0.0–40.0)

## 2023-12-11 LAB — HEMOGLOBIN A1C: Hgb A1c MFr Bld: 6.9 % — ABNORMAL HIGH (ref 4.6–6.5)

## 2023-12-11 MED ORDER — SERTRALINE HCL 50 MG PO TABS: 50.0000 mg | ORAL_TABLET | Freq: Every day | ORAL | 3 refills | Status: AC

## 2023-12-11 NOTE — Patient Instructions (Addendum)
 Increase sertraline  from 25 to 50 mg to help with mood  With ongoing diarrhea- short term trial off metformin  for 1 week then restart with just 1 tablet a day if diarrhea resolves completely then update me another week later. Maybe worth checking sugar once a week in the mornings before meals. Update me in 2 weeks by mychart or call.   Please stop by lab before you go If you have mychart- we will send your results within 3 business days of us  receiving them.  If you do not have mychart- we will call you about results within 5 business days of us  receiving them.  *please also note that you will see labs on mychart as soon as they post. I will later go in and write notes on them- will say notes from Dr. Katrinka   Recommended follow up: Return in about 2 months (around 02/10/2024) for followup or sooner if needed.Schedule b4 you leave.

## 2023-12-11 NOTE — Progress Notes (Signed)
 Phone [phone] In person visit   Subjective:   Gabrielle Robinson is a 83 y.o. year old very pleasant female patient who presents for/with See problem oriented charting Chief Complaint  Patient presents with   Diabetes    Follow up   Major neurocognitive disorder    Pt would like to discuss further.   Bleeding/Bruising    Pt c/o bilateral leg bruising.     Past Medical History-  Patient Active Problem List   Diagnosis Date Noted   History of cerebral infarction 05/12/2022    Priority: High   Major neurocognitive disorder (HCC) 05/12/2022    Priority: High   Diabetes mellitus (HCC) 04/01/2013    Priority: High   Vitamin D  deficiency 10/22/2021    Priority: Medium    Atherosclerosis of aorta (HCC) 07/27/2021    Priority: Medium    History of melanoma 07/27/2021    Priority: Medium    Major depressive disorder with single episode, in full remission (HCC) 05/28/2015    Priority: Medium    Hyperlipemia 01/05/2009    Priority: Medium    Bilateral shoulder pain 05/28/2015    Priority: Low    Medications- reviewed and updated Current Outpatient Medications  Medication Sig Dispense Refill   aspirin EC 81 MG tablet Take 81 mg by mouth daily. Swallow whole.     Cyanocobalamin  (VITAMIN B 12 PO) Take by mouth.     donepezil  (ARICEPT ) 10 MG tablet Take 1 tablet (10 mg total) by mouth daily. 90 tablet 3   metFORMIN  (GLUCOPHAGE -XR) 500 MG 24 hr tablet Take 2 tablets (1,000 mg total) by mouth 2 (two) times daily with a meal. 360 tablet 3   Multiple Vitamin (MULTIVITAMIN) capsule Take 1 capsule by mouth daily.     rosuvastatin  (CRESTOR ) 10 MG tablet TAKE ONE TABLET BY MOUTH ONCE A DAY 90 tablet 0   sertraline  (ZOLOFT ) 50 MG tablet Take 1 tablet (50 mg total) by mouth daily. 90 tablet 3   sitaGLIPtin  (JANUVIA ) 50 MG tablet Take 1 tablet (50 mg total) by mouth daily. 90 tablet 3   Glucose Blood (BLOOD GLUCOSE TEST STRIPS) STRP 1 each by In Vitro route daily. May substitute to any  manufacturer covered by patient's insurance. 90 each 3   Lancets Misc. MISC 1 each by Does not apply route daily. May substitute to any manufacturer covered by patient's insurance. 90 each 3   Polyethyl Glycol-Propyl Glycol (SYSTANE ULTRA OP) Place 1 drop into both eyes daily as needed (dry eyes).     No current facility-administered medications for this visit.     Objective:  BP 138/70   Pulse 84   Temp (!) 97.3 F (36.3 C) (Temporal)   Ht 5' 1 (1.549 m)   Wt 88 lb 8 oz (40.1 kg)   SpO2 97%   BMI 16.72 kg/m  Gen: NAD, resting comfortably CV: RRR no murmurs rubs or gallops Lungs: CTAB no crackles, wheeze, rhonchi Ext: no edema Skin: warm, dry Neuro: Looks to son for many answers, hard of hearing, visibly confused at times    Assessment and Plan   #major neurocognitive disorder- sees neurology [place] S:Medication: donepazil 10 mg - Certainly has behavioral disturbance associated with this-has become increasingly irritable.  In the past had some diarrhea on sertraline  and is having fecal incontinence/loose stools-i also some hesitancy to increase but I think we need to try to address this and also wonder if decreasing metformin  may help A/P: Dementia noted with behavioral disturbance-continue donepezil   and increase sertraline  from 25 to 50 mg -Previously with urinary incontinence have considered brain MRI to reconsider diagnosis of normal pressure hydrocephalus but they have opted out-can get neurology's impression at December visit - Also wonder if she may at 1 point benefit from something like Rexulti if cost effective -Going weight loss-wonder if reducing metformin  and hopefully last incontinence/loose stools may help as well  # Diabetes- sees Dr. Mercie- peak a1c of 10.7 in November 2022 off meds S: Medication:Metformin  1000 mg extended release  twice daily , januvia  50 mg added November 2024  Lab Results  Component Value Date   HGBA1C 7.2 (H) 08/31/2023   HGBA1C 7.1 (A)  06/07/2023   HGBA1C 8.8 (H) 03/28/2023   A/P: Diabetes has been well-controlled with Dr. Eugenio help-we need to get her back in there eventually but right now with diarrhea going to try off metformin  for a week then restart 1 tablet a day and have him update me in 2 weeks.  Continue Januvia   #History of lacunar infarct #hyperlipidemia #aortic atherosclerosis - LDL goal 70 S: Medication:Pravastatin  40 mg--> rosuvastatin  10 mg, aspirin 81 mg  Lab Results  Component Value Date   CHOL 153 09/23/2022   HDL 68.90 09/23/2022   LDLCALC 64 09/23/2022   LDLDIRECT 93 01/28/2022   TRIG 103.0 09/23/2022   CHOLHDL 2 09/23/2022   A/P: Lipids at goal but overdue for recheck-update today.  Continue aspirin with stroke history   # anxiety/Depression S: Medication: Sertraline  25 mg - Increased irritability with dementia A/P: Anxiety and depression or could call this dementia with behavioral disturbance poorly controlled-increase to 50 mg and recheck 2 months  #Vitamin D  deficiency S: Medication: d3 1000 units Previously treated with high-dose vitamin D  50,000 units for 12 weeks in past Last vitamin D  Lab Results  Component Value Date   VD25OH 50.56 09/23/2022   A/P: Well-controlled last year-update with labs  # Bruising over bilateral shins-has purplish areas that are nonblanching with some surrounding yellow concerning for bruising.  Also check CBC but suspect trauma related over purpura-they also plan to schedule visit with dermatology  Recommended follow up: Return in about 2 months (around 02/10/2024) for followup or sooner if needed.Schedule b4 you leave. Future Appointments  Date Time Provider Department Center  03/19/2024  3:00 PM Dina Camie FORBES DEVON[facility] None  04/11/2024  2:00 PM Katrinka Garnette KIDD, MD [facility]  04/15/2024  3:00 PM [facility] ANNUAL WELLNESS VISIT 1 [facility]  05/30/2024 10:00 AM [facility] None  05/30/2024 10:30 AM Sherrod Sherrod, MD [facility] None    Lab/Order associations: NOT fasting   ICD-10-CM   1. Urinary incontinence, unspecified type  R32 Urine Culture    2. Type 2 diabetes mellitus with hyperglycemia, without long-term current use of insulin (HCC)  E11.65 Urine Albumin/Creatinine with ratio (send out) [[id number]]    Comprehensive metabolic panel with GFR    CBC with Differential/Platelet    Hemoglobin A1c    Lipid panel    3. Vitamin D  deficiency  E55.9 Vitamin D  (25 hydroxy)      Meds ordered this encounter  Medications   sertraline  (ZOLOFT ) 50 MG tablet    Sig: Take 1 tablet (50 mg total) by mouth daily.    Dispense:  90 tablet    Refill:  3    Return precautions advised.  Garnette Katrinka, MD

## 2023-12-20 NOTE — Progress Notes (Signed)
 Sent this to [facility] so we can figure out why it still says collected back from July.

## 2023-12-20 NOTE — Progress Notes (Signed)
 Gabrielle Robinson                                          MRN: [id number]   12/20/2023   The [facility] Quality Team Specialist reviewed this patient medical record for the purposes of chart review for care gap closure. The following were reviewed: chart review for care gap closure-kidney health evaluation for diabetes:eGFR  and uACR.    [facility] Quality Team

## 2024-02-07 ENCOUNTER — Telehealth: Payer: Self-pay | Admitting: Family Medicine

## 2024-02-07 NOTE — Telephone Encounter (Signed)
 Please see patient message and advise.   Copied from CRM [id number]. Topic: General - Other >> Feb 07, 2024  3:11 PM Rosina BIRCH wrote: Reason for CRM: karen from [facility] called stating the patient has severe dementia and could not walk in the last week. She becomes combative with personal care and the family want to talk the doctor to see what medications is essential for her. The patient is on Asprin and is having a lot bruising where she is bumping herself. Darice stated she talked about cholesterol and taking the patient off the vitamin, Asprin and rosuvastatin  if the provider agrees. Darice recommend the family to talk to the doctor about palliative care because it is getting to a point where they may need placement for her [phone]

## 2024-02-08 NOTE — Telephone Encounter (Signed)
 Spoke with Gabrielle Robinson and relayed provider remarks and recommendations. No further questions at this time. Patient will be at appt on 11/17.

## 2024-02-08 NOTE — Telephone Encounter (Signed)
 With history of stroke hard to recommend no aspirin and statin- we can discuss further on the 17th at her appointment- I like the palliative consult if they will accept ([facility] that is)

## 2024-02-12 ENCOUNTER — Ambulatory Visit: Admitting: Family Medicine

## 2024-02-26 ENCOUNTER — Other Ambulatory Visit: Payer: Self-pay | Admitting: Family Medicine

## 2024-03-04 DIAGNOSIS — Z85828 Personal history of other malignant neoplasm of skin: Secondary | ICD-10-CM | POA: Diagnosis not present

## 2024-03-04 DIAGNOSIS — M7981 Nontraumatic hematoma of soft tissue: Secondary | ICD-10-CM | POA: Diagnosis not present

## 2024-03-04 DIAGNOSIS — L853 Xerosis cutis: Secondary | ICD-10-CM | POA: Diagnosis not present

## 2024-03-04 DIAGNOSIS — Z85820 Personal history of malignant melanoma of skin: Secondary | ICD-10-CM | POA: Diagnosis not present

## 2024-03-07 ENCOUNTER — Ambulatory Visit: Admitting: Family Medicine

## 2024-03-18 NOTE — Progress Notes (Signed)
 "     Gabrielle Robinson is a very pleasant 83 y.o. RH female with a history ofhypertension, hyperlipidemia, DM2, history of depression, vitamin D  deficiency, history of melanoma, B12 deficiency, chronic shoulder pain, history of CVA seen today in follow up for memory loss. Patient is currently on donepezil  10 mg daily as per PCP***.  This patient is accompanied in the office by her son*** whhypertension, hyperlipidemia, DM2, history of depression, vitamin D  deficiency, history of melanoma, B12 deficiency, chronic shoulder pain, history of CVA o supplements the history.  Previous records as well as any outside records available were reviewed prior to todays visit. Patient was last seen on 09/21/2023***. Memory is **. MMSE today is  /30. Patient is able to participate on ADLs.  She continues to drive, no further driving is recommended for safety.  Mood is more irritable, PCP recently increased Zoloft , they are entertaining Rexulti.  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  History of Present Illness     Any changes in memory since last visit?  Short-term memory is worse .  She has difficulty remembering recent conversations and names, dates and times.   However she speaks on complete sentences per son's report. She does not read as extensively as she did before LTM is good, but her concept of time is may be affected, as  before. repeats oneself?  Endorsed Disoriented when walking into a room? Denies    Leaving objects?  May misplace things but not in unusual places   Wandering behavior?  denies   Any personality changes since last visit? She is preoccupied about her husband who is hospitalized for stroke.  As before, she can become frustrated and defensive at times due to memory issues. Any worsening depression?:  Denies.   Hallucinations or paranoia?  Denies.   Seizures? denies    Any sleep changes?  Sleeps well.  Denies vivid dreams, REM behavior or sleepwalking   Sleep apnea?   Denies.   Any hygiene concerns?  She needs to be reminded.  She is not interested in showering. Independent of bathing and dressing?  Endorsed  Does the patient needs help with medications?  Husband is in charge   Who is in charge of the finances?  Husband is in charge     Any changes in appetite?  denies.  She always ate 2 meals a day.  She likes sweets such as ice cream    Patient have trouble swallowing? Denies.   Does the patient cook? No Any headaches?   denies   Any vision changes? Denies  Chronic back pain  denies   Ambulates with difficulty?  Her walking is slower than before   Recent falls or head injuries?  On 09/11/2023 the patient sustained a mechanical fall, no loss of consciousness, with a hematoma on the scalp.  No LOC.  Negative CT of the head and neck.  No fractures Unilateral weakness, numbness or tingling?  denies   Any tremors?  Denies    Any anosmia?  Denies   Any incontinence of urine?  Endorsed .   Any bowel dysfunction?   Intermittent diarrhea, now with bowel incontinence     Patient lives with her husband    Does the patient drive yes, but not as often as before.  Denies getting lost     MRI brain from January 2024 personally reviewed was remarkable for mild chronic small vessel ischemic changes within the cerebral white matter, chronic lacunar infarct within the left basal ganglia, moderate to  advanced generalized cerebral atrophy, mild cerebellar atrophy     Initial visit on 05/17/2022 How long did patient have memory difficulties? She denies any memory issues. Husband noticed some slight memory issues 2 months ago, confusion about dates and times and some difficulty remembering recent conversations and people names  repeats oneself? Denies  Disoriented when walking into a room?  Patient denies Leaving objects in unusual places?  Endorsed, but not in unusual places   Wandering behavior? denies   Any personality changes since last visit? She gets discouraged when solving an issue, but nothing radical Any worsening depression?:  denies   Hallucinations or paranoia?  denies   Seizures?   denies    Any sleep changes?  Denies  vivid dreams, REM behavior or sleepwalking   Sleep apnea? denies   Any hygiene concerns?  denies   Independent of bathing and dressing?  Endorsed  Does the patient need help with medications? Husband is in charge   Who is in charge of the finances? Husband  is in charge     Any changes in appetite?  Both eat 2 meals, not too hungry    Patient have trouble swallowing?  Denies   Does the patient cook? No  Any kitchen accidents such as leaving the stove on? Patient denies   Any headaches?  Denies   Chronic back pain  Denies  I never had pain Ambulates with difficulty?   Denies   Recent falls or head injuries?  denies     Unilateral weakness, numbness or tingling?  denies   Any tremors?  denies   Any anosmia?  denies   Any incontinence of urine?  denies   Any bowel dysfunction?  Intermittent diarrhea.  Patient lives  with her husband  History of heavy alcohol intake?glass or two of wine at night, maybe a little more but not overindulgent History of heavy tobacco use?   denies   Family history of dementia?  Denies  Dose patient drive? She had one episode where she missed a road or two Retired runner, broadcasting/film/video. College degree             04/11/2023     3:09 PM 03/16/2023    3:00 PM 08/23/2022    4:00 PM  MMSE - Mini Mental State Exam  Not completed: Unable to complete    Orientation to time  3 4  Orientation to Place  5 5  Registration  3 3  Attention/ Calculation  2 0  Recall  1 0  Language- name 2 objects  2 1  Language- repeat  1 1  Language- follow 3 step command  3 3  Language- read & follow direction  1 1  Write a sentence  1 1  Copy design  0 0  Total score  22 19      05/18/2022   10:00 AM 05/16/2022    7:00 PM  [facility]  Cognitive Assessment   Visuospatial/ Executive (0/5) 2 1  Naming (0/3) 3 3  Attention: Read list of digits (0/2) 2 2  Attention: Read list of letters (0/1) 1 1  Attention: Serial 7 subtraction starting at 100 (0/3) 1 1  Language: Repeat phrase (0/2) 1 1  Language : Fluency (0/1) 1 1  Abstraction (0/2) 1 1  Delayed Recall (0/5) 1 1  Orientation (0/6) 5 6  Total 18 18  Adjusted Score (based on education) 18 18      Objective:    Neurological Exam:    VITALS:  There were no vitals filed for this visit.  GEN:  The patient appears stated age and is in NAD. HEENT:  Normocephalic, atraumatic.   Neurological examination:  General: NAD, well-groomed, appears stated age. Orientation: The patient is alert. Oriented to person, place and date Cranial nerves: There is good facial symmetry.The speech is fluent and clear. No aphasia or dysarthria. Fund of knowledge is appropriate. Recent and remote memory are impaired. Attention and concentration are reduced. Able to name objects and repeat phrases.  Hearing is intact to conversational tone. *** Sensation: Sensation is intact to light touch throughout Motor: Strength is at least antigravity x4. DTR's 2/4 in UE/LE     Movement examination:  Tone: There is normal tone in the UE/LE Abnormal movements:  no tremor.  No myoclonus.  No asterixis.   Coordination:  There is no decremation with RAM's. Normal finger to nose  Gait and Station: The patient  has no*** difficulty arising out of a deep-seated chair without the use of the hands. The patient's stride length is good.  Gait is cautious and narrow.    Thank you for allowing us  the opportunity to participate in the care of this nice patient. Please do not hesitate to contact us  for any questions or concerns.   Total time spent on today's visit was *** minutes dedicated to this patient today, preparing to see patient, examining the patient, ordering tests and/or medications and counseling the patient, documenting clinical information in the EHR or other health record, independently interpreting results and communicating results to the patient/family, discussing treatment and goals, answering patient's questions and coordinating care.  Cc:  Katrinka Garnette KIDD, MD  Camie Sevin 03/18/2024 6:01 AM      "

## 2024-03-19 ENCOUNTER — Encounter: Payer: Self-pay | Admitting: Physician Assistant

## 2024-03-19 ENCOUNTER — Ambulatory Visit: Admitting: Physician Assistant

## 2024-03-19 VITALS — BP 147/80 | HR 94 | Resp 20 | Ht 61.0 in

## 2024-03-19 DIAGNOSIS — F0390 Unspecified dementia without behavioral disturbance: Secondary | ICD-10-CM | POA: Diagnosis not present

## 2024-03-19 MED ORDER — MEMANTINE HCL 5 MG PO TABS: ORAL_TABLET | ORAL | 11 refills | Status: AC

## 2024-04-04 ENCOUNTER — Encounter: Payer: Self-pay | Admitting: Family Medicine

## 2024-04-04 ENCOUNTER — Ambulatory Visit: Admitting: Family Medicine

## 2024-04-04 VITALS — BP 118/68 | HR 113 | Temp 98.0°F | Ht 61.0 in | Wt 81.4 lb

## 2024-04-04 DIAGNOSIS — Z7984 Long term (current) use of oral hypoglycemic drugs: Secondary | ICD-10-CM

## 2024-04-04 DIAGNOSIS — E559 Vitamin D deficiency, unspecified: Secondary | ICD-10-CM | POA: Diagnosis not present

## 2024-04-04 DIAGNOSIS — F0390 Unspecified dementia without behavioral disturbance: Secondary | ICD-10-CM | POA: Diagnosis not present

## 2024-04-04 DIAGNOSIS — E1169 Type 2 diabetes mellitus with other specified complication: Secondary | ICD-10-CM | POA: Diagnosis not present

## 2024-04-04 DIAGNOSIS — E785 Hyperlipidemia, unspecified: Secondary | ICD-10-CM | POA: Diagnosis not present

## 2024-04-04 DIAGNOSIS — Z23 Encounter for immunization: Secondary | ICD-10-CM

## 2024-04-04 NOTE — Progress Notes (Signed)
 " Phone [phone] In person visit   Subjective:   Gabrielle Robinson is a 84 y.o. year old very pleasant female patient who presents for/with See problem oriented charting Chief Complaint  Patient presents with   Medical Management of Chronic Issues    2 month follow up; mood has gotten better since neurologist went up to 50mg  of sertraline ;    Diabetes    Past Medical History-  Patient Active Problem List   Diagnosis Date Noted   History of cerebral infarction 05/12/2022    Priority: High   Major neurocognitive disorder (HCC) 05/12/2022    Priority: High   Diabetes mellitus (HCC) 04/01/2013    Priority: High   Vitamin D  deficiency 10/22/2021    Priority: Medium    Atherosclerosis of aorta 07/27/2021    Priority: Medium    History of melanoma 07/27/2021    Priority: Medium    Major depressive disorder with single episode, in full remission 05/28/2015    Priority: Medium    Hyperlipemia 01/05/2009    Priority: Medium    Bilateral shoulder pain 05/28/2015    Priority: Low    Medications- reviewed and updated Current Outpatient Medications  Medication Sig Dispense Refill   aspirin EC 81 MG tablet Take 81 mg by mouth daily. Swallow whole.     Cyanocobalamin  (VITAMIN B 12 PO) Take by mouth.     donepezil  (ARICEPT ) 10 MG tablet Take 1 tablet (10 mg total) by mouth daily. 90 tablet 3   Glucose Blood (BLOOD GLUCOSE TEST STRIPS) STRP 1 each by In Vitro route daily. May substitute to any manufacturer covered by patient's insurance. 90 each 3   Lancets Misc. MISC 1 each by Does not apply route daily. May substitute to any manufacturer covered by patient's insurance. 90 each 3   memantine  (NAMENDA ) 5 MG tablet Take 1 tablet (5 mg at night) for 2 weeks, then increase to 1 tablet (5 mg) twice a day 60 tablet 11   metFORMIN  (GLUCOPHAGE -XR) 500 MG 24 hr tablet Take 2 tablets (1,000 mg total) by mouth 2 (two) times daily with a meal. 360 tablet 3   Multiple Vitamin (MULTIVITAMIN) capsule  Take 1 capsule by mouth daily.     Polyethyl Glycol-Propyl Glycol (SYSTANE ULTRA OP) Place 1 drop into both eyes daily as needed (dry eyes).     rosuvastatin  (CRESTOR ) 10 MG tablet TAKE ONE TABLET BY MOUTH ONCE A DAY 90 tablet 0   sertraline  (ZOLOFT ) 50 MG tablet Take 1 tablet (50 mg total) by mouth daily. 90 tablet 3   sitaGLIPtin  (JANUVIA ) 50 MG tablet Take 1 tablet (50 mg total) by mouth daily. 90 tablet 3   No current facility-administered medications for this visit.     Objective:  BP 118/68 (BP Location: Left Arm, Patient Position: Sitting, Cuff Size: Normal)   Pulse (!) 113   Temp 98 F (36.7 C) (Temporal)   Ht 5' 1 (1.549 m)   Wt 81 lb 6.4 oz (36.9 kg)   SpO2 98%   BMI 15.38 kg/m  Gen: NAD, resting comfortably CV: RRR no murmurs rubs or gallops Lungs: CTAB no crackles, wheeze, rhonchi Ext: no edema Skin: warm, dry  Diabetic foot exam was performed with the following findings:   No deformities, ulcerations, or other skin breakdown Normal sensation of 10g monofilament Intact posterior tibialis and dorsalis pedis pulses         Assessment and Plan    #health maintenance- flu shot today   #weight  loss- weight down 7 lbs from last visit and 12 lbs since July. Eats regularly but small volume. Some grazing. Would encourage protein shake like glucerna Wt Readings from Last 3 Encounters:  04/04/24 81 lb 6.4 oz (36.9 kg)  12/11/23 88 lb 8 oz (40.1 kg)  10/10/23 93 lb (42.2 kg)   #major neurocognitive disorder- sees neurology [place] S:Medication: donepazil 5 mg--> 10 mg, memantine  5 mg twice daily . Less agitation and hallucinations on sertraline  50 mg -January 2024 MRI-noted prior lacunar infarct-now on aspirin A/P: noted and ongoing worsening- suspect contributing to weight loss.    # Diabetes- sees Dr. Mercie- peak a1c of 10.7 in November 2022 off meds S: Medication:Metformin  1000 mg extended release  twice daily , januvia  50 mg added November 2024. No increased  urination known but incontinent so hard to tell -prior significant weight loss/gastrointestinal distress on Rybelsus  14 mg- now off - avoid shots per patient  Lab Results  Component Value Date   HGBA1C 6.9 (H) 12/11/2023   HGBA1C 7.2 (H) 08/31/2023   HGBA1C 7.1 (A) 06/07/2023   A/P: hopefully stable- update a1c today. Continue current meds for now - may try to reduce metformin  or januvia  depending on results with weight loss  #History of lacunar infarct #hyperlipidemia #aortic atherosclerosis - LDL goal 70 S: Medication:Pravastatin  40 mg--> rosuvastatin  10 mg, aspirin 81 mg Lab Results  Component Value Date   CHOL 140 12/11/2023   HDL 65.20 12/11/2023   LDLCALC 54 12/11/2023   LDLDIRECT 93 01/28/2022   TRIG 107.0 12/11/2023   CHOLHDL 2 12/11/2023  A/P: lipids at goal for stroke history- continue current medications    # anxiety/Depression S: Medication: sertraline  50 mg no known issues with diarrhea like previously.  A/P: reasonable control- continue current medications   #Vitamin D  deficiency S: Medication: d3 1000 units thinks in multivitamin  Previously treated with high-dose vitamin D  50,000 units for 12 weeks in past Last vitamin D  Lab Results  Component Value Date   VD25OH 50.56 09/23/2022   A/P: hopefully stable- update current meds today. 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 Sherrod- has follow up in march scheduled -also has [facility] dermatology and good report Dr. Bard Molt - in regards to prior weight loss she does thankfully have cT cap on hand form June 2025 without obvious cancer- although did have some esophageal wall thickening concerning for esophagiaits- no mention cancer  Recommended follow up: Return in about 14 weeks (around 07/11/2024) for followup or sooner if needed.Schedule b4 you leave. Future Appointments  Date  Time Provider Department Center  04/11/2024  2:00 PM Katrinka Garnette KIDD, MD [facility]  04/15/2024  3:00 PM [facility] ANNUAL WELLNESS VISIT 1 [facility]  05/30/2024 10:00 AM [facility] None  05/30/2024 10:30 AM Sherrod Sherrod, MD [facility] None  09/17/2024  3:30 PM Dina, Camie BRAVO, PA-C [facility] None    Lab/Order associations:   ICD-10-CM   1. Hyperlipidemia, unspecified hyperlipidemia type  E78.5 TSH    2. Major neurocognitive disorder (HCC)  F03.90     3. Type 2 diabetes mellitus with other specified complication, without long-term current use of insulin (HCC)  E11.69 Comprehensive metabolic panel with GFR    CBC with Differential/Platelet    Hemoglobin A1c    Microalbumin / creatinine urine ratio    4.  Vitamin D  deficiency  E55.9 VITAMIN D  25 Hydroxy (Vit-D Deficiency, Fractures)      No orders of the defined types were placed in this encounter.   Return precautions advised.  Garnette Lukes, MD  "

## 2024-04-04 NOTE — Addendum Note (Signed)
 Addended by: Hassel Uphoff on: 04/04/2024 03:45 PM   Modules accepted: Orders

## 2024-04-04 NOTE — Patient Instructions (Addendum)
 Flu shot today  Consider glucerna- really want to avoid further weight loss- may even pull back on januvia  or metformin  depending on results  Please stop by lab before you go- can take cup for urine if possible If you have mychart- we will send your results within 3 business days of us  receiving them.  If you do not have mychart- we will call you about results within 5 business days of us  receiving them.  *please also note that you will see labs on mychart as soon as they post. I will later go in and write notes on them- will say notes from Dr. Katrinka   Recommended follow up: Return in about 14 weeks (around 07/11/2024) for followup or sooner if needed.Schedule b4 you leave. Sooner if needed if more weight loss

## 2024-04-05 ENCOUNTER — Ambulatory Visit: Admitting: Family Medicine

## 2024-04-05 ENCOUNTER — Ambulatory Visit: Payer: Self-pay | Admitting: Family Medicine

## 2024-04-05 LAB — CBC WITH DIFFERENTIAL/PLATELET
Basophils Absolute: 0.1 K/uL (ref 0.0–0.1)
Basophils Relative: 1 % (ref 0.0–3.0)
Eosinophils Absolute: 0.1 K/uL (ref 0.0–0.7)
Eosinophils Relative: 1.2 % (ref 0.0–5.0)
HCT: 39.3 % (ref 36.0–46.0)
Hemoglobin: 13 g/dL (ref 12.0–15.0)
Lymphocytes Relative: 19.2 % (ref 12.0–46.0)
Lymphs Abs: 1.7 K/uL (ref 0.7–4.0)
MCHC: 33.1 g/dL (ref 30.0–36.0)
MCV: 87.6 fl (ref 78.0–100.0)
Monocytes Absolute: 0.6 K/uL (ref 0.1–1.0)
Monocytes Relative: 7.2 % (ref 3.0–12.0)
Neutro Abs: 6.3 K/uL (ref 1.4–7.7)
Neutrophils Relative %: 71.4 % (ref 43.0–77.0)
Platelets: 356 K/uL (ref 150.0–400.0)
RBC: 4.49 Mil/uL (ref 3.87–5.11)
RDW: 14.1 % (ref 11.5–15.5)
WBC: 8.9 K/uL (ref 4.0–10.5)

## 2024-04-05 LAB — COMPREHENSIVE METABOLIC PANEL WITH GFR
ALT: 67 U/L — ABNORMAL HIGH (ref 3–35)
AST: 76 U/L — ABNORMAL HIGH (ref 5–37)
Albumin: 3.9 g/dL (ref 3.5–5.2)
Alkaline Phosphatase: 60 U/L (ref 39–117)
BUN: 16 mg/dL (ref 6–23)
CO2: 31 meq/L (ref 19–32)
Calcium: 9.9 mg/dL (ref 8.4–10.5)
Chloride: 101 meq/L (ref 96–112)
Creatinine, Ser: 0.83 mg/dL (ref 0.40–1.20)
GFR: 64.72 mL/min
Glucose, Bld: 142 mg/dL — ABNORMAL HIGH (ref 70–99)
Potassium: 4.2 meq/L (ref 3.5–5.1)
Sodium: 141 meq/L (ref 135–145)
Total Bilirubin: 0.4 mg/dL (ref 0.2–1.2)
Total Protein: 6.7 g/dL (ref 6.0–8.3)

## 2024-04-05 LAB — HEMOGLOBIN A1C: Hgb A1c MFr Bld: 6.9 % — ABNORMAL HIGH (ref 4.6–6.5)

## 2024-04-05 LAB — TSH: TSH: 1.7 u[IU]/mL (ref 0.35–5.50)

## 2024-04-05 LAB — VITAMIN D 25 HYDROXY (VIT D DEFICIENCY, FRACTURES): VITD: 42.28 ng/mL (ref 30.00–100.00)

## 2024-04-11 ENCOUNTER — Encounter: Admitting: Family Medicine

## 2024-04-15 ENCOUNTER — Ambulatory Visit: Payer: Medicare PPO

## 2024-05-30 ENCOUNTER — Other Ambulatory Visit

## 2024-05-30 ENCOUNTER — Ambulatory Visit: Admitting: Internal Medicine

## 2024-07-11 ENCOUNTER — Ambulatory Visit: Admitting: Family Medicine

## 2024-09-17 ENCOUNTER — Ambulatory Visit: Payer: Self-pay | Admitting: Physician Assistant
